# Patient Record
Sex: Female | Born: 1955 | Race: White | Hispanic: No | Smoking: Current every day smoker
Health system: Southern US, Community
[De-identification: ages and names within clinical notes are randomized; demographics above are authoritative.]

## PROBLEM LIST (undated history)

## (undated) DIAGNOSIS — J449 Chronic obstructive pulmonary disease, unspecified: Secondary | ICD-10-CM

## (undated) DIAGNOSIS — E785 Hyperlipidemia, unspecified: Secondary | ICD-10-CM

## (undated) DIAGNOSIS — U071 COVID-19: Secondary | ICD-10-CM

## (undated) DIAGNOSIS — Z8489 Family history of other specified conditions: Secondary | ICD-10-CM

## (undated) DIAGNOSIS — J45909 Unspecified asthma, uncomplicated: Secondary | ICD-10-CM

## (undated) DIAGNOSIS — G709 Myoneural disorder, unspecified: Secondary | ICD-10-CM

## (undated) DIAGNOSIS — I10 Essential (primary) hypertension: Secondary | ICD-10-CM

## (undated) DIAGNOSIS — F419 Anxiety disorder, unspecified: Secondary | ICD-10-CM

## (undated) DIAGNOSIS — F410 Panic disorder [episodic paroxysmal anxiety] without agoraphobia: Secondary | ICD-10-CM

## (undated) DIAGNOSIS — F32A Depression, unspecified: Secondary | ICD-10-CM

## (undated) DIAGNOSIS — M542 Cervicalgia: Secondary | ICD-10-CM

## (undated) DIAGNOSIS — K859 Acute pancreatitis without necrosis or infection, unspecified: Secondary | ICD-10-CM

## (undated) DIAGNOSIS — M1990 Unspecified osteoarthritis, unspecified site: Secondary | ICD-10-CM

## (undated) DIAGNOSIS — K219 Gastro-esophageal reflux disease without esophagitis: Secondary | ICD-10-CM

## (undated) HISTORY — PX: TUBAL LIGATION: SHX77

## (undated) HISTORY — DX: COVID-19: U07.1

## (undated) HISTORY — DX: Unspecified asthma, uncomplicated: J45.909

## (undated) HISTORY — DX: Anxiety disorder, unspecified: F41.9

## (undated) HISTORY — DX: Cervicalgia: M54.2

## (undated) HISTORY — PX: ABDOMINAL HYSTERECTOMY: SHX81

## (undated) HISTORY — DX: Hyperlipidemia, unspecified: E78.5

---

## 2001-02-17 ENCOUNTER — Emergency Department (HOSPITAL_COMMUNITY): Admission: EM | Admit: 2001-02-17 | Discharge: 2001-02-17 | Payer: Self-pay | Admitting: *Deleted

## 2001-02-17 ENCOUNTER — Encounter: Payer: Self-pay | Admitting: *Deleted

## 2002-01-06 DIAGNOSIS — Z90710 Acquired absence of both cervix and uterus: Secondary | ICD-10-CM

## 2002-01-06 HISTORY — DX: Acquired absence of both cervix and uterus: Z90.710

## 2004-03-29 ENCOUNTER — Ambulatory Visit: Payer: Self-pay | Admitting: Family Medicine

## 2004-04-23 ENCOUNTER — Ambulatory Visit: Payer: Self-pay | Admitting: Family Medicine

## 2004-05-07 ENCOUNTER — Ambulatory Visit: Payer: Self-pay | Admitting: Family Medicine

## 2004-11-04 ENCOUNTER — Ambulatory Visit: Payer: Self-pay | Admitting: General Surgery

## 2005-05-08 ENCOUNTER — Ambulatory Visit: Payer: Self-pay | Admitting: General Surgery

## 2005-07-23 IMAGING — US ULTRASOUND RIGHT BREAST
1 series · 17 of 25 positions shown · non-contrast
Comparison: none

REASON FOR EXAM: DENSITY   SEND REPORT TO [facility]
COMMENTS:

[Series 1: ultrasound right breast · 17 of 25 slices shown]
[im 1/25]
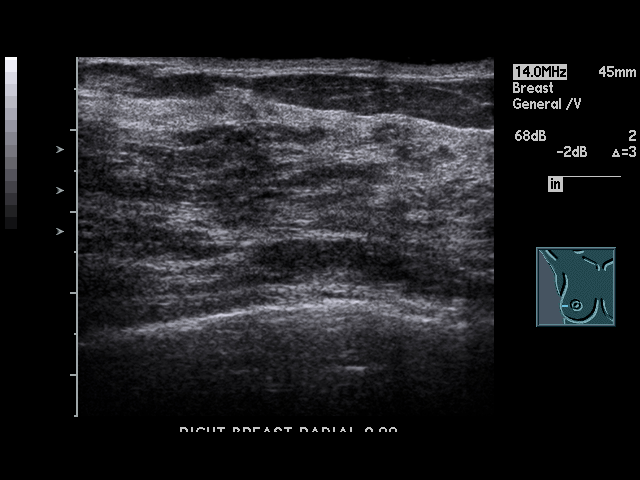
[im 3/25]
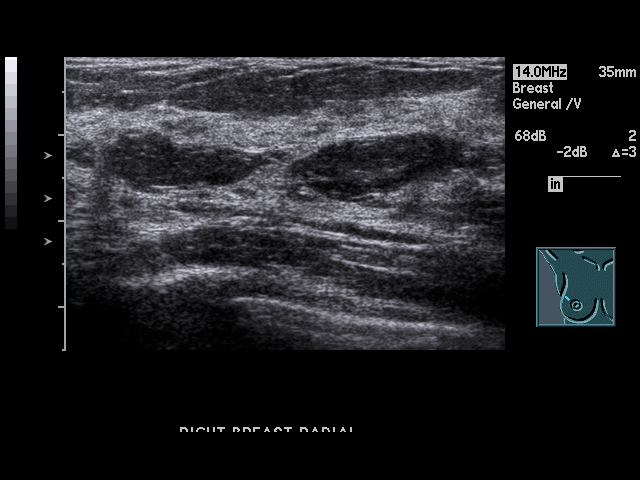
[im 4/25]
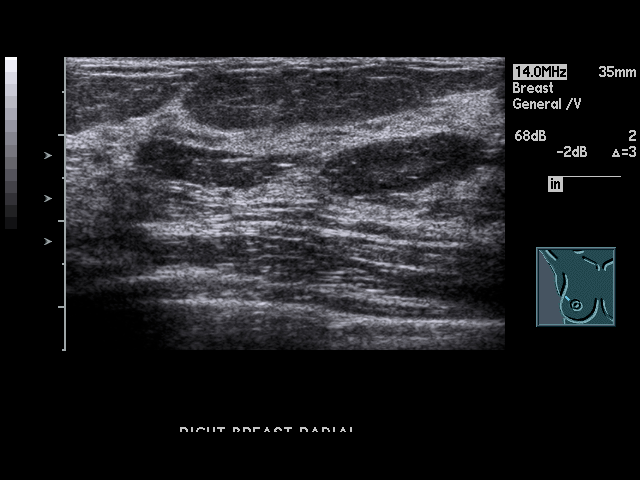
[im 6/25]
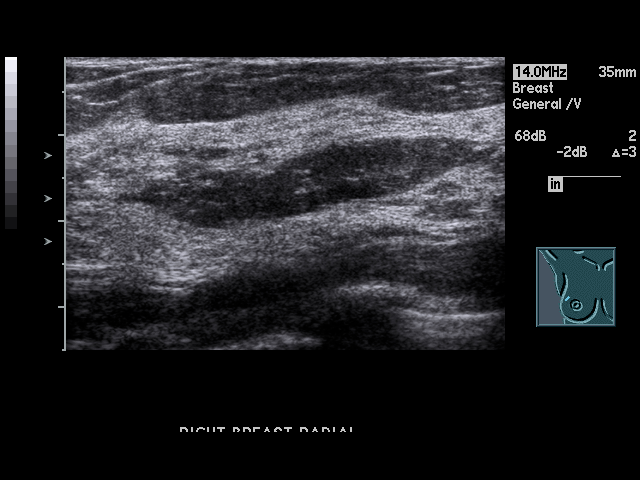
[im 7/25]
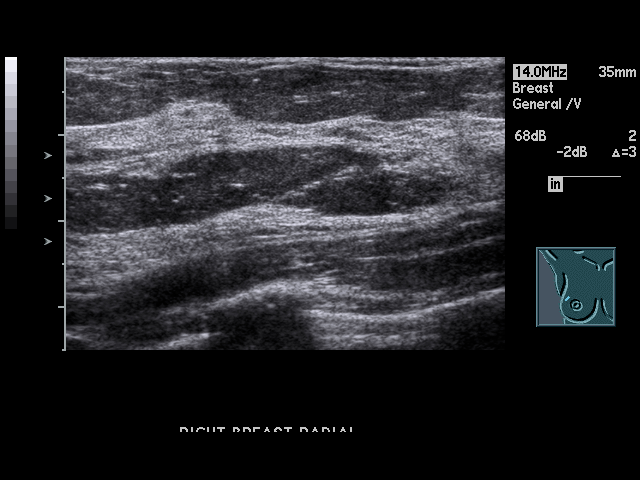
[im 9/25]
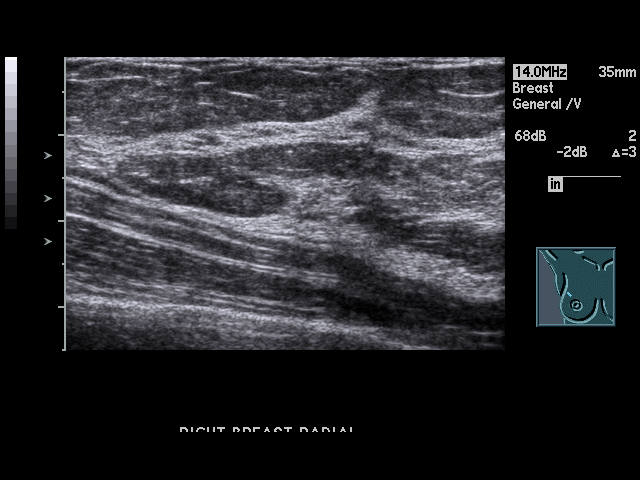
[im 10/25]
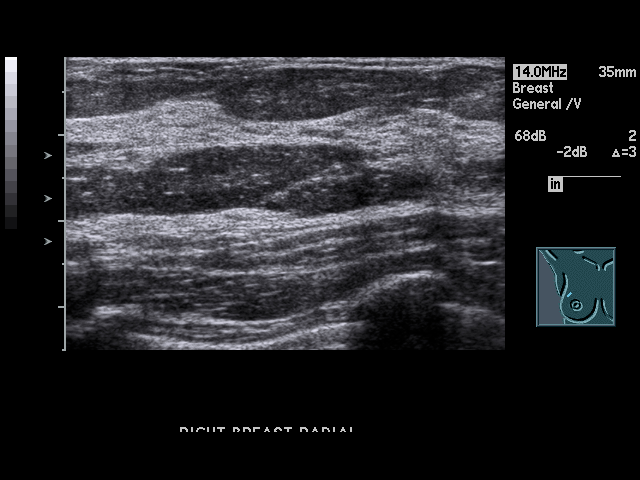
[im 12/25]
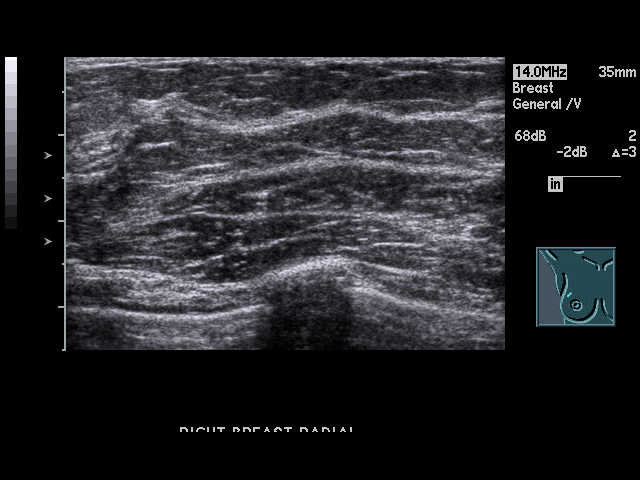
[im 13/25]
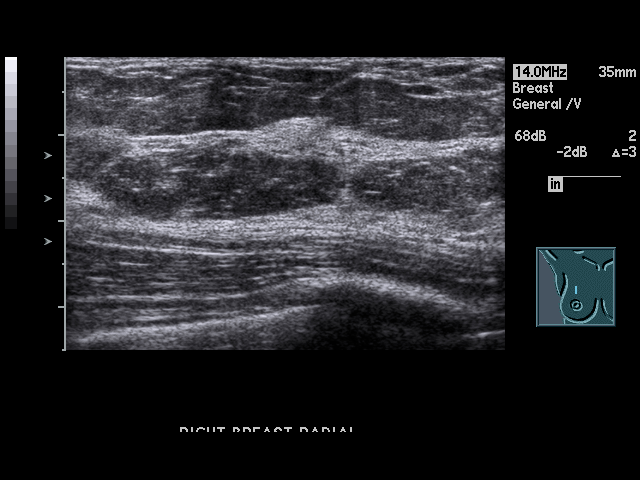
[im 14/25]
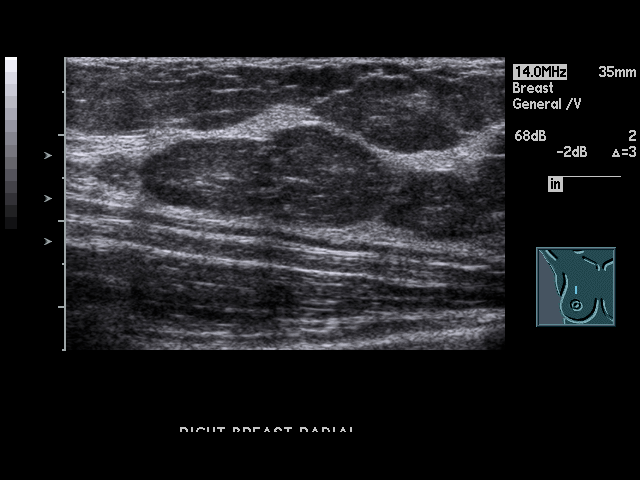
[im 16/25]
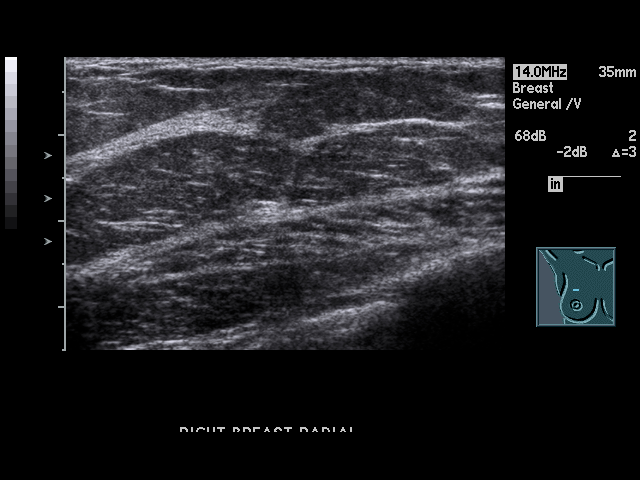
[im 17/25]
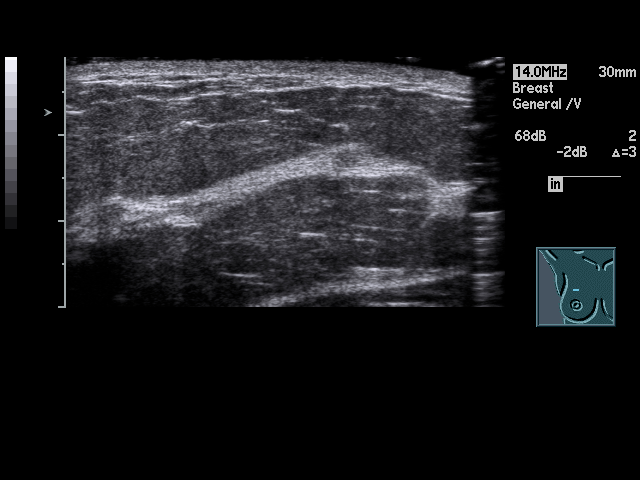
[im 19/25]
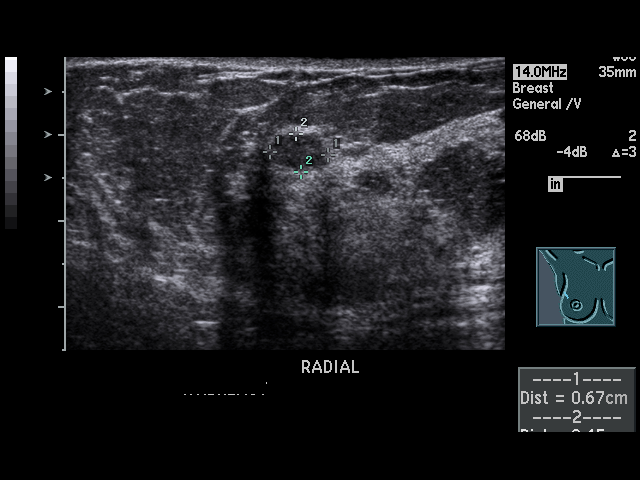
[im 20/25]
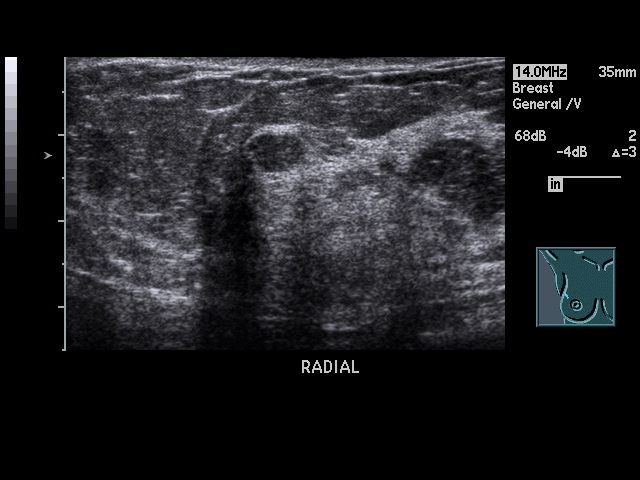
[im 22/25]
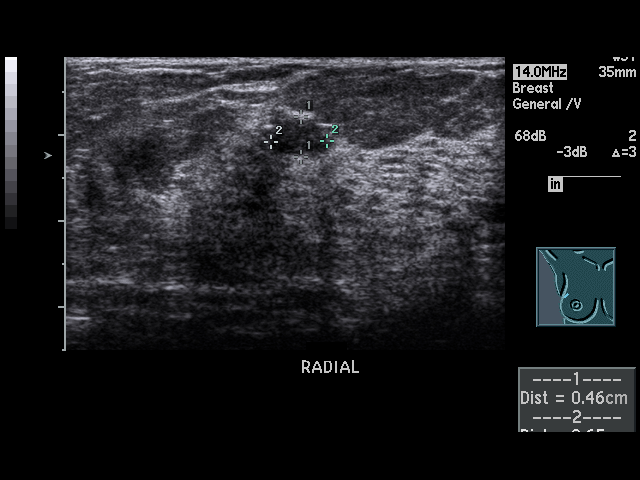
[im 23/25]
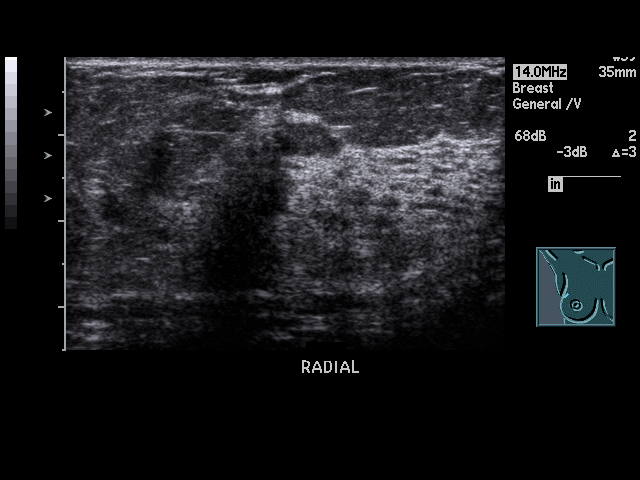
[im 25/25]
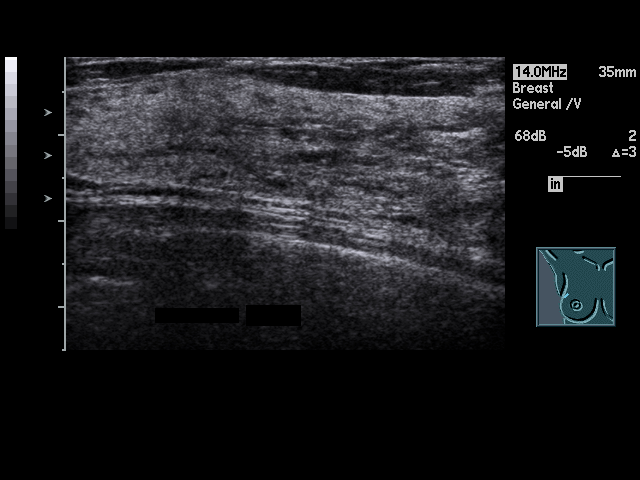

[17 of 25 positions shown; findings below may reference images not displayed]

PROCEDURE:     US  - US DOSIMA RT BREAST  - May 07, 2004 [DATE]

RESULT:     Sonographic evaluation of the RIGHT breast upper outer quadrant
demonstrates a focal hypoechoic relatively smoothly defined area at
approximately the 10 o'clock position measuring 0.67 x 0.46 x 0.65 cm.  The
mass has a slightly elliptical appearance and may represent a benign lesion,
but given the patient's strong family history as well as mammographic
findings surgical consultation with consideration for biopsy would be
recommended.
IMPRESSION: 1)See above.

## 2007-08-12 ENCOUNTER — Emergency Department: Payer: Self-pay | Admitting: Emergency Medicine

## 2008-07-11 ENCOUNTER — Ambulatory Visit: Payer: Self-pay | Admitting: Family Medicine

## 2008-11-02 ENCOUNTER — Ambulatory Visit: Payer: Self-pay | Admitting: Gastroenterology

## 2013-03-29 ENCOUNTER — Ambulatory Visit: Payer: Self-pay | Admitting: Family Medicine

## 2013-03-30 ENCOUNTER — Ambulatory Visit: Payer: Self-pay | Admitting: Family Medicine

## 2013-03-30 LAB — CREATININE, SERUM
Creatinine: 0.75 mg/dL (ref 0.60–1.30)
EGFR (African American): >60
EGFR (Non-African Amer.): >60

## 2013-05-09 ENCOUNTER — Institutional Professional Consult (permissible substitution): Payer: Self-pay | Admitting: Pulmonary Disease

## 2013-07-21 DIAGNOSIS — J449 Chronic obstructive pulmonary disease, unspecified: Secondary | ICD-10-CM | POA: Insufficient documentation

## 2013-07-21 DIAGNOSIS — F17200 Nicotine dependence, unspecified, uncomplicated: Secondary | ICD-10-CM | POA: Insufficient documentation

## 2013-07-21 DIAGNOSIS — R9389 Abnormal findings on diagnostic imaging of other specified body structures: Secondary | ICD-10-CM | POA: Insufficient documentation

## 2013-07-21 DIAGNOSIS — Z720 Tobacco use: Secondary | ICD-10-CM | POA: Insufficient documentation

## 2013-07-21 DIAGNOSIS — J45909 Unspecified asthma, uncomplicated: Secondary | ICD-10-CM | POA: Insufficient documentation

## 2013-09-29 ENCOUNTER — Ambulatory Visit: Payer: Self-pay | Admitting: Family Medicine

## 2013-10-04 ENCOUNTER — Ambulatory Visit: Payer: Self-pay | Admitting: Family Medicine

## 2014-07-14 ENCOUNTER — Other Ambulatory Visit: Payer: Self-pay | Admitting: Emergency Medicine

## 2014-07-14 DIAGNOSIS — I10 Essential (primary) hypertension: Secondary | ICD-10-CM

## 2014-07-14 MED ORDER — LISINOPRIL-HYDROCHLOROTHIAZIDE 20-12.5 MG PO TABS: 1.0000 | ORAL_TABLET | Freq: Every day | ORAL | Status: DC

## 2014-08-21 ENCOUNTER — Ambulatory Visit (INDEPENDENT_AMBULATORY_CARE_PROVIDER_SITE_OTHER): Payer: Self-pay | Admitting: Family Medicine

## 2014-08-21 ENCOUNTER — Encounter: Payer: Self-pay | Admitting: Family Medicine

## 2014-08-21 VITALS — BP 126/82 | HR 78 | Temp 97.9°F | Resp 16 | Ht 66.0 in | Wt 137.2 lb

## 2014-08-21 DIAGNOSIS — Z23 Encounter for immunization: Secondary | ICD-10-CM

## 2014-08-21 DIAGNOSIS — E785 Hyperlipidemia, unspecified: Secondary | ICD-10-CM

## 2014-08-21 DIAGNOSIS — R9389 Abnormal findings on diagnostic imaging of other specified body structures: Secondary | ICD-10-CM

## 2014-08-21 DIAGNOSIS — R938 Abnormal findings on diagnostic imaging of other specified body structures: Secondary | ICD-10-CM

## 2014-08-21 DIAGNOSIS — K589 Irritable bowel syndrome without diarrhea: Secondary | ICD-10-CM

## 2014-08-21 DIAGNOSIS — J449 Chronic obstructive pulmonary disease, unspecified: Secondary | ICD-10-CM

## 2014-08-21 DIAGNOSIS — F17200 Nicotine dependence, unspecified, uncomplicated: Secondary | ICD-10-CM

## 2014-08-21 DIAGNOSIS — F419 Anxiety disorder, unspecified: Secondary | ICD-10-CM

## 2014-08-21 DIAGNOSIS — M5116 Intervertebral disc disorders with radiculopathy, lumbar region: Secondary | ICD-10-CM

## 2014-08-21 MED ORDER — CHOLESTYRAMINE 4 GM/DOSE PO POWD: 1.0000 g | Freq: Three times a day (TID) | ORAL | Status: DC

## 2014-08-21 MED ORDER — HYDROCODONE-ACETAMINOPHEN 7.5-325 MG PO TABS: 1.0000 | ORAL_TABLET | Freq: Three times a day (TID) | ORAL | Status: DC

## 2014-08-21 MED ORDER — ALPRAZOLAM 0.5 MG PO TABS: 0.5000 mg | ORAL_TABLET | Freq: Three times a day (TID) | ORAL | Status: DC

## 2014-08-21 NOTE — Progress Notes (Signed)
Name: Leslie Evans   MRN: [id number]    DOB: 25-Oct-1955   Date:08/21/2014       Progress Note  Subjective  Chief Complaint  Chief Complaint  Patient presents with  . Anxiety    pt here for med refill    Anxiety Presents for follow-up visit. Onset was 1 to 5 years ago. Symptoms include dry mouth, excessive worry, hyperventilation, insomnia, irritability, malaise, muscle tension, nausea, nervous/anxious behavior, obsessions, palpitations, panic and restlessness. Patient reports no chest pain, dizziness, shortness of breath or suicidal ideas. Symptoms occur most days. The severity of symptoms is moderate. The symptoms are aggravated by caffeine, specific phobias, medication withdrawal, social activities and work stress. The quality of sleep is fair. Nighttime awakenings: several.   Risk factors include marital problems and prior traumatic experience. Prior compliance problems include pharmacy issues.  Back Pain This is a chronic problem. The current episode started more than 1 year ago. The problem occurs constantly. The problem has been gradually worsening since onset. The pain is present in the lumbar spine. The quality of the pain is described as stabbing, aching and burning. The pain is moderate. The pain is the same all the time. The symptoms are aggravated by bending, coughing, twisting, stress and position. Associated symptoms include headaches. Pertinent negatives include no chest pain, dysuria, fever, tingling, weakness or weight loss. She has tried muscle relaxant (Narcotic pain meds) for the symptoms. The treatment provided mild relief.  Hypertension This is a chronic problem. The current episode started more than 1 year ago. The problem is controlled. Associated symptoms include anxiety, headaches and palpitations. Pertinent negatives include no blurred vision, chest pain, neck pain, orthopnea or shortness of breath. Risk factors for coronary artery disease include dyslipidemia,  sedentary lifestyle, smoking/tobacco exposure and stress. Past treatments include diuretics.   IBS  Patient has a long-standing history of IBS with abdominal pain and cramping with recurrent diarrhea. She has not responded well to Lomotil and is getting expensive is becoming prohibitive for her. There's been no blood. She's had a colonoscopy as recently as 10 in which was remarkable.   Past Medical History  Diagnosis Date  . Anxiety   . Hyperlipidemia   . Asthma   . Cervicalgia     Social History  Substance Use Topics  . Smoking status: Current Every Day Smoker  . Smokeless tobacco: Not on file     Comment: less than 1/2 pack per day  . Alcohol Use: 0.0 oz/week    0 Standard drinks or equivalent per week     Current outpatient prescriptions:  .  albuterol (PROVENTIL HFA) 108 (90 BASE) MCG/ACT inhaler, two puffs q.4-6h. p.r.n., Disp: , Rfl:  .  ALPRAZolam (XANAX) 0.5 MG tablet, Take by mouth., Disp: , Rfl:  .  budesonide-formoterol (SYMBICORT) 160-4.5 MCG/ACT inhaler, Inhale into the lungs., Disp: , Rfl:  .  cyclobenzaprine (FLEXERIL) 10 MG tablet, Take by mouth., Disp: , Rfl:  .  diphenoxylate-atropine (LOMOTIL) 2.5-0.025 MG per tablet, Take by mouth., Disp: , Rfl:  .  HYDROcodone-acetaminophen (NORCO) 7.5-325 MG per tablet, Take by mouth., Disp: , Rfl:  .  lisinopril-hydrochlorothiazide (ZESTORETIC) 20-12.5 MG per tablet, Take 1 tablet by mouth daily., Disp: 30 tablet, Rfl: 1  Allergies  Allergen Reactions  . Gabapentin Dermatitis  . Pregabalin Other (See Comments)  . Tetracycline Other (See Comments)    Review of Systems  Constitutional: Positive for irritability. Negative for fever, chills and weight loss.  HENT: Negative for congestion, hearing  loss, sore throat and tinnitus.   Eyes: Negative for blurred vision, double vision and redness.  Respiratory: Positive for cough, sputum production and wheezing. Negative for hemoptysis and shortness of breath.    Cardiovascular: Positive for palpitations. Negative for chest pain, orthopnea, claudication and leg swelling.  Gastrointestinal: Positive for nausea and diarrhea. Negative for heartburn, vomiting, constipation and blood in stool.  Genitourinary: Negative for dysuria, urgency, frequency and hematuria.  Musculoskeletal: Positive for back pain and joint pain. Negative for myalgias, falls and neck pain.  Skin: Negative for itching.  Neurological: Positive for headaches. Negative for dizziness, tingling, tremors, focal weakness, seizures, loss of consciousness and weakness.  Endo/Heme/Allergies: Does not bruise/bleed easily.  Psychiatric/Behavioral: Negative for depression, suicidal ideas and substance abuse. The patient is nervous/anxious and has insomnia.      Objective  Filed Vitals:   08/21/14 1128  BP: 126/82  Pulse: 78  Temp: 97.9 F (36.6 C)  Resp: 16  Height: 5\' 6"  (1.676 m)  Weight: 137 lb 3 oz (62.228 kg)  SpO2: 98%     Physical Exam  Constitutional: She is oriented to person, place, and time and well-developed, well-nourished, and in no distress.  HENT:  Head: Normocephalic.  Eyes: EOM are normal. Pupils are equal, round, and reactive to light.  Neck: Normal range of motion. No thyromegaly present.  Cardiovascular: Normal rate, regular rhythm and normal heart sounds.   No murmur heard. Pulmonary/Chest: Effort normal and breath sounds normal.  Abdominal: There is tenderness.  Hyperactive bowel sounds  Musculoskeletal: She exhibits tenderness (lower lumbar area great on the right compared with the left with tenderness over the bursa as well). She exhibits no edema.  Neurological: She is alert and oriented to person, place, and time. No cranial nerve deficit. Gait normal.  Skin: Skin is warm and dry. No rash noted.  Psychiatric: Memory normal.      Assessment & Plan   1. COPD, mild Stable  2. Abnormal CAT scan Follow  3. Compulsive tobacco user  syndrome Encouraged again to stop when she is not motivated  4. IBS (irritable bowel syndrome) 12. Her mean. Wish to give a trial of the Viberzi but cost is prohibitive - cholestyramine (QUESTRAN) 4 GM/DOSE powder; Take 0.5 packets (2 g total) by mouth 3 (three) times daily with meals.  Dispense: 378 g; Refill: 12  5. Chronic anxiety Stable - ALPRAZolam (XANAX) 0.5 MG tablet; Take 1 tablet (0.5 mg total) by mouth 3 (three) times daily.  Dispense: 90 tablet; Refill: 2  6. Lumbar disc disease with radiculopathy Continue pain control - HYDROcodone-acetaminophen (NORCO) 7.5-325 MG per tablet; Take 1 tablet by mouth 3 (three) times daily.  Dispense: 90 tablet; Refill: 0  7. Hyperlipidemia Needs lab - Comprehensive Metabolic Panel (CMET) - Lipid Profile - TSH

## 2014-10-11 ENCOUNTER — Other Ambulatory Visit: Payer: Self-pay | Admitting: Family Medicine

## 2014-10-11 NOTE — Telephone Encounter (Signed)
Patient requesting refill. 

## 2014-10-25 ENCOUNTER — Telehealth: Payer: Self-pay | Admitting: Family Medicine

## 2014-10-25 NOTE — Telephone Encounter (Signed)
ERRENOUS °

## 2014-10-27 ENCOUNTER — Encounter: Payer: Self-pay | Admitting: Family Medicine

## 2014-10-27 ENCOUNTER — Ambulatory Visit (INDEPENDENT_AMBULATORY_CARE_PROVIDER_SITE_OTHER): Payer: No Typology Code available for payment source | Admitting: Family Medicine

## 2014-10-27 VITALS — BP 120/72 | HR 76 | Temp 98.7°F | Resp 16 | Ht 66.0 in | Wt 139.9 lb

## 2014-10-27 DIAGNOSIS — B49 Unspecified mycosis: Secondary | ICD-10-CM

## 2014-10-27 DIAGNOSIS — K589 Irritable bowel syndrome without diarrhea: Secondary | ICD-10-CM

## 2014-10-27 DIAGNOSIS — B370 Candidal stomatitis: Secondary | ICD-10-CM

## 2014-10-27 MED ORDER — TERBINAFINE HCL 250 MG PO TABS: 250.0000 mg | ORAL_TABLET | Freq: Every day | ORAL | Status: DC

## 2014-10-27 MED ORDER — DIPHENOXYLATE-ATROPINE 2.5-0.025 MG PO TABS: 1.0000 | ORAL_TABLET | Freq: Four times a day (QID) | ORAL | Status: DC | PRN

## 2014-10-27 NOTE — Progress Notes (Signed)
Name: Leslie Evans   MRN: [id number]    DOB: September 21, 1955   Date:10/27/2014       Progress Note  Subjective  Chief Complaint  Chief Complaint  Patient presents with  . Oral Pain     Thrush for 4 days, dry and painful  . Irritable Bowel Syndrome    HPI  Bowel syndrome  Symptomatology is worsening of recent. She has been out of her Lomotil. She has recurrent diarrhea multiple times per day and has already had 3 loose stool as of this morning. There is no blood or mucus.   Candidiasis-oral  Patient complains of thrush for 4 days which is painful. She finds her hips and dry during the course she's slightly. Also has some symptoms.  Past Medical History  Diagnosis Date  . Anxiety   . Hyperlipidemia   . Asthma   . Cervicalgia     Social History  Substance Use Topics  . Smoking status: Current Every Day Smoker  . Smokeless tobacco: Not on file     Comment: less than 1/2 pack per day  . Alcohol Use: 0.0 oz/week    0 Standard drinks or equivalent per week     Current outpatient prescriptions:  .  albuterol (PROVENTIL HFA) 108 (90 BASE) MCG/ACT inhaler, two puffs q.4-6h. p.r.n., Disp: , Rfl:  .  ALPRAZolam (XANAX) 0.5 MG tablet, Take 1 tablet (0.5 mg total) by mouth 3 (three) times daily., Disp: 90 tablet, Rfl: 2 .  budesonide-formoterol (SYMBICORT) 160-4.5 MCG/ACT inhaler, Inhale into the lungs., Disp: , Rfl:  .  cholestyramine (QUESTRAN) 4 GM/DOSE powder, Take 0.5 packets (2 g total) by mouth 3 (three) times daily with meals., Disp: 378 g, Rfl: 12 .  cyclobenzaprine (FLEXERIL) 10 MG tablet, Take by mouth., Disp: , Rfl:  .  diphenoxylate-atropine (LOMOTIL) 2.5-0.025 MG per tablet, Take by mouth., Disp: , Rfl:  .  HYDROcodone-acetaminophen (NORCO) 7.5-325 MG per tablet, Take 1 tablet by mouth 3 (three) times daily., Disp: 90 tablet, Rfl: 0 .  lisinopril-hydrochlorothiazide (ZESTORETIC) 20-12.5 MG per tablet, Take 1 tablet by mouth daily., Disp: 30 tablet, Rfl: 1  Allergies   Allergen Reactions  . Gabapentin Dermatitis  . Pregabalin Other (See Comments)  . Tetracycline Other (See Comments)    Review of Systems  Constitutional: Negative for fever, chills and weight loss.  HENT: Negative for congestion, hearing loss, sore throat and tinnitus.        Oral Pain and thrush  Eyes: Negative for blurred vision, double vision and redness.  Respiratory: Negative for cough, hemoptysis and shortness of breath.   Cardiovascular: Negative for chest pain, palpitations, orthopnea, claudication and leg swelling.  Gastrointestinal: Positive for abdominal pain and constipation. Negative for heartburn, nausea, vomiting, diarrhea and blood in stool.  Genitourinary: Negative for dysuria, urgency, frequency and hematuria.  Musculoskeletal: Negative for myalgias, back pain, joint pain, falls and neck pain.  Skin: Negative for itching.  Neurological: Negative for dizziness, tingling, tremors, focal weakness, seizures, loss of consciousness, weakness and headaches.  Endo/Heme/Allergies: Does not bruise/bleed easily.  Psychiatric/Behavioral: Negative for depression and substance abuse. The patient is not nervous/anxious and does not have insomnia.      Objective  Filed Vitals:   10/27/14 0743  BP: 120/72  Pulse: 76  Temp: 98.7 F (37.1 C)  TempSrc: Oral  Resp: 16  Height: 5\' 6"  (1.676 m)  Weight: 139 lb 14.4 oz (63.458 kg)  SpO2: 97%     Physical Exam  Constitutional: She is  oriented to person, place, and time and well-developed, well-nourished, and in no distress.  HENT:  Head: Normocephalic.  There is CHELOSIS and chafing of the lips.    There is a small amount of plaquing on roof of the mouth with a few yeast forms noted on microscopic exam   Eyes: EOM are normal. Pupils are equal, round, and reactive to light.  Neck: Normal range of motion. No thyromegaly present.  Cardiovascular: Normal rate, regular rhythm and normal heart sounds.   No murmur  heard. Pulmonary/Chest: Effort normal and breath sounds normal.  Abdominal: Soft. There is tenderness.  Hyperactive bowel sounds  Musculoskeletal: Normal range of motion. She exhibits no edema.  Neurological: She is alert and oriented to person, place, and time. No cranial nerve deficit. Gait normal.  Skin: Skin is warm and dry. No rash noted.  Psychiatric: Memory and affect normal.      Assessment & Plan  1. Oral candidiasis Treatment medications - terbinafine (LAMISIL) 250 MG tablet; Take 1 tablet (250 mg total) by mouth daily.  Dispense: 10 tablet; Refill: 0  2. Fungal infection Treatment with oral Lamisil - terbinafine (LAMISIL) 250 MG tablet; Take 1 tablet (250 mg total) by mouth daily.  Dispense: 10 tablet; Refill: 0  3. IBS (irritable bowel syndrome) Renew Lomotil. Dietary adjustments as needed consider using a probiotic regular basis - diphenoxylate-atropine (LOMOTIL) 2.5-0.025 MG tablet; Take 1 tablet by mouth 4 (four) times daily as needed for diarrhea or loose stools.  Dispense: 60 tablet; Refill: 5

## 2014-11-02 ENCOUNTER — Ambulatory Visit (INDEPENDENT_AMBULATORY_CARE_PROVIDER_SITE_OTHER): Payer: Self-pay | Admitting: Family Medicine

## 2014-11-02 ENCOUNTER — Encounter: Payer: Self-pay | Admitting: Family Medicine

## 2014-11-02 ENCOUNTER — Telehealth: Payer: Self-pay | Admitting: Family Medicine

## 2014-11-02 VITALS — BP 128/82 | HR 96 | Temp 98.8°F | Resp 16 | Ht 66.0 in | Wt 140.0 lb

## 2014-11-02 DIAGNOSIS — K130 Diseases of lips: Secondary | ICD-10-CM

## 2014-11-02 MED ORDER — RANITIDINE HCL 150 MG PO TABS: 150.0000 mg | ORAL_TABLET | Freq: Two times a day (BID) | ORAL | Status: DC

## 2014-11-02 MED ORDER — DIPHENHYDRAMINE HCL 50 MG PO TABS: 50.0000 mg | ORAL_TABLET | Freq: Three times a day (TID) | ORAL | Status: DC | PRN

## 2014-11-02 NOTE — Telephone Encounter (Signed)
Notified patient work note is ready for pickup at front desk

## 2014-11-02 NOTE — Progress Notes (Signed)
Name: Leslie Evans   MRN: [id number]    DOB: 1955/03/09   Date:11/02/2014       Progress Note  Subjective  Chief Complaint  Chief Complaint  Patient presents with  . Leslie Evans    pt was here on 10/28/14 and states that her symptoms have gotten worse.    HPI  Swelling of Lips and Mouth Pt. With swelling of lips and oral cavity. She was seen for oral candidiasis and was prescribed Lamisil tablets. She started taking Lamisil tablets last weekend on Saturday 10/22 and noticed that her lips started swelling since Tued[place] 10/25.   Past Medical History  Diagnosis Date  . Anxiety   . Hyperlipidemia   . Asthma   . Cervicalgia     No past surgical history on file.  Family History  Problem Relation Age of Onset  . Cancer Mother   . Diabetes Mother   . Heart disease Mother   . Cancer Father     Social History   Social History  . Marital Status: Divorced    Spouse Name: N/A  . Number of Children: N/A  . Years of Education: N/A   Occupational History  . Not on file.   Social History Main Topics  . Smoking status: Current Every Day Smoker  . Smokeless tobacco: Not on file     Comment: less than 1/2 pack per day  . Alcohol Use: 0.0 oz/week    0 Standard drinks or equivalent per week  . Drug Use: No  . Sexual Activity: Not Currently   Other Topics Concern  . Not on file   Social History Narrative     Current outpatient prescriptions:  .  albuterol (PROVENTIL HFA) 108 (90 BASE) MCG/ACT inhaler, two puffs q.4-6h. p.r.n., Disp: , Rfl:  .  ALPRAZolam (XANAX) 0.5 MG tablet, Take 1 tablet (0.5 mg total) by mouth 3 (three) times daily., Disp: 90 tablet, Rfl: 2 .  budesonide-formoterol (SYMBICORT) 160-4.5 MCG/ACT inhaler, Inhale into the lungs., Disp: , Rfl:  .  cholestyramine (QUESTRAN) 4 GM/DOSE powder, Take 0.5 packets (2 g total) by mouth 3 (three) times daily with meals., Disp: 378 g, Rfl: 12 .  cyclobenzaprine (FLEXERIL) 10 MG tablet, Take by mouth., Disp: , Rfl:  .   diphenoxylate-atropine (LOMOTIL) 2.5-0.025 MG tablet, Take 1 tablet by mouth 4 (four) times daily as needed for diarrhea or loose stools., Disp: 60 tablet, Rfl: 5 .  HYDROcodone-acetaminophen (NORCO) 7.5-325 MG per tablet, Take 1 tablet by mouth 3 (three) times daily., Disp: 90 tablet, Rfl: 0 .  lisinopril-hydrochlorothiazide (ZESTORETIC) 20-12.5 MG per tablet, Take 1 tablet by mouth daily., Disp: 30 tablet, Rfl: 1 .  terbinafine (LAMISIL) 250 MG tablet, Take 1 tablet (250 mg total) by mouth daily., Disp: 10 tablet, Rfl: 0  Allergies  Allergen Reactions  . Gabapentin Dermatitis  . Pregabalin Other (See Comments)  . Tetracycline Other (See Comments)     Review of Systems  Constitutional: Negative for fever, chills, weight loss and malaise/fatigue.   Objective  Filed Vitals:   11/02/14 1119  BP: 128/82  Pulse: 96  Temp: 98.8 F (37.1 C)  Resp: 16  Height: 5\' 6"  (1.676 m)  Weight: 140 lb (63.504 kg)  SpO2: 99%    Physical Exam  Constitutional: She is well-developed, well-nourished, and in no distress.  HENT:  Head: Normocephalic.  Mouth/Throat: Posterior oropharyngeal erythema present.  Inflammation of the lips, Mild tenderness to palpation, no discharge visible.  Cardiovascular: Normal rate and regular  rhythm.   Pulmonary/Chest: Breath sounds normal.  Nursing note and vitals reviewed.    Assessment & Plan  1. Inflammation of lips Most likely an allergic reaction to a recently started medication. Differential diagnosis includes angioedema (unlikely). Will start on histamine antagonist and obtain lab work including complement studies. Patient asked to seek immediate medical attention if she notices swelling of the oral cavity or the tongue or if she has any other concerning symptoms. Patient verbalized understanding.if symptoms do not improve on histamine antagonists, we'll consider prednisone therapy.   - diphenhydrAMINE (BENADRYL) 50 MG tablet; Take 1 tablet (50 mg  total) by mouth every 8 (eight) hours as needed for itching.  Dispense: 15 tablet; Refill: 0 - ranitidine (ZANTAC) 150 MG tablet; Take 1 tablet (150 mg total) by mouth 2 (two) times daily.  Dispense: 10 tablet; Refill: 0 - CBC with Differential - Sed Rate (ESR) - Antinuclear Antib (ANA) - C1 Esterase Inhibitor, Functional - C4 complement   Leslie Evans [name] 11/02/2014 12:13 PM

## 2014-11-02 NOTE — Telephone Encounter (Signed)
Was seen today by dr Manuella Ghazi and forgot to mention that she is needing a note for work stating that she was seen today and will not be able to return on 11-03-14.

## 2014-11-13 ENCOUNTER — Ambulatory Visit: Payer: Self-pay | Admitting: Family Medicine

## 2014-11-27 ENCOUNTER — Ambulatory Visit (INDEPENDENT_AMBULATORY_CARE_PROVIDER_SITE_OTHER): Payer: No Typology Code available for payment source | Admitting: Family Medicine

## 2014-11-27 ENCOUNTER — Encounter: Payer: Self-pay | Admitting: Family Medicine

## 2014-11-27 VITALS — BP 120/70 | HR 73 | Temp 98.1°F | Resp 14 | Ht 66.0 in | Wt 141.7 lb

## 2014-11-27 DIAGNOSIS — M5116 Intervertebral disc disorders with radiculopathy, lumbar region: Secondary | ICD-10-CM

## 2014-11-27 DIAGNOSIS — F419 Anxiety disorder, unspecified: Secondary | ICD-10-CM

## 2014-11-27 DIAGNOSIS — I10 Essential (primary) hypertension: Secondary | ICD-10-CM

## 2014-11-27 DIAGNOSIS — K589 Irritable bowel syndrome without diarrhea: Secondary | ICD-10-CM

## 2014-11-27 DIAGNOSIS — J449 Chronic obstructive pulmonary disease, unspecified: Secondary | ICD-10-CM

## 2014-11-27 MED ORDER — HYDROCODONE-ACETAMINOPHEN 7.5-325 MG PO TABS: 1.0000 | ORAL_TABLET | Freq: Three times a day (TID) | ORAL | Status: DC

## 2014-11-27 MED ORDER — CYCLOBENZAPRINE HCL 10 MG PO TABS: 10.0000 mg | ORAL_TABLET | Freq: Two times a day (BID) | ORAL | Status: DC

## 2014-11-27 MED ORDER — DIPHENOXYLATE-ATROPINE 2.5-0.025 MG PO TABS: 1.0000 | ORAL_TABLET | Freq: Four times a day (QID) | ORAL | Status: DC | PRN

## 2014-11-27 MED ORDER — ALPRAZOLAM 0.5 MG PO TABS
0.5000 mg | ORAL_TABLET | Freq: Three times a day (TID) | ORAL | Status: DC
Start: 2014-11-27 — End: 2015-02-27

## 2014-11-27 MED ORDER — LISINOPRIL-HYDROCHLOROTHIAZIDE 20-12.5 MG PO TABS: 1.0000 | ORAL_TABLET | Freq: Every day | ORAL | Status: DC

## 2014-11-27 NOTE — Progress Notes (Signed)
Name: Leslie Evans   MRN: [id number]    DOB: 10-23-1955   Date:11/27/2014       Progress Note  Subjective  Chief Complaint  Chief Complaint  Patient presents with  . Anxiety    3 month follow up  . Hypertension  . Back Pain    follow up pain medication    HPI  Hypertension   Patient presents for follow-up of hypertension. It has been present for over 5 years.  Patient states that there is compliance with medical regimen which consists of lisinopril HCT 20-12 0.5 . There is no end organ disease. Cardiac risk factors include hypertension hyperlipidemia and diabetes.  Exercise regimen consist of some walking .  Diet consist of restricted sodium .  Chronic pain  Patient presents for follow-up of chronic pain syndrome. The pain score at worse is -/10  and at best is-/10 with medication rest and home remedies.  There is no evidence of any extra dosage or issues of usage outside of the prescribed regimen.  Pain is exacerbated by changes in weather and by strenuous activities. There have been no recent activities to exacerbate the chronic pain. Concomitant medications include Norco 7. 05/09/2023 3 times a day cyclobenzaprine 10 mg twice a day .  Drug screen and medication contract have been renewed within the last 1 year.    Anxiety disorder patient has long-standing history of anxiety is currently on Xanax 0.5 mg 3 times a day. This is been working well for her. Her symptoms  include racing thoughts insomnia sweaty palms racing heart  COPD.  Patient continues to smoke despite warnings contrary.  He remains on a regimen of Symbicort twice a day and uses albuterol when necessary basis. She has some dystrophy exertion and cough intermittently. No hemoptysis no weight loss.  IBS  Patient has a long-standing history of irritable bowel syndrome with primary diarrheal component. She has chronic abdominal pain. Her weight has recently been stable however. There is no melena or hematochezia. She  has colonoscopy history and 1010 which was normal.  Past Medical History  Diagnosis Date  . Anxiety   . Hyperlipidemia   . Asthma   . Cervicalgia     Social History  Substance Use Topics  . Smoking status: Current Every Day Smoker  . Smokeless tobacco: Not on file     Comment: less than 1/2 pack per day  . Alcohol Use: 0.0 oz/week    0 Standard drinks or equivalent per week     Current outpatient prescriptions:  .  albuterol (PROVENTIL HFA) 108 (90 BASE) MCG/ACT inhaler, two puffs q.4-6h. p.r.n., Disp: , Rfl:  .  ALPRAZolam (XANAX) 0.5 MG tablet, Take 1 tablet (0.5 mg total) by mouth 3 (three) times daily., Disp: 90 tablet, Rfl: 2 .  budesonide-formoterol (SYMBICORT) 160-4.5 MCG/ACT inhaler, Inhale into the lungs., Disp: , Rfl:  .  cyclobenzaprine (FLEXERIL) 10 MG tablet, Take 1 tablet (10 mg total) by mouth 2 (two) times daily., Disp: 60 tablet, Rfl: 5 .  diphenhydrAMINE (BENADRYL) 50 MG tablet, Take 1 tablet (50 mg total) by mouth every 8 (eight) hours as needed for itching., Disp: 15 tablet, Rfl: 0 .  diphenoxylate-atropine (LOMOTIL) 2.5-0.025 MG tablet, Take 1 tablet by mouth 4 (four) times daily as needed for diarrhea or loose stools., Disp: 60 tablet, Rfl: 5 .  HYDROcodone-acetaminophen (NORCO) 7.5-325 MG per tablet, Take 1 tablet by mouth 3 (three) times daily., Disp: 90 tablet, Rfl: 0 .  ranitidine (ZANTAC) 150  MG tablet, Take 1 tablet (150 mg total) by mouth 2 (two) times daily., Disp: 10 tablet, Rfl: 0 .  lisinopril-hydrochlorothiazide (ZESTORETIC) 20-12.5 MG tablet, Take 1 tablet by mouth daily., Disp: 90 tablet, Rfl: 1  Allergies  Allergen Reactions  . Gabapentin Dermatitis  . Lamisil [Terbinafine Hcl] Swelling  . Pregabalin Other (See Comments)  . Tetracycline Other (See Comments)    Review of Systems  Constitutional: Positive for malaise/fatigue. Negative for fever, chills and weight loss.  HENT: Negative for congestion, hearing loss, sore throat and tinnitus.    Eyes: Negative for blurred vision, double vision and redness.  Respiratory: Positive for cough and sputum production. Negative for hemoptysis and shortness of breath.   Cardiovascular: Positive for chest pain. Negative for palpitations, orthopnea, claudication and leg swelling.  Gastrointestinal: Positive for abdominal pain and diarrhea. Negative for heartburn, nausea, vomiting, constipation and blood in stool.  Genitourinary: Negative for dysuria, urgency, frequency and hematuria.  Musculoskeletal: Positive for back pain, joint pain and neck pain. Negative for myalgias and falls.  Skin: Negative for itching.  Neurological: Negative for dizziness, tingling, tremors, focal weakness, seizures, loss of consciousness, weakness and headaches.  Endo/Heme/Allergies: Does not bruise/bleed easily.  Psychiatric/Behavioral: Negative for depression and substance abuse. The patient is nervous/anxious and has insomnia.        Objective  Filed Vitals:   11/27/14 0906  BP: 120/70  Pulse: 73  Temp: 98.1 F (36.7 C)  TempSrc: Oral  Resp: 14  Height: 5\' 6"  (1.676 m)  Weight: 141 lb 11.2 oz (64.275 kg)  SpO2: 96%     Physical Exam  Constitutional: She is oriented to person, place, and time and well-developed, well-nourished, and in no distress.  HENT:  Head: Normocephalic.  Eyes: EOM are normal. Pupils are equal, round, and reactive to light.  Neck: Normal range of motion. No thyromegaly present.  Cardiovascular: Normal rate, regular rhythm and normal heart sounds.   No murmur heard. Pulmonary/Chest: Effort normal.  Slightly diminished breath sounds throughout  Abdominal: Soft. Bowel sounds are normal.  Musculoskeletal: Normal range of motion. She exhibits tenderness. She exhibits no edema.  Neurological: She is alert and oriented to person, place, and time. No cranial nerve deficit. Gait normal.  Skin: Skin is warm and dry. No rash noted.  Psychiatric: Memory normal.  Anxious and  loquacious      Assessment & Plan

## 2015-02-08 ENCOUNTER — Telehealth: Payer: Self-pay | Admitting: Family Medicine

## 2015-02-08 DIAGNOSIS — I10 Essential (primary) hypertension: Secondary | ICD-10-CM

## 2015-02-08 MED ORDER — LISINOPRIL-HYDROCHLOROTHIAZIDE 20-12.5 MG PO TABS: 1.0000 | ORAL_TABLET | Freq: Every day | ORAL | Status: DC

## 2015-02-08 NOTE — Telephone Encounter (Signed)
Left voice message to inform patient that prescription is at the pharmacy

## 2015-02-08 NOTE — Telephone Encounter (Signed)
Patient is requesting a refill on lisinopril hctz. Please send to [facility]. She will call back to schedule her appointment for March

## 2015-02-08 NOTE — Telephone Encounter (Signed)
Medication has been sent to pharmacy pt requested

## 2015-02-27 ENCOUNTER — Telehealth: Payer: Self-pay | Admitting: Family Medicine

## 2015-02-27 DIAGNOSIS — F419 Anxiety disorder, unspecified: Secondary | ICD-10-CM

## 2015-02-27 MED ORDER — ALPRAZOLAM 0.5 MG PO TABS: 0.5000 mg | ORAL_TABLET | Freq: Three times a day (TID) | ORAL | Status: DC

## 2015-02-27 NOTE — Telephone Encounter (Signed)
Refill called in to pharmacy

## 2015-02-27 NOTE — Telephone Encounter (Signed)
Requesting to pick up a prescription for Xanex. She did schedule appointment for April 12, 2015

## 2015-02-28 NOTE — Telephone Encounter (Signed)
Patient informed. 

## 2015-03-22 ENCOUNTER — Other Ambulatory Visit: Payer: Self-pay | Admitting: Family Medicine

## 2015-03-22 ENCOUNTER — Other Ambulatory Visit: Payer: Self-pay

## 2015-03-22 DIAGNOSIS — F419 Anxiety disorder, unspecified: Secondary | ICD-10-CM

## 2015-03-22 DIAGNOSIS — M5116 Intervertebral disc disorders with radiculopathy, lumbar region: Secondary | ICD-10-CM

## 2015-03-22 MED ORDER — ALPRAZOLAM 0.5 MG PO TABS: 0.5000 mg | ORAL_TABLET | Freq: Three times a day (TID) | ORAL | Status: DC

## 2015-03-22 MED ORDER — CYCLOBENZAPRINE HCL 10 MG PO TABS: 10.0000 mg | ORAL_TABLET | Freq: Two times a day (BID) | ORAL | Status: DC

## 2015-03-22 MED ORDER — HYDROCODONE-ACETAMINOPHEN 7.5-325 MG PO TABS: 1.0000 | ORAL_TABLET | Freq: Three times a day (TID) | ORAL | Status: DC

## 2015-03-23 ENCOUNTER — Other Ambulatory Visit: Payer: Self-pay

## 2015-03-23 DIAGNOSIS — K589 Irritable bowel syndrome without diarrhea: Secondary | ICD-10-CM

## 2015-03-23 MED ORDER — DIPHENOXYLATE-ATROPINE 2.5-0.025 MG PO TABS: 1.0000 | ORAL_TABLET | Freq: Four times a day (QID) | ORAL | Status: DC | PRN

## 2015-04-12 ENCOUNTER — Ambulatory Visit: Payer: Self-pay | Admitting: Family Medicine

## 2015-06-18 ENCOUNTER — Other Ambulatory Visit: Payer: Self-pay | Admitting: Family Medicine

## 2015-07-02 ENCOUNTER — Ambulatory Visit: Payer: Self-pay | Admitting: Family Medicine

## 2015-07-16 ENCOUNTER — Ambulatory Visit (INDEPENDENT_AMBULATORY_CARE_PROVIDER_SITE_OTHER): Payer: Self-pay | Admitting: Family Medicine

## 2015-07-16 ENCOUNTER — Encounter: Payer: Self-pay | Admitting: Family Medicine

## 2015-07-16 VITALS — BP 118/78 | HR 71 | Temp 98.1°F | Resp 16 | Wt 137.0 lb

## 2015-07-16 DIAGNOSIS — J449 Chronic obstructive pulmonary disease, unspecified: Secondary | ICD-10-CM

## 2015-07-16 DIAGNOSIS — I10 Essential (primary) hypertension: Secondary | ICD-10-CM

## 2015-07-16 DIAGNOSIS — Z5181 Encounter for therapeutic drug level monitoring: Secondary | ICD-10-CM

## 2015-07-16 DIAGNOSIS — F419 Anxiety disorder, unspecified: Secondary | ICD-10-CM

## 2015-07-16 DIAGNOSIS — E785 Hyperlipidemia, unspecified: Secondary | ICD-10-CM

## 2015-07-16 DIAGNOSIS — K589 Irritable bowel syndrome without diarrhea: Secondary | ICD-10-CM

## 2015-07-16 DIAGNOSIS — Z720 Tobacco use: Secondary | ICD-10-CM

## 2015-07-16 MED ORDER — ALBUTEROL SULFATE HFA 108 (90 BASE) MCG/ACT IN AERS: 2.0000 | INHALATION_SPRAY | RESPIRATORY_TRACT | Status: DC | PRN

## 2015-07-16 MED ORDER — LISINOPRIL-HYDROCHLOROTHIAZIDE 20-12.5 MG PO TABS: 1.0000 | ORAL_TABLET | Freq: Every day | ORAL | Status: DC

## 2015-07-16 MED ORDER — BUDESONIDE-FORMOTEROL FUMARATE 160-4.5 MCG/ACT IN AERO: 2.0000 | INHALATION_SPRAY | Freq: Two times a day (BID) | RESPIRATORY_TRACT | Status: DC

## 2015-07-16 NOTE — Assessment & Plan Note (Signed)
Avoid salt substitutes, avoid potassium chloride; check creatinine and potassium; encouraged DASH guidelines; smoking cessation

## 2015-07-16 NOTE — Assessment & Plan Note (Signed)
Encouraged patient quit smoking

## 2015-07-16 NOTE — Assessment & Plan Note (Signed)
Check labs 

## 2015-07-16 NOTE — Patient Instructions (Addendum)
Try a low FODMAP diet Start to taper down your alprazolam; we won't be able to prescribe any additional alprazolam for you here I'll encourage you to get into counseling Check out [employer] and you can search for counselors in the area who handle anxiety  Your goal blood pressure is less than 140 mmHg on top. Try to follow the DASH guidelines (DASH stands for Dietary Approaches to Stop Hypertension) Try to limit the sodium in your diet.  Ideally, consume less than 1.5 grams (less than 1,500mg ) per day. Do not add salt when cooking or at the table.  Check the sodium amount on labels when shopping, and choose items lower in sodium when given a choice. Avoid or limit foods that already contain a lot of sodium. Eat a diet rich in fruits and vegetables and whole grains.  Let's get labs If you have not heard anything from my staff in a week about any orders/referrals/studies from today, please contact us here to follow-up [phone]

## 2015-07-16 NOTE — Assessment & Plan Note (Signed)
Smoking cessation stressed; refill symbicort

## 2015-07-16 NOTE — Progress Notes (Signed)
BP 118/78   Pulse 71   Temp 98.1 F (36.7 C) (Oral)   Resp 16   Wt 137 lb (62.1 kg)   SpO2 96%   BMI 22.11 kg/m    Subjective:    Patient ID: Leslie Evans, female    DOB: [birth year], 60 y.o.   MRN: [id number]  HPI: Leslie Evans is a 60 y.o. female  Chief Complaint  Patient presents with  . Medication Refill   She is needing medication refills  She takes lomotil; she says a few years ago, she got really sick; drove her to the hospital; it was over a week that she was like this; rectal exam was positive for bacterial intestinal infection; it was a severe case, did two rounds of antibiotics; her intestinal system has not been the same since; she has not had a solid BM since then; she has urgency, blow-outs; has not seen GI; always runny and liquidy; lots of abd cramping pain, constantly; she has bottle of Lomotil with more than 30 pills in the bottle; date on bottle is 04/16/15; not noticing any benefit  She takes cyclobenzaprine; she had an injury from work in 2006; still having muscle spasms; went to physical therapy; she does stretches, but not strenuous exercises; she couldn't squat for almost a year; went to PT in [place]; had two MRIs; has pain in the groin and the leg; makes her foot numb; knots up in the right side of the buttock; has bottle of cyclobenzaprine filled 04/16/15; does not take it every single day, maybe 2-3 x a week  She takes alprazolam; she has anxiety and panic issues unbelievable; he nerves are all over the place; last period was 2004; has been on the alprazolam for quite a few years; she just needed something for her brain would shut off; feels like she is humming all the time; gets upset very easily; has panic attacks, had one in the office a few years ago when Butch Penny was here; not seeing any psychologist or psychiatrist; alprazolam 0.5 mg TID PRN; most of the time, she takes just in the evening; bottle filled 04/16/15; 31 pills in the bottle, peach round, R,  029; last dose of alprazolam was last night  She takes hydrocodone for acute pain in her back and buttock; from ripped muscles in her back and butt; she does not have that bottle with her; she had a script the last time she was here given by Bonnita Nasuti; she had it filled with Xanax and Lomotil and when she got home, she does not have any; last dose of hydrocodone was Tuesday or Wednesday of last week; she takes hydrocodone/apap; has M 366, norco white oblong; 3 left  She has high blood pressure; she takes ACE-I/thiazide combination  She smokes cigarettes, below a pack a day; smoked 13 years; mild COPD; on symbicort and SABA  Depression screen [facility] 2/9 07/16/2015 11/27/2014 10/27/2014 08/21/2014  Decreased Interest 0 0 0 0  Down, Depressed, Hopeless 1 0 0 0  PHQ - 2 Score 1 0 0 0  she does not think she is clinically depressed; some things have happened that are absolutely ridiculous Relevant past medical, surgical, family and social history reviewed Past Medical History:  Diagnosis Date  . Anxiety   . Asthma   . Cervicalgia   . Hyperlipidemia    History reviewed. No pertinent surgical history. Family History  Problem Relation Age of Onset  . Cancer Mother   . Diabetes Mother   .  Heart disease Mother   . Cancer Father    Social History  Substance Use Topics  . Smoking status: Current Every Day Smoker  . Smokeless tobacco: Not on file     Comment: less than 1/2 pack per day  . Alcohol use 0.0 oz/week   Interim medical history since last visit reviewed. Allergies and medications reviewed  Review of Systems Per HPI unless specifically indicated above     Objective:    BP 118/78   Pulse 71   Temp 98.1 F (36.7 C) (Oral)   Resp 16   Wt 137 lb (62.1 kg)   SpO2 96%   BMI 22.11 kg/m   Wt Readings from Last 3 Encounters:  07/16/15 137 lb (62.1 kg)  11/27/14 141 lb 11.2 oz (64.3 kg)  11/02/14 140 lb (63.5 kg)    Physical Exam  Constitutional: She appears well-developed and  well-nourished. No distress.  HENT:  Head: Normocephalic and atraumatic.  Eyes: EOM are normal. No scleral icterus.  Neck: No thyromegaly present.  Cardiovascular: Normal rate, regular rhythm and normal heart sounds.   Pulmonary/Chest: Effort normal and breath sounds normal.  Abdominal: Soft. Bowel sounds are normal. She exhibits no distension.  Musculoskeletal: Normal range of motion. She exhibits no edema.  Neurological: She is alert. She exhibits normal muscle tone.  Skin: Skin is warm and dry. She is not diaphoretic. No pallor.  Psychiatric: She has a normal mood and affect. Her behavior is normal. Judgment and thought content normal.    Results for orders placed or performed in visit on 03/30/13  Creatinine, serum  Result Value Ref Range   Creatinine 0.75 0.60 - 1.30 mg/dL   EGFR (African American) >60    EGFR (Non-African Amer.) >60       Assessment & Plan:   Problem List Items Addressed This Visit      Cardiovascular and Mediastinum   Essential hypertension - Primary    Avoid salt substitutes, avoid potassium chloride; check creatinine and potassium; encouraged DASH guidelines; smoking cessation        Respiratory   COPD, mild (HCC)    Smoking cessation stressed; refill symbicort      Relevant Medications   promethazine (PHENERGAN) 25 MG tablet   budesonide-formoterol (SYMBICORT) 160-4.5 MCG/ACT inhaler   albuterol (PROVENTIL HFA) 108 (90 Base) MCG/ACT inhaler     Digestive   IBS (irritable bowel syndrome)    Refer to GI for loose stools, chronic diarrhea, other abd symptoms including pain and pressure; try low FODMAP diet      Relevant Orders   Ambulatory referral to Gastroenterology     Other   Medication monitoring encounter    Check Cr and K+ on ACE-I      Relevant Orders   COMPLETE METABOLIC PANEL WITH GFR   Hyperlipidemia    Check labs      Relevant Orders   Lipid panel   Current tobacco use    Encouraged patient quit smoking       Chronic anxiety    I don't plan on continuing her on long-term benzos; will have her taper off; try other modalities for dealing with anxiety; encouraged counseling; patient should never take hydrocodone plus benzo, discussed danger, risk of accidental overdose      Relevant Medications   ALPRAZolam (XANAX) 0.5 MG tablet   Acute anxiety    Taper off of benzo; use other modalities for dealing with anxiety      Relevant Medications   ALPRAZolam (  XANAX) 0.5 MG tablet    Other Visit Diagnoses   None.     Follow up plan: Return in about 3 months (around 10/16/2015) for follow-up.  An after-visit summary was printed and given to the patient at [facility].  Please see the patient instructions which may contain other information and recommendations beyond what is mentioned above in the assessment and plan.  Meds ordered this encounter  Medications  . hydrocortisone (ANUSOL-HC) 25 MG suppository    Sig: Place 25 mg rectally 2 (two) times daily as needed for hemorrhoids or itching.  . promethazine (PHENERGAN) 25 MG tablet    Sig: Take 25 mg by mouth every 4 (four) hours as needed for nausea or vomiting.  Marland Kitchen acetaminophen (TYLENOL) 500 MG tablet    Sig: Take 500 mg by mouth every 6 (six) hours as needed.  . budesonide-formoterol (SYMBICORT) 160-4.5 MCG/ACT inhaler    Sig: Inhale 2 puffs into the lungs 2 (two) times daily.    Dispense:  1 Inhaler    Refill:  2  . DISCONTD: lisinopril-hydrochlorothiazide (ZESTORETIC) 20-12.5 MG tablet    Sig: Take 1 tablet by mouth daily.    Dispense:  30 tablet    Refill:  0  . albuterol (PROVENTIL HFA) 108 (90 Base) MCG/ACT inhaler    Sig: Inhale 2 puffs into the lungs every 4 (four) hours as needed for wheezing or shortness of breath.    Dispense:  1 Inhaler    Refill:  1  . ALPRAZolam (XANAX) 0.5 MG tablet    Sig: Take 0.5-1 tablets (0.25-0.5 mg total) by mouth at bedtime as needed for anxiety. Then taper gradually over the coming weeks    Orders  Placed This Encounter  Procedures  . COMPLETE METABOLIC PANEL WITH GFR  . Lipid panel  . Ambulatory referral to Gastroenterology

## 2015-07-16 NOTE — Assessment & Plan Note (Signed)
Refer to GI for loose stools, chronic diarrhea, other abd symptoms including pain and pressure; try low FODMAP diet

## 2015-07-25 ENCOUNTER — Encounter: Payer: Self-pay | Admitting: Family Medicine

## 2015-09-19 ENCOUNTER — Telehealth: Payer: Self-pay | Admitting: Family Medicine

## 2015-09-19 DIAGNOSIS — I10 Essential (primary) hypertension: Secondary | ICD-10-CM

## 2015-09-19 NOTE — Telephone Encounter (Signed)
Labs were ordered in July Not resulted yet I will allow limited amount of Rx to give patient a few days to come in to get labs drawn, but we really need to see creatinine and K+ to continue this BP medicine -------------------- Leslie Evans, Please call patient and give reminder that labs are overdue; thank you

## 2015-09-20 NOTE — Telephone Encounter (Signed)
Left detailed voicemail

## 2015-09-24 NOTE — Assessment & Plan Note (Signed)
I don't plan on continuing her on long-term benzos; will have her taper off; try other modalities for dealing with anxiety; encouraged counseling; patient should never take hydrocodone plus benzo, discussed danger, risk of accidental overdose

## 2015-09-24 NOTE — Assessment & Plan Note (Signed)
Check Cr and K+ on ACE-I

## 2015-09-24 NOTE — Assessment & Plan Note (Signed)
Taper off of benzo; use other modalities for dealing with anxiety

## 2015-09-25 ENCOUNTER — Other Ambulatory Visit: Payer: Self-pay | Admitting: Family Medicine

## 2015-09-25 LAB — COMPLETE METABOLIC PANEL WITH GFR
ALK PHOS: 76 U/L (ref 33–130)
ALT: 10 U/L (ref 6–29)
AST: 12 U/L (ref 10–35)
Albumin: 3.6 g/dL (ref 3.6–5.1)
BILIRUBIN TOTAL: 0.4 mg/dL (ref 0.2–1.2)
BUN: 6 mg/dL — ABNORMAL LOW (ref 7–25)
CALCIUM: 9.2 mg/dL (ref 8.6–10.4)
CO2: 25 mmol/L (ref 20–31)
CREATININE: 0.65 mg/dL (ref 0.50–1.05)
Chloride: 108 mmol/L (ref 98–110)
Glucose, Bld: 136 mg/dL — ABNORMAL HIGH (ref 65–99)
Potassium: 3.4 mmol/L — ABNORMAL LOW (ref 3.5–5.3)
Sodium: 141 mmol/L (ref 135–146)
TOTAL PROTEIN: 5.8 g/dL — AB (ref 6.1–8.1)

## 2015-09-25 LAB — LIPID PANEL
CHOLESTEROL: 150 mg/dL (ref 125–200)
HDL: 54 mg/dL (ref >=46)
LDL CALC: 82 mg/dL (ref <130)
TRIGLYCERIDES: 72 mg/dL (ref <150)
Total CHOL/HDL Ratio: 2.8 Ratio (ref <=5.0)
VLDL: 14 mg/dL (ref <30)

## 2015-09-25 MED ORDER — LISINOPRIL-HYDROCHLOROTHIAZIDE 20-12.5 MG PO TABS: 1.0000 | ORAL_TABLET | Freq: Every day | ORAL | 0 refills | Status: DC

## 2015-09-25 NOTE — Telephone Encounter (Signed)
I appreciate that she had labs drawn; I'll send 3 more pills while I wait to see her results

## 2015-09-26 ENCOUNTER — Other Ambulatory Visit: Payer: Self-pay | Admitting: Family Medicine

## 2015-09-26 ENCOUNTER — Other Ambulatory Visit: Payer: Self-pay

## 2015-09-26 DIAGNOSIS — F419 Anxiety disorder, unspecified: Secondary | ICD-10-CM

## 2015-09-26 MED ORDER — LISINOPRIL 20 MG PO TABS: 20.0000 mg | ORAL_TABLET | Freq: Every day | ORAL | 2 refills | Status: DC

## 2015-09-26 NOTE — Progress Notes (Signed)
Stop hctz component; pt to watch BP

## 2015-09-26 NOTE — Telephone Encounter (Signed)
She was instructed to taper off of this more than two months ago; she should be done; no more Xanax

## 2015-10-09 ENCOUNTER — Other Ambulatory Visit: Payer: Self-pay | Admitting: Family Medicine

## 2015-10-09 ENCOUNTER — Encounter: Payer: Self-pay | Admitting: Family Medicine

## 2015-10-09 ENCOUNTER — Ambulatory Visit (INDEPENDENT_AMBULATORY_CARE_PROVIDER_SITE_OTHER): Payer: Self-pay | Admitting: Family Medicine

## 2015-10-09 VITALS — BP 118/64 | HR 69 | Temp 99.0°F | Resp 14 | Ht 65.2 in | Wt 145.0 lb

## 2015-10-09 DIAGNOSIS — E876 Hypokalemia: Secondary | ICD-10-CM

## 2015-10-09 DIAGNOSIS — M5116 Intervertebral disc disorders with radiculopathy, lumbar region: Secondary | ICD-10-CM

## 2015-10-09 DIAGNOSIS — Z23 Encounter for immunization: Secondary | ICD-10-CM

## 2015-10-09 DIAGNOSIS — F419 Anxiety disorder, unspecified: Secondary | ICD-10-CM

## 2015-10-09 DIAGNOSIS — R197 Diarrhea, unspecified: Secondary | ICD-10-CM

## 2015-10-09 DIAGNOSIS — I10 Essential (primary) hypertension: Secondary | ICD-10-CM

## 2015-10-09 NOTE — Assessment & Plan Note (Addendum)
Check stool studies and have patient see gastroenterologist; she will call GI for her own appointment; suggested that she try a gluten-free diet in case this is part of her problem; even if not true celiac disease, she may have sensitivity to gluten; I do not plan on prescribing her a controlled substance such as Lomotil for her loose stools

## 2015-10-09 NOTE — Progress Notes (Signed)
BP 118/64   Pulse 69   Temp 99 F (37.2 C) (Oral)   Resp 14   Ht 5' 5.2" (1.656 m)   Wt 145 lb (65.8 kg)   SpO2 98%   BMI 23.98 kg/m    Subjective:    Patient ID: Leslie Evans, female    DOB: Oct 18, 1955, 60 y.o.   MRN: [id number]  HPI: Leslie Evans is a 60 y.o. female  Chief Complaint  Patient presents with  . Follow-up   She has been checking her BP since stopping the HCTZ; checked Monday morning before any physical activity, 141/93; not eating much salt; has changed her diet; working with doctor, gave up certain things, gets healthy choices; rare social soft drinks; regularly drinking water; drinks coffee, drinks tea, limited sugar Feeling some squishy gel in her hands and feet; rings are tight HCTZ was stopped because of low K+ Does not add salt to her food when cooking, except adds salt to water when boiling water  She stools frequently; runny ever since small intestinal bacterial infection; thought she was going to die at the time; GI tract has not been the same since; Dr. Rutherford Nail treated her twice with antibiotics; had H pylori She was going to a gastroenterologist but she has lost her health insurance; I put the referral in to the GI specialist in July She tried probiotics already, has done two different kinds She was taking Lomotil Reviewed labs; eats salmon, fish, chicken; not much beef per se; only four molars left, hard to chew red meat Tries to choose gluten free when convenient  Issues with chronic pain after an injury, not seeing PM&R or pain clinic doctor; she has to squat to pick up weight; went to orthopaedist and she was told she was as good as she was going to get; cannot take ibuprofen  She has had two bad headaches to the point where she had to take something, took tylenol, did breathing exercises and that eased off  She asked about xanax; she tried Ativan; clonazepam did not work for her; she can't turn her mind off a the end of the day; brain  doesn't shut down  Depression screen [facility] 2/9 10/09/2015 07/16/2015 11/27/2014 10/27/2014 08/21/2014  Decreased Interest 0 0 0 0 0  Down, Depressed, Hopeless 0 1 0 0 0  PHQ - 2 Score 0 1 0 0 0   Relevant past medical, surgical, family and social history reviewed Past Medical History:  Diagnosis Date  . Anxiety   . Asthma   . Cervicalgia   . Hyperlipidemia    History reviewed. No pertinent surgical history.   Family History  Problem Relation Age of Onset  . Cancer Mother   . Diabetes Mother   . Heart disease Mother   . Cancer Father    Social History  Substance Use Topics  . Smoking status: Current Every Day Smoker  . Smokeless tobacco: Never Used     Comment: less than 1/2 pack per day  . Alcohol use 0.0 oz/week   Interim medical history since last visit reviewed. Allergies and medications reviewed  Review of Systems Per HPI unless specifically indicated above     Objective:    BP 118/64   Pulse 69   Temp 99 F (37.2 C) (Oral)   Resp 14   Ht 5' 5.2" (1.656 m)   Wt 145 lb (65.8 kg)   SpO2 98%   BMI 23.98 kg/m   Wt Readings from Last  3 Encounters:  10/09/15 145 lb (65.8 kg)  07/16/15 137 lb (62.1 kg)  11/27/14 141 lb 11.2 oz (64.3 kg)    Physical Exam  Constitutional: She appears well-developed and well-nourished. No distress.  HENT:  Head: Normocephalic and atraumatic.  Eyes: EOM are normal. No scleral icterus.  Neck: No thyromegaly present.  Cardiovascular: Normal rate, regular rhythm and normal heart sounds.   Pulmonary/Chest: Effort normal and breath sounds normal.  Abdominal: Soft. Bowel sounds are normal. She exhibits no distension.  Musculoskeletal: Normal range of motion. She exhibits no edema.  Neurological: She is alert. She displays no tremor. She exhibits normal muscle tone.  No tics  Skin: Skin is warm and dry. She is not diaphoretic. No pallor.  Psychiatric: She has a normal mood and affect. Her behavior is normal. Judgment and thought  content normal. Her mood appears not anxious. Her speech is not rapid and/or pressured, not tangential and not slurred. She does not exhibit a depressed mood.  Talkative, but redirectable   Results for orders placed or performed in visit on 09/25/15  COMPLETE METABOLIC PANEL WITH GFR  Result Value Ref Range   Sodium 141 135 - 146 mmol/L   Potassium 3.4 (L) 3.5 - 5.3 mmol/L   Chloride 108 98 - 110 mmol/L   CO2 25 20 - 31 mmol/L   Glucose, Bld 136 (H) 65 - 99 mg/dL   BUN 6 (L) 7 - 25 mg/dL   Creat 0.65 0.50 - 1.05 mg/dL   Total Bilirubin 0.4 0.2 - 1.2 mg/dL   Alkaline Phosphatase 76 33 - 130 U/L   AST 12 10 - 35 U/L   ALT 10 6 - 29 U/L   Total Protein 5.8 (L) 6.1 - 8.1 g/dL   Albumin 3.6 3.6 - 5.1 g/dL   Calcium 9.2 8.6 - 10.4 mg/dL   GFR, Est African American >89 >=60 mL/min   GFR, Est Non African American >89 >=60 mL/min  Lipid panel  Result Value Ref Range   Cholesterol 150 125 - 200 mg/dL   Triglycerides 72 <150 mg/dL   HDL 54 >=46 mg/dL   Total CHOL/HDL Ratio 2.8 <=5.0 Ratio   VLDL 14 <30 mg/dL   LDL Cholesterol 82 <130 mg/dL      Assessment & Plan:   Problem List Items Addressed This Visit      Cardiovascular and Mediastinum   Essential hypertension    Well-controlled        Nervous and Auditory   Lumbar disc disease with radiculopathy    I offered patient a referral to either PM&R or pain clinic; she declined        Other   Diarrhea - Primary    Check stool studies and have patient see gastroenterologist; she will call GI for her own appointment; suggested that she try a gluten-free diet in case this is part of her problem; even if not true celiac disease, she may have sensitivity to gluten; I do not plan on prescribing her a controlled substance such as Lomotil for her loose stools      Relevant Orders   Stool Culture   Ova and parasite examination   BASIC METABOLIC PANEL WITH GFR   Gliadin antibodies, serum   Tissue transglutaminase, IgA   Reticulin  Antibody, IgA w reflex titer   Chronic anxiety    Patient asked again about benzos; I do not plan on prescribing her any benzodiazepines; tried to explain how benzos work, essentially work like alcohol and  it's no healthier to prescribe her a benzo in pill form as it would be for me to instruct her to drink a glass of wine if she gets anxious or can't relax; I encouraged her to start counseling, as I think CBT would be helpful for her       Other Visit Diagnoses    Needs flu shot       Relevant Orders   Flu Vaccine QUAD 36+ mos PF IM (Fluarix & Fluzone Quad PF) (Completed)   Hypokalemia       mild hypokalemia noted on previous labs; may be secondary to GI losses; ordered recheck today      Follow up plan: Return in about 6 months (around 04/08/2016).  An after-visit summary was printed and given to the patient at [facility].  Please see the patient instructions which may contain other information and recommendations beyond what is mentioned above in the assessment and plan.  No orders of the defined types were placed in this encounter.   Orders Placed This Encounter  Procedures  . Stool Culture  . Ova and parasite examination  . Flu Vaccine QUAD 36+ mos PF IM (Fluarix & Fluzone Quad PF)  . BASIC METABOLIC PANEL WITH GFR  . Gliadin antibodies, serum  . Tissue transglutaminase, IgA  . Reticulin Antibody, IgA w reflex titer

## 2015-10-09 NOTE — Patient Instructions (Addendum)
You received the flu shot today; it should protect you against the flu virus over the coming months; it will take about two weeks for antibodies to develop; do try to stay away from hospitals, nursing homes, and daycares during peak flu season; taking extra vitamin C daily during flu season may help you avoid getting sick  Please submit stool samples for the diarrhea work-up   Your goal blood pressure is less than 140 mmHg on top. Try to follow the DASH guidelines (DASH stands for Dietary Approaches to Stop Hypertension) Try to limit the sodium in your diet.  Ideally, consume less than 1.5 grams (less than 1,500mg ) per day. Do not add salt when cooking or at the table.  Check the sodium amount on labels when shopping, and choose items lower in sodium when given a choice. Avoid or limit foods that already contain a lot of sodium. Eat a diet rich in fruits and vegetables and whole grains.  I'll suggest a strict gluten-free diet for one month to see if that helps Please call the gastroenterologist to see him/her soon I'll leave an open invitation on the chart for you to see a pain specialist or physical medicine and rehab  I'll suggest cognitive behavioral therapy for insomnia and anxiety  Gluten-Free Diet for Celiac Disease Gluten is a protein found in wheat, rye, barley, and triticale (a cross between wheat and rye) grains. People with celiac disease need to have a gluten-free diet. With celiac disease, gluten interferes with the absorption of food and may also cause intestinal injury.  Strict compliance is important even during symptom-free periods. This means eliminating all foods with gluten from your diet permanently. This requires some significant changes but is very manageable. WHAT DO I NEED TO KNOW ABOUT A GLUTEN-FREE DIET?  Look for items labeled with "GF." Looking for GF will make it easier to identify products that are safe to eat.  Read all labels. Gluten may have been added as a  minor ingredient where least expected, such as in shredded cheeses or ice creams. Always check food labels and investigate questionable ingredients. Talk to your dietitian or health care provider if you have questions about certain foods or need help finding GF foods.  Check when in doubt. If you are not sure whether an ingredient contains gluten, check with the manufacturer. Note that some manufacturers may change ingredients without notice. Always read labels.   Know how food is prepared. Since flour and cereal products are often used in the preparation of foods, it is important to be aware of the methods of preparation used, as well as the ingredients in the foods themselves. This is especially true when you are dining out. Ask restaurants if they have a gluten-free menu.  Watch for cross-contamination. Cross-contamination occurs when gluten-free foods come into contact with foods that contain gluten. It often happens during the manufacturing process. Always check the ingredient list and for warnings on packages, such as "may contain gluten."  Eat a balanced diet. It is important to still get enough fiber, iron, and B vitamins in your diet. Look for enriched whole grain gluten-free products and continue to eat a well-balanced diet of the important non-grain items, such as vegetables, fruit, lean proteins, legumes, and dairy.  Consider taking a gluten-free multivitamin and mineral supplement. Discuss this with your health care provider. WHAT KEY WORDS HELP IDENTIFY GLUTEN? Know key words to help identify gluten. A dietitian can help you identify possible harmful ingredients in the foods you  normally eat. Words to check for on food labels include:   Flour, enriched flour, bromated flour, white flour, durum flour, graham flour, phosphated flour, self-rising flour, semolina, or farina.  Starch, dextrin, modified food starch, or cereal.  Thickening, fillers, or emulsifiers.  Any kind of malt  flavoring, extract, or syrup (malt is made from barley and includes malt vinegar, malted milk, and malted beverages).  Hydrolyzed vegetable protein. WHAT FOODS CAN I EAT? Below is a list of common foods that are allowed with a gluten-free diet.  Grains Products made from the following flours or grains:amaranth,bean flours, 100% buckwheat flour, corn, millet, nut flours or meals, GF oats, quinoa, rice, sorghum, teff, any all-purpose 100% GF flour mix, rice wafers, pure cornmeal tortillas, popcorn, some crackers, some chips, and hot cereals made from cornmeal. Ask your dietitian which specific hot and cold cereals are allowed. Hominy, rice or wild rice, and special GF pasta. Some Asian rice noodles or bean noodles. Arrowroot starch, corn bran, corn flour, corn germ, cornmeal, corn starch, potato flour, potato starch flour, and rice bran. Rice flours: plain, brown, and sweet. Rice polish, soy flour, tapioca starch. Vegetables All plain, fresh, frozen, or canned vegetables.  Fruits All fresh, frozen, canned, dried fruits, and fruit juices.  Meats and Other Protein Foods Meat, fish, poultry, or eggs prepared without added wheat, rye, barley, or triticale. Some luncheon meat and some frankfurters. Pure meat. All aged cheese, most processed cheese products, some cottage cheese, and some cream cheese. Dried beans, dried peas, and lentils.  Dairy Milk and yogurt made with allowed ingredients.  Beverages Coffee (regular or decaffeinated), tea, herbal tea (read label to be sure that no wheat flour has been added). Carbonated beverages and some root beers. Wine, sake, and distilled spirits, such as gin, vodka, and whiskey. GF beers and GF ciders.  Sweetsand Desserts Sugar, honey, some syrups, molasses, jelly, jam, plain hard candy, marshmallows, gumdrops, homemade candies free of wheat, rye, barley, or triticale. Coconut. Custard, some pudding mixes, and homemade puddings from cornstarch, rice, and  tapioca. Gelatin desserts, sorbets, frozen ice pops, and sherbet. Cake, cookies, and other desserts prepared with allowed flours. Some commercial ice creams. Ask your dietitian about specific brands of dessert that are allowed.  Fats and Oils Butter, margarine, vegetable oil, sour cream not containing modified food starch, whipping cream, shortening, lard, cream, and some mayonnaise. Some commercial salad dressings. Peanut butter.  Other Homemade broth and soups made with allowed ingredients; some canned or frozen soups. Any other combination or prepared foods that do not contain gluten. Monosodium glutamate (MSG). Cider, rice, and wine vinegar. Baking soda and baking powder. Certain soy sauces (Tamari). Ask your dietitian about specific brands that are allowed. Nuts, coconut, chocolate, and pure cocoa powder. Salt, pepper, herbs, spices, extracts, and food colorings. The items listed above may not be a complete list of allowed foods or beverages. Contact your dietitian for more options.  WHAT FOODS CAN I NOT EAT? Below is a list of common foods that are not allowed with a gluten-free diet.  Grains Barley, bran, bulgur, cracked wheat, graham, malt, matzo, wheat germ, and all wheat and rye cereals including spelt and kamut. Avoid cereals containing malt as a flavoring, such as rice cereal. Also avoid regular noodles, spaghetti, macaroni, and most packaged rice mixes, and all others containing wheat, rye, barley, or triticale.  Vegetables Most creamed vegetables, most vegetables canned in sauces, and any vegetables prepared with wheat, rye, barley, or triticale.  Fruits Thickened or  prepared fruits and some pie fillings.  Meats and Other Protein Sources Any meat or meat alternative containing wheat, rye, barley, or gluten stabilizers (such as some hot dogs, salami, cold cuts, or sausage). Bread-containing products, such as Swiss steak, croquettes, and meatloaf. Most tuna canned in vegetable broth,  [place] with hydrolyzed vegetable protein (HVP) injected as part of the basting, and any cheese product containing oat gum as an ingredient. [place]. Imitation fish. Dairy Commercial chocolate milk, which may have cereal added, and malted milk. Beverages Certain cereal beverages. Beer and ciders (unless GF), ale, malted milk, and some root beers. Sweetsand Desserts Commercial candies containing wheat, rye, barley, or triticale. Certain toffees are dusted with wheat flour. Chocolate-coated nuts, which are often rolled in flour. Cakes, cookies, doughnuts, and pastries that are prepared with wheat, barley, rye, or triticale flour. Some commercial ice creams, ice cream flavors which contain cookies, crumbs, or cheesecake. Ice cream cones. Commercially prepared mixes for cakes, cookies, and other desserts unless marked GF. Bread pudding and other puddings thickened with flour. Fats and Oils Some commercial salad dressings and sour cream containing modified food starch.  Condiments Some curry powder, some dry seasoning mixes, some gravy extracts, some meat sauces, some ketchup, some prepared mustard, horseradish. Other All soups containing wheat, rye, barley, or triticale flour. Bouillon and bouillon cubes that contain HVP. Combination or prepared foods that contain gluten. Some soy sauce, some chip dips, and some chewing gum. Yeast extract (contains barley). Caramel color (may contain malt). The items listed above may not be a complete list of foods and beverages to avoid. Contact your dietitian for more information.   This information is not intended to replace advice given to you by your health care provider. Make sure you discuss any questions you have with your health care provider.   Document Released: 12/23/2004 Document Revised: 01/13/2014 Document Reviewed: 10/27/2012 Elsevier Interactive Patient Education [employer].

## 2015-10-10 LAB — BASIC METABOLIC PANEL WITH GFR
BUN: 6 mg/dL — ABNORMAL LOW (ref 7–25)
CHLORIDE: 111 mmol/L — AB (ref 98–110)
CO2: 26 mmol/L (ref 20–31)
Calcium: 9.6 mg/dL (ref 8.6–10.4)
Creat: 0.71 mg/dL (ref 0.50–1.05)
GFR, Est Non African American: >89 mL/min (ref >=60)
Glucose, Bld: 78 mg/dL (ref 65–99)
POTASSIUM: 4.1 mmol/L (ref 3.5–5.3)
SODIUM: 143 mmol/L (ref 135–146)

## 2015-10-10 LAB — GLIADIN ANTIBODIES, SERUM
GLIADIN IGG: 2 U (ref <20)
Gliadin IgA: 4 Units (ref <20)

## 2015-10-10 LAB — TISSUE TRANSGLUTAMINASE, IGA: TISSUE TRANSGLUTAMINASE AB, IGA: 1 U/mL (ref <4)

## 2015-10-14 NOTE — Assessment & Plan Note (Signed)
Patient asked again about benzos; I do not plan on prescribing her any benzodiazepines; tried to explain how benzos work, essentially work like alcohol and it's no healthier to prescribe her a benzo in pill form as it would be for me to instruct her to drink a glass of wine if she gets anxious or can't relax; I encouraged her to start counseling, as I think CBT would be helpful for her

## 2015-10-14 NOTE — Assessment & Plan Note (Signed)
Well controlled 

## 2015-10-14 NOTE — Assessment & Plan Note (Signed)
I offered patient a referral to either PM&R or pain clinic; she declined

## 2015-10-15 ENCOUNTER — Ambulatory Visit: Payer: Self-pay | Admitting: Family Medicine

## 2015-10-16 LAB — RETICULIN ANTIBODIES, IGA W TITER: RETICULIN AB, IGA: NEGATIVE

## 2015-12-08 ENCOUNTER — Telehealth: Payer: Self-pay | Admitting: Family Medicine

## 2015-12-08 DIAGNOSIS — K589 Irritable bowel syndrome without diarrhea: Secondary | ICD-10-CM

## 2015-12-08 NOTE — Telephone Encounter (Signed)
Please follow-up with patient on the stool studies from October and the GI referral from July; thank you

## 2015-12-11 NOTE — Assessment & Plan Note (Signed)
Re-refer to GI

## 2015-12-11 NOTE — Telephone Encounter (Signed)
Called patient reminded her of stool cards, states misplaced so I will mail her some more.  Please replace referral for GI

## 2015-12-11 NOTE — Telephone Encounter (Signed)
New referral entered Thank you 

## 2015-12-26 ENCOUNTER — Encounter: Payer: Self-pay | Admitting: Gastroenterology

## 2016-01-02 ENCOUNTER — Other Ambulatory Visit: Payer: Self-pay | Admitting: Family Medicine

## 2016-01-02 MED ORDER — LISINOPRIL 20 MG PO TABS: 20.0000 mg | ORAL_TABLET | Freq: Every day | ORAL | 2 refills | Status: DC

## 2016-01-02 NOTE — Telephone Encounter (Signed)
Pt requesting refill on lisinopril she has 3 pills left. Asking that you please send to [facility].

## 2016-01-02 NOTE — Telephone Encounter (Signed)
refill requested 

## 2016-01-02 NOTE — Telephone Encounter (Signed)
hctz stopped b/c K+ was low; recheck showed normal creatinine and K+; Rx approved

## 2016-04-08 ENCOUNTER — Encounter: Payer: Self-pay | Admitting: Family Medicine

## 2016-04-08 ENCOUNTER — Ambulatory Visit (INDEPENDENT_AMBULATORY_CARE_PROVIDER_SITE_OTHER): Payer: Self-pay | Admitting: Family Medicine

## 2016-04-08 VITALS — BP 120/66 | HR 83 | Temp 97.5°F | Wt 152.6 lb

## 2016-04-08 DIAGNOSIS — K589 Irritable bowel syndrome without diarrhea: Secondary | ICD-10-CM

## 2016-04-08 DIAGNOSIS — Z1239 Encounter for other screening for malignant neoplasm of breast: Secondary | ICD-10-CM

## 2016-04-08 DIAGNOSIS — Z720 Tobacco use: Secondary | ICD-10-CM

## 2016-04-08 DIAGNOSIS — I10 Essential (primary) hypertension: Secondary | ICD-10-CM

## 2016-04-08 DIAGNOSIS — J449 Chronic obstructive pulmonary disease, unspecified: Secondary | ICD-10-CM

## 2016-04-08 DIAGNOSIS — Z1231 Encounter for screening mammogram for malignant neoplasm of breast: Secondary | ICD-10-CM

## 2016-04-08 DIAGNOSIS — F419 Anxiety disorder, unspecified: Secondary | ICD-10-CM

## 2016-04-08 DIAGNOSIS — R6889 Other general symptoms and signs: Secondary | ICD-10-CM

## 2016-04-08 MED ORDER — LISINOPRIL 20 MG PO TABS: 20.0000 mg | ORAL_TABLET | Freq: Every day | ORAL | 2 refills | Status: DC

## 2016-04-08 NOTE — Assessment & Plan Note (Signed)
Gave patient information to contact [employer] to see if she qualifies for patient assistance program; she unfortunately does not want to stop smoking right now; I am here to help if/when the days comes

## 2016-04-08 NOTE — Assessment & Plan Note (Signed)
Patient is not ready to quit smoking right now; I am here to help if/when the day comes that she wants to stop

## 2016-04-08 NOTE — Assessment & Plan Note (Signed)
Well controlled 

## 2016-04-08 NOTE — Patient Instructions (Addendum)
Call this number to see if you can arrange to get free mammograms, free pap smears, other free well woman testing To find out if you qualify for a free or low-cost mammogram and Pap test and where to get screened, call: [phone]   Affordability Programs If you take certain [employer] medicines, and you cannot afford them, you may be surprised to learn that [employer] may be able to help. Our programs are designed to help qualifying people without insurance, those in Medicare Part D, and those who have faced a financial crisis recently. If you want to determine which [facility] Prescription Savings Program you may be eligible for, please read more about each program below or call us at 1-800-AZ[facility]Me [phone]).  I do encourage you to quit smoking Call [phone] to sign up for smoking cessation classes You can call 1-800-QUIT-NOW to talk with a smoking cessation coach   DASH Eating Plan [place] stands for "Dietary Approaches to Stop Hypertension." The DASH eating plan is a healthy eating plan that has been shown to reduce high blood pressure (hypertension). It may also reduce your risk for type 2 diabetes, heart disease, and stroke. The DASH eating plan may also help with weight loss. What are tips for following this plan? General guidelines   Avoid eating more than 2,300 mg (milligrams) of salt (sodium) a day. If you have hypertension, you may need to reduce your sodium intake to 1,500 mg a day.  Limit alcohol intake to no more than 1 drink a day for nonpregnant women and 2 drinks a day for men. One drink equals 12 oz of beer, 5 oz of wine, or 1 oz of hard liquor.  Work with your health care provider to maintain a healthy body weight or to lose weight. Ask what an ideal weight is for you.  Get at least 30 minutes of exercise that causes your heart to beat faster (aerobic exercise) most days of the week. Activities may include walking, swimming, or biking.  Work with your health care  provider or diet and nutrition specialist (dietitian) to adjust your eating plan to your individual calorie needs. Reading food labels   Check food labels for the amount of sodium per serving. Choose foods with less than 5 percent of the Daily Value of sodium. Generally, foods with less than 300 mg of sodium per serving fit into this eating plan.  To find whole grains, look for the word "whole" as the first word in the ingredient list. Shopping   Buy products labeled as "low-sodium" or "no salt added."  Buy fresh foods. Avoid canned foods and premade or frozen meals. Cooking   Avoid adding salt when cooking. Use salt-free seasonings or herbs instead of table salt or sea salt. Check with your health care provider or pharmacist before using salt substitutes.  Do not fry foods. Cook foods using healthy methods such as baking, boiling, grilling, and broiling instead.  Cook with heart-healthy oils, such as olive, canola, soybean, or sunflower oil. Meal planning    Eat a balanced diet that includes:  5 or more servings of fruits and vegetables each day. At each meal, try to fill half of your plate with fruits and vegetables.  Up to 6-8 servings of whole grains each day.  Less than 6 oz of lean meat, poultry, or fish each day. A 3-oz serving of meat is about the same size as a deck of cards. One egg equals 1 oz.  2 servings of low-fat dairy each  day.  A serving of nuts, seeds, or beans 5 times each week.  Heart-healthy fats. Healthy fats called Omega-3 fatty acids are found in foods such as flaxseeds and coldwater fish, like sardines, salmon, and mackerel.  Limit how much you eat of the following:  Canned or prepackaged foods.  Food that is high in trans fat, such as fried foods.  Food that is high in saturated fat, such as fatty meat.  Sweets, desserts, sugary drinks, and other foods with added sugar.  Full-fat dairy products.  Do not salt foods before eating.  Try to eat  at least 2 vegetarian meals each week.  Eat more home-cooked food and less restaurant, buffet, and fast food.  When eating at a restaurant, ask that your food be prepared with less salt or no salt, if possible. What foods are recommended? The items listed may not be a complete list. Talk with your dietitian about what dietary choices are best for you. Grains  Whole-grain or whole-wheat bread. Whole-grain or whole-wheat pasta. Brown rice. [place]. Bulgur. Whole-grain and low-sodium cereals. Pita bread. Low-fat, low-sodium crackers. Whole-wheat flour tortillas. Vegetables  Fresh or frozen vegetables (raw, steamed, roasted, or grilled). Low-sodium or reduced-sodium tomato and vegetable juice. Low-sodium or reduced-sodium tomato sauce and tomato paste. Low-sodium or reduced-sodium canned vegetables. Fruits  All fresh, dried, or frozen fruit. Canned fruit in natural juice (without added sugar). Meat and other protein foods  Skinless chicken or [place]. Ground chicken or [place]. Pork with fat trimmed off. Fish and seafood. Egg whites. Dried beans, peas, or lentils. Unsalted nuts, nut butters, and seeds. Unsalted canned beans. Lean cuts of beef with fat trimmed off. Low-sodium, lean deli meat. Dairy  Low-fat (1%) or fat-free (skim) milk. Fat-free, low-fat, or reduced-fat cheeses. Nonfat, low-sodium ricotta or cottage cheese. Low-fat or nonfat yogurt. Low-fat, low-sodium cheese. Fats and oils  Soft margarine without trans fats. Vegetable oil. Low-fat, reduced-fat, or light mayonnaise and salad dressings (reduced-sodium). Canola, safflower, olive, soybean, and sunflower oils. Avocado. Seasoning and other foods  Herbs. Spices. Seasoning mixes without salt. Unsalted popcorn and pretzels. Fat-free sweets. What foods are not recommended? The items listed may not be a complete list. Talk with your dietitian about what dietary choices are best for you. Grains  Baked goods made with fat, such as  croissants, muffins, or some breads. Dry pasta or rice meal packs. Vegetables  Creamed or fried vegetables. Vegetables in a cheese sauce. Regular canned vegetables (not low-sodium or reduced-sodium). Regular canned tomato sauce and paste (not low-sodium or reduced-sodium). Regular tomato and vegetable juice (not low-sodium or reduced-sodium). Angie Fava. Olives. Fruits  Canned fruit in a light or heavy syrup. Fried fruit. Fruit in cream or butter sauce. Meat and other protein foods  Fatty cuts of meat. Ribs. Fried meat. Berniece Salines. Sausage. Bologna and other processed lunch meats. Salami. Fatback. Hotdogs. Bratwurst. Salted nuts and seeds. Canned beans with added salt. Canned or smoked fish. Whole eggs or egg yolks. Chicken or [place] with skin. Dairy  Whole or 2% milk, cream, and half-and-half. Whole or full-fat cream cheese. Whole-fat or sweetened yogurt. Full-fat cheese. Nondairy creamers. Whipped toppings. Processed cheese and cheese spreads. Fats and oils  Butter. Stick margarine. Lard. Shortening. Ghee. Bacon fat. Tropical oils, such as coconut, palm kernel, or palm oil. Seasoning and other foods  Salted popcorn and pretzels. Onion salt, garlic salt, seasoned salt, table salt, and sea salt. Worcestershire sauce. Tartar sauce. Barbecue sauce. Teriyaki sauce. Soy sauce, including reduced-sodium. Steak sauce. Canned and  packaged gravies. Fish sauce. Oyster sauce. Cocktail sauce. Horseradish that you find on the shelf. Ketchup. Mustard. Meat flavorings and tenderizers. Bouillon cubes. Hot sauce and Tabasco sauce. Premade or packaged marinades. Premade or packaged taco seasonings. Relishes. Regular salad dressings. Where to find more information:  National Heart, Lung, and [place]: https://wilson-eaton.com/  American Heart Association: www.heart.org Summary  The DASH eating plan is a healthy eating plan that has been shown to reduce high blood pressure (hypertension). It may also reduce your risk for  type 2 diabetes, heart disease, and stroke.  With the DASH eating plan, you should limit salt (sodium) intake to 2,300 mg a day. If you have hypertension, you may need to reduce your sodium intake to 1,500 mg a day.  When on the DASH eating plan, aim to eat more fresh fruits and vegetables, whole grains, lean proteins, low-fat dairy, and heart-healthy fats.  Work with your health care provider or diet and nutrition specialist (dietitian) to adjust your eating plan to your individual calorie needs. This information is not intended to replace advice given to you by your health care provider. Make sure you discuss any questions you have with your health care provider. Document Released: 12/12/2010 Document Revised: 12/17/2015 Document Reviewed: 12/17/2015 Elsevier Interactive Patient Education  2017 [employer].  Steps to Quit Smoking Smoking tobacco can be bad for your health. It can also affect almost every organ in your body. Smoking puts you and people around you at risk for many serious long-lasting (chronic) diseases. Quitting smoking is hard, but it is one of the best things that you can do for your health. It is never too late to quit. What are the benefits of quitting smoking? When you quit smoking, you lower your risk for getting serious diseases and conditions. They can include:  Lung cancer or lung disease.  Heart disease.  Stroke.  Heart attack.  Not being able to have children (infertility).  Weak bones (osteoporosis) and broken bones (fractures). If you have coughing, wheezing, and shortness of breath, those symptoms may get better when you quit. You may also get sick less often. If you are pregnant, quitting smoking can help to lower your chances of having a baby of low birth weight. What can I do to help me quit smoking? Talk with your doctor about what can help you quit smoking. Some things you can do (strategies) include:  Quitting smoking totally, instead of slowly  cutting back how much you smoke over a period of time.  Going to in-person counseling. You are more likely to quit if you go to many counseling sessions.  Using resources and support systems, such as:  Online chats with a Social worker.  Phone quitlines.  Printed Furniture conservator/restorer.  Support groups or group counseling.  Text messaging programs.  Mobile phone apps or applications.  Taking medicines. Some of these medicines may have nicotine in them. If you are pregnant or breastfeeding, do not take any medicines to quit smoking unless your doctor says it is okay. Talk with your doctor about counseling or other things that can help you. Talk with your doctor about using more than one strategy at the same time, such as taking medicines while you are also going to in-person counseling. This can help make quitting easier. What things can I do to make it easier to quit? Quitting smoking might feel very hard at first, but there is a lot that you can do to make it easier. Take these steps:  Talk  to your family and friends. Ask them to support and encourage you.  Call phone quitlines, reach out to support groups, or work with a Social worker.  Ask people who smoke to not smoke around you.  Avoid places that make you want (trigger) to smoke, such as:  Bars.  Parties.  Smoke-break areas at work.  Spend time with people who do not smoke.  Lower the stress in your life. Stress can make you want to smoke. Try these things to help your stress:  Getting regular exercise.  Deep-breathing exercises.  Yoga.  Meditating.  Doing a body scan. To do this, close your eyes, focus on one area of your body at a time from head to toe, and notice which parts of your body are tense. Try to relax the muscles in those areas.  Download or buy apps on your mobile phone or tablet that can help you stick to your quit plan. There are many free apps, such as QuitGuide from the [employer] Office manager for Disease Control  and Prevention). You can find more support from smokefree.gov and other websites. This information is not intended to replace advice given to you by your health care provider. Make sure you discuss any questions you have with your health care provider. Document Released: 10/19/2008 Document Revised: 08/21/2015 Document Reviewed: 05/09/2014 Elsevier Interactive Patient Education  2017 [employer].

## 2016-04-08 NOTE — Progress Notes (Signed)
BP 120/66   Pulse 83   Temp 97.5 F (36.4 C) (Oral)   Wt 152 lb 9.6 oz (69.2 kg)   SpO2 97%   BMI 25.24 kg/m    Subjective:    Patient ID: Leslie Evans, female    DOB: 08-17-1955, 61 y.o.   MRN: [id number]  HPI: Leslie Evans is a 61 y.o. female  Chief Complaint  Patient presents with  . Follow-up   Patient is here for follow-up; has job interview today HTN; BP well-controlled Gets swellig in the hands and feet; shoes are tight later in the day; works 08-16-10 hours a day, when on feet all day BP goes up at night she noticed; she is taking BP medicine at night We stopped her HCTZ previously because of low potassium; avoiding salt Potassium came up from 3.4 to 4.1 off of the HCTZ  She used to have diarrhea bouts; used to have low levels of xanax in her UDS because everything was washing through She used to take Lomotil and now taking imodium Taking OTC regularly Her last colonoscopy was 2010, not 2012 she says; she is hoping to get health insurance soon  COPD; not limited with her breathing "yet" she says; using symbicort; still smoking and not ready to quit  Has chronic sinusitis; feels like something is up there on the right side; also has some bleeding at times; septum is deviated; breathes in distilled water  She is off of xanax; does not drink alcohol; cites family members who struggle with alcoholism  Depression screen [facility] 2/9 04/08/2016 10/09/2015 07/16/2015 11/27/2014 10/27/2014  Decreased Interest 0 0 0 0 0  Down, Depressed, Hopeless 0 0 1 0 0  PHQ - 2 Score 0 0 1 0 0   Relevant past medical, surgical, family and social history reviewed Past Medical History:  Diagnosis Date  . Anxiety   . Asthma   . Cervicalgia   . Hyperlipidemia    History reviewed. No pertinent surgical history. Family History  Problem Relation Age of Onset  . Cancer Mother   . Diabetes Mother   . Heart disease Mother   . Cancer Father   . Drug abuse Brother   . Diabetes Maternal  Grandmother   . COPD Maternal Grandfather   . Cancer Brother    Social History  Substance Use Topics  . Smoking status: Current Every Day Smoker  . Smokeless tobacco: Never Used     Comment: less than 1/2 pack per day  . Alcohol use 0.0 oz/week   Interim medical history since last visit reviewed. Allergies and medications reviewed  Review of Systems Per HPI unless specifically indicated above     Objective:    BP 120/66   Pulse 83   Temp 97.5 F (36.4 C) (Oral)   Wt 152 lb 9.6 oz (69.2 kg)   SpO2 97%   BMI 25.24 kg/m   Wt Readings from Last 3 Encounters:  04/08/16 152 lb 9.6 oz (69.2 kg)  10/09/15 145 lb (65.8 kg)  07/16/15 137 lb (62.1 kg)    Physical Exam  Constitutional: She appears well-developed and well-nourished. No distress.  HENT:  Deviated septum; enlarged tissue RIGHT side nasal passage where patient feels "something"; erythematous tissue at vestibule inferiorly on the LEFT; no active bleeding  Eyes: No scleral icterus.  Cardiovascular: Normal rate and regular rhythm.   Pulmonary/Chest: Effort normal and breath sounds normal.  Abdominal: Soft. Bowel sounds are normal. She exhibits no distension. There is  no tenderness. There is no guarding.  Musculoskeletal: She exhibits no edema.  Neurological: She is alert.  Psychiatric: She has a normal mood and affect.    Results for orders placed or performed in visit on 72/53/66  BASIC METABOLIC PANEL WITH GFR  Result Value Ref Range   Sodium 143 135 - 146 mmol/L   Potassium 4.1 3.5 - 5.3 mmol/L   Chloride 111 (H) 98 - 110 mmol/L   CO2 26 20 - 31 mmol/L   Glucose, Bld 78 65 - 99 mg/dL   BUN 6 (L) 7 - 25 mg/dL   Creat 0.71 0.50 - 1.05 mg/dL   Calcium 9.6 8.6 - 10.4 mg/dL   GFR, Est African American >89 >=60 mL/min   GFR, Est Non African American >89 >=60 mL/min  Tissue transglutaminase, IgA  Result Value Ref Range   Tissue Transglutaminase Ab, IgA 1 <4 U/mL  Gliadin antibodies, serum  Result Value Ref  Range   Gliadin IgG 2 <20 Units   Gliadin IgA 4 <20 Units  Reticulin Antibody, IgA w titer  Result Value Ref Range   Reticulin Ab, IgA Negative Negative   Additional Testing REPORT       Assessment & Plan:   Problem List Items Addressed This Visit      Cardiovascular and Mediastinum   Essential hypertension - Primary    Well-controlled      Relevant Medications   lisinopril (PRINIVIL,ZESTRIL) 20 MG tablet     Respiratory   COPD, mild ([place])    Gave patient information to contact [employer] to see if she qualifies for patient assistance program; she unfortunately does not want to stop smoking right now; I am here to help if/when the days comes        Digestive   IBS (irritable bowel syndrome)    Chronic issue; previous testing for celiac disease was negative        Other   Current tobacco use    Patient is not ready to quit smoking right now; I am here to help if/when the day comes that she wants to stop      Chronic anxiety    So glad patient quit the xanax       Other Visit Diagnoses    Breast cancer screening       patient has no insurance, so I gave her the phone number to call to see if she qualifies for free well woman care including pap smears and mammograms; see AVS   Relevant Orders   MM Digital Screening   Abnormal nasal finding       Relevant Orders   Ambulatory referral to ENT       Follow up plan: Return in about 6 months (around 10/08/2016) for follow-up and fasting labs; return for physical when due.  An after-visit summary was printed and given to the patient at [place].  Please see the patient instructions which may contain other information and recommendations beyond what is mentioned above in the assessment and plan.  Meds ordered this encounter  Medications  . lisinopril (PRINIVIL,ZESTRIL) 20 MG tablet    Sig: Take 1 tablet (20 mg total) by mouth daily.    Dispense:  90 tablet    Refill:  2    Orders Placed This Encounter    Procedures  . MM Digital Screening  . Ambulatory referral to ENT

## 2016-04-08 NOTE — Assessment & Plan Note (Signed)
So glad patient quit the xanax

## 2016-04-08 NOTE — Assessment & Plan Note (Signed)
Chronic issue; previous testing for celiac disease was negative

## 2016-10-08 ENCOUNTER — Encounter: Payer: Self-pay | Admitting: Family Medicine

## 2017-02-19 ENCOUNTER — Ambulatory Visit: Payer: Self-pay | Admitting: Family Medicine

## 2017-02-25 ENCOUNTER — Other Ambulatory Visit: Payer: Self-pay | Admitting: Family Medicine

## 2017-02-25 ENCOUNTER — Other Ambulatory Visit: Payer: Self-pay

## 2017-02-25 MED ORDER — LISINOPRIL 20 MG PO TABS: 20.0000 mg | ORAL_TABLET | Freq: Every day | ORAL | 0 refills | Status: DC

## 2017-02-25 NOTE — Telephone Encounter (Signed)
Copied from [place] [id number]. Topic: Quick Communication - Rx Refill/Question >> Feb 25, 2017  2:01 PM Burnis Medin, NT wrote: Medication: lisinopril (PRINIVIL,ZESTRIL) 20 MG tablet   Has the patient contacted their pharmacy? Yes    (Agent: If no, request that the patient contact the pharmacy for the refill.)   Preferred Pharmacy (with phone number or street name): [facility] [address] [place] - [place] [phone] (Phone) [phone] (Fax)     Agent: Please be advised that RX refills may take up to 3 business days. We ask that you follow-up with your pharmacy.

## 2017-02-25 NOTE — Telephone Encounter (Signed)
Patient was due for labs and a visit in October She does have an appt coming up in March; I'll allow one 30 days supply, but really need labs for further refills

## 2017-03-10 ENCOUNTER — Encounter: Payer: Self-pay | Admitting: Family Medicine

## 2017-03-10 ENCOUNTER — Ambulatory Visit (INDEPENDENT_AMBULATORY_CARE_PROVIDER_SITE_OTHER): Payer: Self-pay | Admitting: Family Medicine

## 2017-03-10 VITALS — BP 122/68 | HR 94 | Temp 98.1°F | Resp 16 | Wt 144.3 lb

## 2017-03-10 DIAGNOSIS — Z23 Encounter for immunization: Secondary | ICD-10-CM

## 2017-03-10 DIAGNOSIS — D485 Neoplasm of uncertain behavior of skin: Secondary | ICD-10-CM

## 2017-03-10 DIAGNOSIS — E782 Mixed hyperlipidemia: Secondary | ICD-10-CM

## 2017-03-10 DIAGNOSIS — K589 Irritable bowel syndrome without diarrhea: Secondary | ICD-10-CM

## 2017-03-10 DIAGNOSIS — Z5181 Encounter for therapeutic drug level monitoring: Secondary | ICD-10-CM

## 2017-03-10 DIAGNOSIS — F419 Anxiety disorder, unspecified: Secondary | ICD-10-CM

## 2017-03-10 DIAGNOSIS — J449 Chronic obstructive pulmonary disease, unspecified: Secondary | ICD-10-CM

## 2017-03-10 DIAGNOSIS — I10 Essential (primary) hypertension: Secondary | ICD-10-CM

## 2017-03-10 MED ORDER — DULOXETINE HCL 30 MG PO CPEP: 30.0000 mg | ORAL_CAPSULE | Freq: Every day | ORAL | 0 refills | Status: DC

## 2017-03-10 MED ORDER — CHOLESTYRAMINE 4 G PO PACK
4.0000 g | PACK | Freq: Two times a day (BID) | ORAL | 0 refills | Status: DC
Start: 2017-03-10 — End: 2018-08-02

## 2017-03-10 NOTE — Assessment & Plan Note (Signed)
Patient has trouble affording her symbicort; explained it only lasts about 12 hours; encouraged her to check out patient assistance program; applications downloaded and printed for her to take and apply

## 2017-03-10 NOTE — Assessment & Plan Note (Signed)
Check cr and K+ 

## 2017-03-10 NOTE — Patient Instructions (Addendum)
Return for a complete physical and preventive care labs if/when you get insurance or feel like you want to get that done  I do encourage you to quit smoking Call [phone] to sign up for smoking cessation classes You can call 1-800-QUIT-NOW to talk with a smoking cessation coach Start cymbalta and the cholestyramine Call with an update in 3 weeks about your anxiety, smoking, symbicort, diarrhea, etc.  12 Ways to Curb Anxiety  ?Anxiety is normal human sensation. It is what helped our ancestors survive the pitfalls of the wilderness. Anxiety is defined as experiencing worry or nervousness about an imminent event or something with an uncertain outcome. It is a feeling experienced by most people at some point in their lives. Anxiety can be triggered by a very personal issue, such as the illness of a loved one, or an event of global proportions, such as a refugee crisis. Some of the symptoms of anxiety are:  Feeling restless.  Having a feeling of impending danger.  Increased heart rate.  Rapid breathing. Sweating.  Shaking.  Weakness or feeling tired.  Difficulty concentrating on anything except the current worry.  Insomnia.  Stomach or bowel problems. What can we do about anxiety we may be feeling? There are many techniques to help manage stress and relax. Here are 12 ways you can reduce your anxiety almost immediately: 1. Turn off the constant feed of information. Take a social media sabbatical. Studies have shown that social media directly contributes to social anxiety.  2. Monitor your television viewing habits. Are you watching shows that are also contributing to your anxiety, such as 24-hour news stations? Try watching something else, or better yet, nothing at all. Instead, listen to music, read an inspirational book or practice a hobby. 3. Eat nutritious meals. Also, don't skip meals and keep healthful snacks on hand. Hunger and poor diet contributes to feeling anxious. 4. Sleep.  Sleeping on a regular schedule for at least seven to eight hours a night will do wonders for your outlook when you are awake. 5. Exercise. Regular exercise will help rid your body of that anxious energy and help you get more restful sleep. 6. Try deep (diaphragmatic) breathing. Inhale slowly through your nose for five seconds and exhale through your mouth. 7. Practice acceptance and gratitude. When anxiety hits, accept that there are things out of your control that shouldn't be of immediate concern.  8. Seek out humor. When anxiety strikes, watch a funny video, read jokes or call a friend who makes you laugh. Laughter is healing for our bodies and releases endorphins that are calming. 9. Stay positive. Take the effort to replace negative thoughts with positive ones. Try to see a stressful situation in a positive light. Try to come up with solutions rather than dwelling on the problem. 10. Figure out what triggers your anxiety. Keep a journal and make note of anxious moments and the events surrounding them. This will help you identify triggers you can avoid or even eliminate. 11. Talk to someone. Let a trusted friend, family member or even trained professional know that you are feeling overwhelmed and anxious. Verbalize what you are feeling and why.  12. Volunteer. If your anxiety is triggered by a crisis on a large scale, become an advocate and work to resolve the problem that is causing you unease. Anxiety is often unwelcome and can become overwhelming. If not kept in check, it can become a disorder that could require medical treatment. However, if you take the time  to care for yourself and avoid the triggers that make you anxious, you will be able to find moments of relaxation and clarity that make your life much more enjoyable.   Steps to Quit Smoking Smoking tobacco can be bad for your health. It can also affect almost every organ in your body. Smoking puts you and people around you at risk for many  serious long-lasting (chronic) diseases. Quitting smoking is hard, but it is one of the best things that you can do for your health. It is never too late to quit. What are the benefits of quitting smoking? When you quit smoking, you lower your risk for getting serious diseases and conditions. They can include:  Lung cancer or lung disease.  Heart disease.  Stroke.  Heart attack.  Not being able to have children (infertility).  Weak bones (osteoporosis) and broken bones (fractures).  If you have coughing, wheezing, and shortness of breath, those symptoms may get better when you quit. You may also get sick less often. If you are pregnant, quitting smoking can help to lower your chances of having a baby of low birth weight. What can I do to help me quit smoking? Talk with your doctor about what can help you quit smoking. Some things you can do (strategies) include:  Quitting smoking totally, instead of slowly cutting back how much you smoke over a period of time.  Going to in-person counseling. You are more likely to quit if you go to many counseling sessions.  Using resources and support systems, such as: ? Database administrator with a Social worker. ? Phone quitlines. ? Careers information officer. ? Support groups or group counseling. ? Text messaging programs. ? Mobile phone apps or applications.  Taking medicines. Some of these medicines may have nicotine in them. If you are pregnant or breastfeeding, do not take any medicines to quit smoking unless your doctor says it is okay. Talk with your doctor about counseling or other things that can help you.  Talk with your doctor about using more than one strategy at the same time, such as taking medicines while you are also going to in-person counseling. This can help make quitting easier. What things can I do to make it easier to quit? Quitting smoking might feel very hard at first, but there is a lot that you can do to make it easier. Take these  steps:  Talk to your family and friends. Ask them to support and encourage you.  Call phone quitlines, reach out to support groups, or work with a Social worker.  Ask people who smoke to not smoke around you.  Avoid places that make you want (trigger) to smoke, such as: ? Bars. ? Parties. ? Smoke-break areas at work.  Spend time with people who do not smoke.  Lower the stress in your life. Stress can make you want to smoke. Try these things to help your stress: ? Getting regular exercise. ? Deep-breathing exercises. ? Yoga. ? Meditating. ? Doing a body scan. To do this, close your eyes, focus on one area of your body at a time from head to toe, and notice which parts of your body are tense. Try to relax the muscles in those areas.  Download or buy apps on your mobile phone or tablet that can help you stick to your quit plan. There are many free apps, such as QuitGuide from the [employer] Office manager for Disease Control and Prevention). You can find more support from smokefree.gov and other websites.  This information is not intended to replace advice given to you by your health care provider. Make sure you discuss any questions you have with your health care provider. Document Released: 10/19/2008 Document Revised: 08/21/2015 Document Reviewed: 05/09/2014 Elsevier Interactive Patient Education  2018 Elsevier Inc.  

## 2017-03-10 NOTE — Progress Notes (Signed)
BP 122/68   Pulse 94   Temp 98.1 F (36.7 C) (Oral)   Resp 16   Wt 144 lb 4.8 oz (65.5 kg)   SpO2 94%   BMI 23.87 kg/m    Subjective:    Patient ID: Leslie Evans, female    DOB: 08/15/1955, 62 y.o.   MRN: [id number]  HPI: Leslie Evans is a 62 y.o. female  Chief Complaint  Patient presents with  . Follow-up  . Medication Refill    HPI Patient is here for f/u Chronic anxiety; interested in something herbal, natural Used to take benzos, clonazepam, xanax Nervous even as a teenager; went to doctor at 62 years of age  COPD Cannot afford her symbicort; no insurance  Chronic pain; used to be on hydrocodone; uses back brace  Anxiety Pt has been off of daily xanax for the past few months and notes that she has had increased anxiey. States last Sunday had her first anxiety attack at work- shortness of breath, diaphoretic, dizziness and nausea. Patient you sat down, took slow deep breaths and was breathing in her hands and placed a cold rag on head. Patient states feeling back to normal in about an hour.   Insomnia  Patient states used to take xanax for sleep and has been unable to sleep well without it. Patient states tries melatonin maybe once a week with mild relief of symptoms. 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 Patient states gets 6-8 hours of rest a night on normal nights but 5 hours of nights she has to work.   Hypertension Patient takes lisinopril daily, no missed doses denies cough except when smoking. Patient denies headaches, blurry vision. Sts stopped HCTZ last visit and feels she is retaining fluid.   COPD Patient takes symbicort when she starts having a hard time catching her breath while working. She doesn't have health insurance and symbicort costs over $300.  Patient smokes less than a pack a day. She got an e-cig that when she uses that she smokes less. States isn't ready to quit but is willing to cut down.  Depression screen [facility] 2/9 03/10/2017 04/08/2016  10/09/2015 07/16/2015 11/27/2014  Decreased Interest 0 0 0 0 0  Down, Depressed, Hopeless 0 0 0 1 0  PHQ - 2 Score 0 0 0 1 0    Relevant past medical, surgical, family and social history reviewed Past Medical History:  Diagnosis Date  . Anxiety   . Asthma   . Cervicalgia   . Hyperlipidemia    History reviewed. No pertinent surgical history. Family History  Problem Relation Age of Onset  . Cancer Mother   . Diabetes Mother   . Heart disease Mother   . Cancer Father   . Drug abuse Brother   . Diabetes Maternal Grandmother   . COPD Maternal Grandfather   . Cancer Brother    Social History   Tobacco Use  . Smoking status: Current Every Day Smoker  . Smokeless tobacco: Never Used  . Tobacco comment: less than 1/2 pack per day  Substance Use Topics  . Alcohol use: Yes    Alcohol/week: 0.0 oz  . Drug use: No    Interim medical history since last visit reviewed. Allergies and medications reviewed  Review of Systems Per HPI unless specifically indicated above     Objective:    BP 122/68   Pulse 94   Temp 98.1 F (36.7 C) (Oral)   Resp 16   Wt 144  lb 4.8 oz (65.5 kg)   SpO2 94%   BMI 23.87 kg/m   Wt Readings from Last 3 Encounters:  03/10/17 144 lb 4.8 oz (65.5 kg)  04/08/16 152 lb 9.6 oz (69.2 kg)  10/09/15 145 lb (65.8 kg)    Physical Exam  Constitutional: She appears well-developed and well-nourished. No distress.  HENT:  Head: Normocephalic and atraumatic.  Eyes: No scleral icterus.  Cardiovascular: Normal rate and regular rhythm.  Pulmonary/Chest: Effort normal and breath sounds normal.  Abdominal: She exhibits no distension.  Neurological: She is alert.  Skin: No pallor.  Lesion on the LEFT temple, 5 mm papular lesion  Psychiatric: She has a normal mood and affect.       Assessment & Plan:   Problem List Items Addressed This Visit      Cardiovascular and Mediastinum   Essential hypertension - Primary    Well-controlled      Relevant  Medications   cholestyramine (QUESTRAN) 4 g packet     Respiratory   COPD, mild ([place])    Patient has trouble affording her symbicort; explained it only lasts about 12 hours; encouraged her to check out patient assistance program; applications downloaded and printed for her to take and apply        Digestive   IBS (irritable bowel syndrome)    Dealing with stress may help; continue fiber; trial of cholestyramine        Other   Medication monitoring encounter    Check cr and K+      Relevant Orders   Basic metabolic panel (Completed)   Hyperlipidemia    Start cholestyramine      Relevant Medications   cholestyramine (QUESTRAN) 4 g packet   Chronic anxiety    Patient would like to take an herbal product; see AVS      Relevant Medications   DULoxetine (CYMBALTA) 30 MG capsule    Other Visit Diagnoses    Needs flu shot       Relevant Orders   Flu Vaccine QUAD 6+ mos PF IM (Fluarix Quad PF) (Completed)   Neoplasm of uncertain behavior of skin of face       patient will see derm       Follow up plan: No Follow-up on file.  An after-visit summary was printed and given to the patient at [place].  Please see the patient instructions which may contain other information and recommendations beyond what is mentioned above in the assessment and plan.  Meds ordered this encounter  Medications  . DULoxetine (CYMBALTA) 30 MG capsule    Sig: Take 1 capsule (30 mg total) by mouth daily. For one week, then two pills daily    Dispense:  53 capsule    Refill:  0  . cholestyramine (QUESTRAN) 4 g packet    Sig: Take 1 packet (4 g total) by mouth 2 (two) times daily. (build up gradually, just once a day for 1 week, then twice a day)    Dispense:  60 each    Refill:  0    Orders Placed This Encounter  Procedures  . Flu Vaccine QUAD 6+ mos PF IM (Fluarix Quad PF)  . Basic metabolic panel

## 2017-03-10 NOTE — Assessment & Plan Note (Signed)
Dealing with stress may help; continue fiber; trial of cholestyramine

## 2017-03-10 NOTE — Assessment & Plan Note (Signed)
Well controlled 

## 2017-03-10 NOTE — Assessment & Plan Note (Signed)
Start cholestyramine

## 2017-03-10 NOTE — Assessment & Plan Note (Signed)
Patient would like to take an herbal product; see AVS

## 2017-03-11 ENCOUNTER — Other Ambulatory Visit: Payer: Self-pay | Admitting: Family Medicine

## 2017-03-11 LAB — BASIC METABOLIC PANEL
BUN: 12 mg/dL (ref 7–25)
CALCIUM: 9.8 mg/dL (ref 8.6–10.4)
CO2: 26 mmol/L (ref 20–32)
Chloride: 111 mmol/L — ABNORMAL HIGH (ref 98–110)
Creat: 0.74 mg/dL (ref 0.50–0.99)
Glucose, Bld: 77 mg/dL (ref 65–139)
POTASSIUM: 4 mmol/L (ref 3.5–5.3)
SODIUM: 142 mmol/L (ref 135–146)

## 2017-03-11 MED ORDER — LISINOPRIL 20 MG PO TABS: 20.0000 mg | ORAL_TABLET | Freq: Every day | ORAL | 3 refills | Status: DC

## 2017-03-11 NOTE — Progress Notes (Signed)
One year of refills sent

## 2017-03-12 ENCOUNTER — Telehealth: Payer: Self-pay

## 2017-03-12 NOTE — Telephone Encounter (Signed)
-----   Message from Arnetha Courser, MD sent at 03/11/2017  5:16 AM EST ----- Please let pt know that her labs are stable; kidney function is in the expected range and her potassium is fine, so okay to continue blood pressure medicines; I just sent in a whole year's worth of refills to [facility]; we really appreciate her coming in yesterday and have her bloodwork done too; thank you

## 2017-03-12 NOTE — Telephone Encounter (Signed)
Called pt no answer. Left detailed message informing pt of lab results. CRM created. Labs routed to [facility].

## 2017-04-08 ENCOUNTER — Other Ambulatory Visit: Payer: Self-pay | Admitting: Family Medicine

## 2017-04-09 NOTE — Telephone Encounter (Signed)
Patient was supposed to call me, which I did as a service to her to prevent her from having to come in She has not called See last AVS: Start cymbalta and the cholestyramine Call with an update in 3 weeks about your anxiety, smoking, symbicort, diarrhea, etc.   Please call patient and ask her for the update; in the future, we'll just schedule her for a follow-up visit after medicine changes I need to know how the medicine is working, any side effects, etc before refilling Thank you

## 2017-04-09 NOTE — Telephone Encounter (Signed)
Left detailed voicemail

## 2018-03-25 ENCOUNTER — Ambulatory Visit: Payer: Self-pay | Admitting: Family Medicine

## 2018-03-25 ENCOUNTER — Other Ambulatory Visit: Payer: Self-pay

## 2018-03-25 ENCOUNTER — Telehealth: Payer: Self-pay | Admitting: Family Medicine

## 2018-03-25 ENCOUNTER — Encounter: Payer: Self-pay | Admitting: Family Medicine

## 2018-03-25 VITALS — BP 150/100 | HR 95 | Temp 98.1°F | Resp 16 | Ht 64.5 in | Wt 132.0 lb

## 2018-03-25 DIAGNOSIS — Z599 Problem related to housing and economic circumstances, unspecified: Secondary | ICD-10-CM

## 2018-03-25 DIAGNOSIS — J441 Chronic obstructive pulmonary disease with (acute) exacerbation: Secondary | ICD-10-CM

## 2018-03-25 DIAGNOSIS — Z598 Other problems related to housing and economic circumstances: Secondary | ICD-10-CM

## 2018-03-25 DIAGNOSIS — I10 Essential (primary) hypertension: Secondary | ICD-10-CM

## 2018-03-25 DIAGNOSIS — Z720 Tobacco use: Secondary | ICD-10-CM

## 2018-03-25 DIAGNOSIS — F419 Anxiety disorder, unspecified: Secondary | ICD-10-CM

## 2018-03-25 MED ORDER — ALBUTEROL SULFATE HFA 108 (90 BASE) MCG/ACT IN AERS: 2.0000 | INHALATION_SPRAY | RESPIRATORY_TRACT | 1 refills | Status: DC | PRN

## 2018-03-25 MED ORDER — CITALOPRAM HYDROBROMIDE 20 MG PO TABS: 20.0000 mg | ORAL_TABLET | Freq: Every day | ORAL | 0 refills | Status: DC

## 2018-03-25 MED ORDER — BUDESONIDE-FORMOTEROL FUMARATE 160-4.5 MCG/ACT IN AERO: 2.0000 | INHALATION_SPRAY | Freq: Two times a day (BID) | RESPIRATORY_TRACT | 2 refills | Status: DC

## 2018-03-25 MED ORDER — HYDROXYZINE HCL 10 MG PO TABS: 10.0000 mg | ORAL_TABLET | Freq: Every day | ORAL | 0 refills | Status: DC | PRN

## 2018-03-25 MED ORDER — PREDNISONE 10 MG PO TABS: 10.0000 mg | ORAL_TABLET | Freq: Two times a day (BID) | ORAL | 0 refills | Status: DC

## 2018-03-25 MED ORDER — LISINOPRIL 40 MG PO TABS: 40.0000 mg | ORAL_TABLET | Freq: Every day | ORAL | 0 refills | Status: DC

## 2018-03-25 NOTE — Telephone Encounter (Signed)
Copied from [place] [id number]. Topic: Quick Communication - See Telephone Encounter >> Mar 25, 2018 12:57 PM Ivar Drape wrote: CRM for notification. See Telephone encounter for: 03/25/18. Patient was in to see the provider today and was told the provider was going to send in a prescription for lisinopril-hydrochlorothiazide (ZESTORETIC) 8 MG per tablet but it's not at the pharmacy [facility] on [address]. Patient Is at the pharmacy waiting.

## 2018-03-25 NOTE — Telephone Encounter (Signed)
Sent higher dose of lisinopril instead.

## 2018-03-25 NOTE — Progress Notes (Addendum)
Name: Leslie Evans   MRN: [id number]    DOB: Aug 07, 1955   Date:03/25/2018       Progress Note  Subjective  Chief Complaint  Chief Complaint  Patient presents with  . Sore Throat  . Otitis Media  . Fever  . Headache  . 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 She states she usually gets bronchitis after an URI but is not allowed to go back to work secondary to COVID-19 outbreak. She states symptoms started 2 days ago, initially with fatigue, rhinorrhea, nasal congestion , mild sore throat and post-nasal drainage. She states yesterday she had a subjective fever. She has COPD with asthma and ran out of Symbicort ( secondary to cost) , and has noticed wheezing and SOB so she was not sure because of lack of medication of worsening of symptoms. Appetite is normal. She has IBS and bowel movements are unchanged. Discussed importance of quitting smoking   HTN: she was on lisinopril hctz but last visit potassium was slightly low and Dr. Sanda Klein switched her to lisinopril only. She states at home her bp has been 130's/80's. We will go up on dose of lisinopril to 40 mg and continue to monitor , also advised to see if she gets dizzy when standing up . She states she has not been taking otc medications for URI. She denies chest pain or palpitation but has noticed intermittent dizziness that can happen when she is sitting dow   Chronic anxiety: she used to take BZD for panic attacks, still has two left from Dr. Manuella Ghazi, episodes of panic attacks about 2 in the past month, she was unable to afford Duloxetine ( she does not have insurance), we will try switching to Celexa ( $10 at local [facility]) and add hydroxyzine to take prn only. We will also place referral to our chronic care team. She is very stressed at her new job, she feels overwhelmed with the number of patients that she needs to see and does not feel valued   Patient Active Problem List   Diagnosis Date Noted  . Diarrhea  10/09/2015  . Essential hypertension 11/27/2014  . IBS (irritable bowel syndrome) 08/21/2014  . Lumbar disc disease with radiculopathy 08/21/2014  . Chronic anxiety 08/21/2014  . Hyperlipidemia 08/21/2014  . Abnormal CAT scan 07/21/2013  . COPD, mild ([place]) 07/21/2013  . Compulsive tobacco user syndrome 07/21/2013  . Childhood asthma 07/21/2013  . COPD with asthma ([place]) 07/21/2013  . Current tobacco use 07/21/2013    No past surgical history on file.  Family History  Problem Relation Age of Onset  . Cancer Mother   . Diabetes Mother   . Heart disease Mother   . Cancer Father   . Drug abuse Brother   . Diabetes Maternal Grandmother   . COPD Maternal Grandfather   . Cancer Brother     Social History   Socioeconomic History  . Marital status: Divorced    Spouse name: Not on file  . Number of children: Not on file  . Years of education: Not on file  . Highest education level: Not on file  Occupational History  . Occupation: Therapist, sports    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 [place]  . Financial resource strain: Hard  . Food insecurity:    Worry: Sometimes true    Inability: Sometimes true  . Transportation needs:    Medical: No    Non-medical: No  Tobacco Use  . Smoking status: Current Every Day Smoker  . Smokeless tobacco: Never Used  . Tobacco comment: less than 1/2 pack per day  Substance and Sexual Activity  . Alcohol use: Yes    Alcohol/week: 0.0 standard drinks  . Drug use: No  . Sexual activity: Not Currently  Lifestyle  . Physical activity:    Days per week: Not on file    Minutes per session: Not on file  . Stress: Not on file  Relationships  . Social connections:    Talks on phone: Not on file    Gets together: Not on file    Attends religious service: Not on file    Active member of club or organization: Not on file    Attends meetings of clubs or organizations: Not on file    Relationship status: Not on file  . Intimate partner violence:     Fear of current or ex partner: Not on file    Emotionally abused: Not on file    Physically abused: Not on file    Forced sexual activity: Not on file  Other Topics Concern  . Not on file  Social History Narrative   She does not have insurance      Current Outpatient Medications:  .  acetaminophen (TYLENOL) 500 MG tablet, Take 500 mg by mouth every 6 (six) hours as needed., Disp: , Rfl:  .  albuterol (PROVENTIL HFA) 108 (90 Base) MCG/ACT inhaler, Inhale 2 puffs into the lungs every 4 (four) hours as needed for wheezing or shortness of breath., Disp: 1 Inhaler, Rfl: 1 .  budesonide-formoterol (SYMBICORT) 160-4.5 MCG/ACT inhaler, Inhale 2 puffs into the lungs 2 (two) times daily., Disp: 1 Inhaler, Rfl: 2 .  cholestyramine (QUESTRAN) 4 g packet, Take 1 packet (4 g total) by mouth 2 (two) times daily. (build up gradually, just once a day for 1 week, then twice a day), Disp: 60 each, Rfl: 0 .  ranitidine (ZANTAC) 150 MG tablet, Take 1 tablet (150 mg total) by mouth 2 (two) times daily., Disp: 10 tablet, Rfl: 0 .  citalopram (CELEXA) 20 MG tablet, Take 1 tablet (20 mg total) by mouth daily., Disp: 90 tablet, Rfl: 0 .  hydrOXYzine (ATARAX/VISTARIL) 10 MG tablet, Take 1 tablet (10 mg total) by mouth daily as needed for anxiety., Disp: 30 tablet, Rfl: 0 .  lisinopril (PRINIVIL,ZESTRIL) 40 MG tablet, Take 1 tablet (40 mg total) by mouth daily., Disp: 90 tablet, Rfl: 0 .  Melatonin-Pyridoxine (MELATIN PO), Take 1 tablet by mouth at bedtime as needed., Disp: , Rfl:  .  predniSONE (DELTASONE) 10 MG tablet, Take 1 tablet (10 mg total) by mouth 2 (two) times daily with a meal., Disp: 10 tablet, Rfl: 0  Allergies  Allergen Reactions  . Gabapentin Dermatitis  . Ibuprofen Nausea And Vomiting  . Lamisil [Terbinafine] Swelling  . Pregabalin Other (See Comments)  . Tetracycline Other (See Comments)    I personally reviewed active problem list, medication list, allergies, family history, social history  with the patient/caregiver today.   ROS  Constitutional: Negative for fever or weight change.  Respiratory: positive for cough and shortness of breath.   Cardiovascular: Negative for chest pain or palpitations.  Gastrointestinal: Negative for abdominal pain, no bowel changes  - she has recurrent diarrhea from IBS   Musculoskeletal: Negative for gait problem or joint swelling.  Skin: Negative for rash.  Neurological: positive for intermittent  dizziness but no  headache.  No other specific  complaints in a complete review of systems (except as listed in HPI above).  Objective  Vitals:   03/25/18 1120 03/25/18 1124 03/25/18 1155  BP: (!) 130/100 (!) 150/100 (!) 150/100  Pulse: 95    Resp: 16    Temp: 98.1 F (36.7 C)    TempSrc: Oral    SpO2: 98%    Weight: 132 lb (59.9 kg)    Height: 5' 4.5" (1.638 m)      Body mass index is 22.31 kg/m.  Physical Exam  Constitutional: Patient appears well-developed and well-nourished.  No distress.  HEENT: head atraumatic, normocephalic, pupils equal and reactive to light, ears normal TM  neck supple, throat within normal limits Cardiovascular: Normal rate, regular rhythm and normal heart sounds.  No murmur heard. No BLE edema. Pulmonary/Chest: Effort normal, bilateral wheezing and rhonchi, no crackles  Abdominal: Soft.  There is no tenderness. Psychiatric: Patient seems anxious  behavior is normal. Judgment and thought content   normal. PHQ2/9: Depression screen [facility] 2/9 03/25/2018 03/10/2017 04/08/2016 10/09/2015 07/16/2015  Decreased Interest 0 0 0 0 0  Down, Depressed, Hopeless 0 0 0 0 1  PHQ - 2 Score 0 0 0 0 1  Altered sleeping 0 - - - -  Tired, decreased energy 1 - - - -  Change in appetite 0 - - - -  Feeling bad or failure about yourself  0 - - - -  Trouble concentrating 0 - - - -  Moving slowly or fidgety/restless 0 - - - -  Suicidal thoughts 0 - - - -  PHQ-9 Score 1 - - - -  Difficult doing work/chores Not difficult at all - - -  -   GAD 7 : Generalized Anxiety Score 03/25/2018  Nervous, Anxious, on Edge 0  Control/stop worrying 0  Worry too much - different things 0  Trouble relaxing 1  Restless 0  Easily annoyed or irritable 0  Afraid - awful might happen 0  Total GAD 7 Score 1  Anxiety Difficulty Somewhat difficult     Fall Risk: Fall Risk  03/25/2018 03/10/2017 04/08/2016 10/09/2015 07/16/2015  Falls in the past year? 0 Yes No No Yes  Number falls in past yr: 0 1 - - 1  Injury with Fall? 0 No - - No    Functional Status Survey: Is the patient deaf or have difficulty hearing?: No Does the patient have difficulty seeing, even when wearing glasses/contacts?: Yes Does the patient have difficulty concentrating, remembering, or making decisions?: No Does the patient have difficulty walking or climbing stairs?: No Does the patient have difficulty dressing or bathing?: No Does the patient have difficulty doing errands alone such as visiting a doctor's office or shopping?: No    Assessment & Plan  1. Essential hypertension  - Ambulatory referral to Chronic Care Management Services  sending higher dose of lisinopril, from 20 mg to 40 mg and monitor at home   2. Chronic anxiety  - citalopram (CELEXA) 20 MG tablet; Take 1 tablet (20 mg total) by mouth daily.  Dispense: 90 tablet; Refill: 0 - hydrOXYzine (ATARAX/VISTARIL) 10 MG tablet; Take 1 tablet (10 mg total) by mouth daily as needed for anxiety.  Dispense: 30 tablet; Refill: 0 - Ambulatory referral to Chronic Care Management Services  3. Current tobacco use  Discussed importance of quitting   4. COPD with acute exacerbation (HCC)  - albuterol (PROVENTIL HFA) 108 (90 Base) MCG/ACT inhaler; Inhale 2 puffs into the lungs every 4 (four) hours  as needed for wheezing or shortness of breath.  Dispense: 1 Inhaler; Refill: 1 - budesonide-formoterol (SYMBICORT) 160-4.5 MCG/ACT inhaler; Inhale 2 puffs into the lungs 2 (two) times daily.  Dispense: 1 Inhaler;  Refill: 2 - predniSONE (DELTASONE) 10 MG tablet; Take 1 tablet (10 mg total) by mouth 2 (two) times daily with a meal.  Dispense: 10 tablet; Refill: 0 Excuse for work for the next 14 days even though low suspicion for COVID-19   5. Financial difficulty  - Ambulatory referral to Chronic Care Management Services

## 2018-03-26 NOTE — Telephone Encounter (Signed)
Patient notified

## 2018-03-29 ENCOUNTER — Ambulatory Visit: Payer: Self-pay | Admitting: Pharmacist

## 2018-03-29 DIAGNOSIS — Z599 Problem related to housing and economic circumstances, unspecified: Secondary | ICD-10-CM

## 2018-03-29 DIAGNOSIS — Z598 Other problems related to housing and economic circumstances: Secondary | ICD-10-CM

## 2018-03-29 DIAGNOSIS — J441 Chronic obstructive pulmonary disease with (acute) exacerbation: Secondary | ICD-10-CM

## 2018-03-30 NOTE — Chronic Care Management (AMB) (Signed)
  Care Management   Note  03/30/2018 Name: Leslie Evans MRN: [id number] DOB: Mar 19, 1955  Leslie Evans is a pleasant 63 y.o. year old female patient of  Lada, Satira Anis, MD. Seen by Dr. Steele Sizer 03/25/18 for COPD exacerbation.   Dr. Ancil Boozer asked the CCM team to consult today for assistance with medication affordability 2/2 uninsured. Leslie Evans agreed that care management services would be helpful to them for assistance applying to medication programs.   Goals Addressed            This Visit's Progress   . I couldn't pick up my inhalers because they were too expensive (pt-stated)       Current Barriers:  . Financial . Uninsured   Pharmacist Clinical Goal(s): Over the next 14 days, Leslie Evans will provide the necessary supplementary documents (proof of out of pocket prescription expenditure, proof of household income) needed for medication assistance applications to CCM pharmacist.   Interventions: . CCM pharmacist will apply for medication assistance program for Symbicort, Proventil made by [employer].   Patient Self Care Activities:  Marland Kitchen Gather necessary documents needed to apply for medication assistance  Initial goal documentation          Plan:I have scheduled a call to [employer] next week. Patient agreed to services and verbal consent obtained.  [name] was given information about Care Management services today including:  1. Case Management services includes personalized support from designated clinical staff supervised by her physician, including individualized plan of care and coordination with other care providers 2. 24/7 contact phone numbers for assistance for urgent and routine care needs. 3. The patient may stop case management services at any time by phone call to the office staff.  Patient agreed to services and verbal consent obtained.     Follow Up Plan: The CM pharmacist will reach out to the patient again over the next 10-14 days to ensure  receipt of medication assistance applications   Ruben Reason, PharmD Clinical Pharmacist [place] [phone]

## 2018-03-30 NOTE — Patient Instructions (Signed)
Ms. Buesing was given information about Care Management services today including:  1. Case Management services includes personalized support from designated clinical staff supervised by her physician, including individualized plan of care and coordination with other care providers 2. 24/7 contact phone numbers for assistance for urgent and routine care needs. 3. The patient may stop case management services at any time by phone call to the office staff.  Patient agreed to services and verbal consent obtained.     Goals Addressed            This Visit's Progress   . I couldn't pick up my inhalers because they were too expensive (pt-stated)       Current Barriers:  . Financial . Uninsured   Pharmacist Clinical Goal(s): Over the next 14 days, Ms.. Darrough will provide the necessary supplementary documents (proof of out of pocket prescription expenditure, proof of household income) needed for medication assistance applications to CCM pharmacist.   Interventions: . CCM pharmacist will apply for medication assistance program for Symbicort, Proventil made by [employer].   Patient Self Care Activities:  Marland Kitchen Gather necessary documents needed to apply for medication assistance  Initial goal documentation          The patient verbalized understanding of instructions provided today and declined a print copy of patient instruction materials.

## 2018-04-01 ENCOUNTER — Other Ambulatory Visit: Payer: Self-pay

## 2018-04-01 ENCOUNTER — Ambulatory Visit: Payer: Self-pay | Admitting: Pharmacist

## 2018-04-01 DIAGNOSIS — J441 Chronic obstructive pulmonary disease with (acute) exacerbation: Secondary | ICD-10-CM

## 2018-04-01 DIAGNOSIS — Z598 Other problems related to housing and economic circumstances: Secondary | ICD-10-CM

## 2018-04-01 DIAGNOSIS — Z599 Problem related to housing and economic circumstances, unspecified: Secondary | ICD-10-CM

## 2018-04-07 ENCOUNTER — Telehealth: Payer: Self-pay | Admitting: Family Medicine

## 2018-04-07 ENCOUNTER — Encounter: Payer: Self-pay | Admitting: Family Medicine

## 2018-04-07 NOTE — Telephone Encounter (Signed)
Copied from [place] [id number]. Topic: General - Other >> Apr 07, 2018 10:44 AM Keene Breath wrote: Reason for CRM: Patient called to inform the doctor that she needs a note, per her employer, that specifically says it is ok for her to return to work at the [employer] that she is employed with.  Patient stated that the note that she originally got did not say specifically those words.  Patient also stated that she can pick up the note because she does not have My Chart or access to a computer.  Please advise and call patient and let her know when she would be able to get the note.  CB# [phone]

## 2018-04-07 NOTE — Telephone Encounter (Signed)
Patient notified and will call us once she is here to pick up her letter for work.

## 2018-04-07 NOTE — Telephone Encounter (Signed)
Sending to provider who wrote her out of work Thank you

## 2018-04-07 NOTE — Chronic Care Management (AMB) (Signed)
Care Management   Note  04/07/2018 Name: Leslie Evans MRN: [id number] DOB: [birth year]  Evans for referral: Medication assistance  Referral source: Dr. Steele Evans Referral medication(s): Symbicort, Proventil Current insurance: uninsured   PMH: significant for COPD with asthma, HTN, IBS, tobacco abuse   Objective: Allergies  Allergen Reactions  . Gabapentin Dermatitis  . Ibuprofen Nausea And Vomiting  . Lamisil [Terbinafine] Swelling  . Pregabalin Other (See Comments)  . Tetracycline Other (See Comments)    Medications Reviewed Today    Reviewed by Leslie Evans, [place] (Certified Medical Assistant) on 03/25/18 at 1123  Med List Status: <None>  Medication Order Taking? Sig Documenting Provider Last Dose Status Informant  acetaminophen (TYLENOL) 500 MG tablet [id number] Yes Take 500 mg by mouth every 6 (six) hours as needed. [provider] Taking Active   albuterol (PROVENTIL HFA) 108 (90 Base) MCG/ACT inhaler [id number] Yes Inhale 2 puffs into the lungs every 4 (four) hours as needed for wheezing or shortness of breath. Arnetha Courser, MD Taking Active   budesonide-formoterol [facility]) 160-4.5 MCG/ACT inhaler [id number] Yes Inhale 2 puffs into the lungs 2 (two) times daily. Arnetha Courser, MD Taking Active   cholestyramine Lucrezia Starch) 4 g packet [id number] Yes Take 1 packet (4 g total) by mouth 2 (two) times daily. (build up gradually, just once a day for 1 week, then twice a day) Lada, Satira Anis, MD Taking Active   DULoxetine (CYMBALTA) 30 MG capsule [id number] No Take 1 capsule (30 mg total) by mouth daily. For one week, then two pills daily  Patient not taking:  Reported on 03/25/2018   Arnetha Courser, MD Not Taking Active   hydrocortisone (ANUSOL-HC) 25 MG suppository [id number] Yes Place 25 mg rectally 2 (two) times daily as needed for hemorrhoids or itching. [provider] Taking Active   lisinopril (PRINIVIL,ZESTRIL) 20 MG tablet [id number] Yes Take 1  tablet (20 mg total) by mouth daily. Arnetha Courser, MD Taking Active   Melatonin-Pyridoxine (MELATIN PO) [id number] No Take 1 tablet by mouth at bedtime as needed. [provider] Not Taking Active Self  ranitidine (ZANTAC) 150 MG tablet [id number] Yes Take 1 tablet (150 mg total) by mouth 2 (two) times daily. Roselee Nova, MD Taking Active           Assessment:  Medication Assistance Findings:  Extra Help:   '[]'  Already receiving Full Extra Help  '[]'  Already receiving Partial Extra Help  '[]'  Eligible based on reported income and assets  '[x]'  Not Eligible based on reported income and assets  Patient Assistance Programs: 1) Proventil made by [employer] o Income requirement met: '[x]'  Yes '[]'  No '[]'  Unknown o Out-of-pocket prescription expenditure met:    '[]'  Yes '[]'  No  '[]'  Unknown  '[x]'  Not applicable - uninsured        2)  Symbicort made by [employer] o Income requirement met: '[x]'  Yes '[]'  No  '[]'  Unknown o Out-of-pocket prescription expenditure met:   '[x]'  Yes '[]'  No   '[]'  Unknown '[]'  Not applicable - Uninsured    Additional medication assistance options reviewed with patient as warranted:  Coupon and Agricultural consultant  Goals Addressed            This Visit's Progress   . I couldn't pick up my inhalers because they were too expensive (pt-stated)       Current Barriers:  . Financial . Uninsured   Pharmacist Clinical Goal(s): Over the next 14 days, Ms.. Leslie Evans will  provide the necessary supplementary documents (proof of out of pocket prescription expenditure, proof of household income) needed for medication assistance applications to CCM pharmacist.   *Due to CXWNP20, application will have to be completed via mail*   Interventions: . CCM pharmacist will apply for medication assistance program for Symbicort, Proventil made by [employer].   Patient Self Care Activities:  Marland Kitchen Gather necessary documents needed to apply for medication assistance  Initial goal documentation          Plan: Patient is to provide additional documents as necessary.   Follow up:  14 days  Leslie Evans, PharmD Clinical Pharmacist [facility] [phone]

## 2018-07-12 ENCOUNTER — Other Ambulatory Visit: Payer: Self-pay | Admitting: Family Medicine

## 2018-07-12 DIAGNOSIS — F419 Anxiety disorder, unspecified: Secondary | ICD-10-CM

## 2018-07-12 NOTE — Telephone Encounter (Signed)
Please schedule routine follow-up within the next month

## 2018-08-02 ENCOUNTER — Other Ambulatory Visit: Payer: Self-pay

## 2018-08-02 ENCOUNTER — Encounter: Payer: Self-pay | Admitting: Nurse Practitioner

## 2018-08-02 ENCOUNTER — Ambulatory Visit (INDEPENDENT_AMBULATORY_CARE_PROVIDER_SITE_OTHER): Payer: Self-pay | Admitting: Nurse Practitioner

## 2018-08-02 VITALS — BP 130/68 | HR 77 | Temp 97.1°F | Resp 14 | Ht 65.0 in | Wt 132.1 lb

## 2018-08-02 DIAGNOSIS — F419 Anxiety disorder, unspecified: Secondary | ICD-10-CM

## 2018-08-02 DIAGNOSIS — K589 Irritable bowel syndrome without diarrhea: Secondary | ICD-10-CM

## 2018-08-02 DIAGNOSIS — J449 Chronic obstructive pulmonary disease, unspecified: Secondary | ICD-10-CM

## 2018-08-02 DIAGNOSIS — I10 Essential (primary) hypertension: Secondary | ICD-10-CM

## 2018-08-02 DIAGNOSIS — F410 Panic disorder [episodic paroxysmal anxiety] without agoraphobia: Secondary | ICD-10-CM

## 2018-08-02 MED ORDER — HYDROXYZINE PAMOATE 25 MG PO CAPS: 25.0000 mg | ORAL_CAPSULE | Freq: Two times a day (BID) | ORAL | 3 refills | Status: DC | PRN

## 2018-08-02 MED ORDER — ALBUTEROL SULFATE HFA 108 (90 BASE) MCG/ACT IN AERS: 2.0000 | INHALATION_SPRAY | RESPIRATORY_TRACT | 1 refills | Status: DC | PRN

## 2018-08-02 MED ORDER — BUDESONIDE-FORMOTEROL FUMARATE 160-4.5 MCG/ACT IN AERO: 2.0000 | INHALATION_SPRAY | Freq: Two times a day (BID) | RESPIRATORY_TRACT | 2 refills | Status: DC

## 2018-08-02 MED ORDER — CITALOPRAM HYDROBROMIDE 40 MG PO TABS: 40.0000 mg | ORAL_TABLET | Freq: Every day | ORAL | 1 refills | Status: DC

## 2018-08-02 MED ORDER — LISINOPRIL 40 MG PO TABS: 40.0000 mg | ORAL_TABLET | Freq: Every day | ORAL | 1 refills | Status: DC

## 2018-08-02 NOTE — Patient Instructions (Addendum)
- consider trying the nicotine patches as a way to help you quit.   Low-FODMAP Eating Plan FODMAPs (fermentable oligosaccharides, disaccharides, monosaccharides, and polyols) are sugars that are hard for some people to digest. A low-FODMAP eating plan may help some people who have bowel (intestinal) diseases to manage their symptoms. This meal plan can be complicated to follow. Work with a diet and nutrition specialist (dietitian) to make a low-FODMAP eating plan that is right for you. A dietitian can make sure that you get enough nutrition from this diet. What are tips for following this plan? Reading food labels  Check labels for hidden FODMAPs such as: ? High-fructose syrup. ? Honey. ? Agave. ? Natural fruit flavors. ? Onion or garlic powder.  Choose low-FODMAP foods that contain 3-4 grams of fiber per serving.  Check food labels for serving sizes. Eat only one serving at a time to make sure FODMAP levels stay low. Meal planning  Follow a low-FODMAP eating plan for up to 6 weeks, or as told by your health care provider or dietitian.  To follow the eating plan: 1. Eliminate high-FODMAP foods from your diet completely. 2. Gradually reintroduce high-FODMAP foods into your diet one at a time. Most people should wait a few days after introducing one high-FODMAP food before they introduce the next high-FODMAP food. Your dietitian can recommend how quickly you may reintroduce foods. 3. Keep a daily record of what you eat and drink, and make note of any symptoms that you have after eating. 4. Review your daily record with a dietitian regularly. Your dietitian can help you identify which foods you can eat and which foods you should avoid. General tips  Drink enough fluid each day to keep your urine pale yellow.  Avoid processed foods. These often have added sugar and may be high in FODMAPs.  Avoid most dairy products, whole grains, and sweeteners.  Work with a dietitian to make sure you  get enough fiber in your diet. Recommended foods Grains  Gluten-free grains, such as rice, oats, buckwheat, quinoa, corn, polenta, and millet. Gluten-free pasta, bread, or cereal. Rice noodles. Corn tortillas. Vegetables  Eggplant, zucchini, cucumber, peppers, green beans, Brussels sprouts, bean sprouts, lettuce, arugula, kale, Swiss chard, spinach, collard greens, bok choy, summer squash, potato, and tomato. Limited amounts of corn, carrot, and sweet potato. Green parts of scallions. Fruits  Bananas, oranges, lemons, limes, blueberries, raspberries, strawberries, grapes, cantaloupe, honeydew melon, kiwi, papaya, passion fruit, and pineapple. Limited amounts of dried cranberries, banana chips, and shredded coconut. Dairy  Lactose-free milk, yogurt, and kefir. Lactose-free cottage cheese and ice cream. Non-dairy milks, such as almond, coconut, hemp, and rice milk. Yogurts made of non-dairy milks. Limited amounts of goat cheese, brie, mozzarella, parmesan, swiss, and other hard cheeses. Meats and other protein foods  Unseasoned beef, pork, poultry, or fish. Eggs. Berniece Salines. Tofu (firm) and tempeh. Limited amounts of nuts and seeds, such as almonds, walnuts, [place] nuts, pecans, peanuts, pumpkin seeds, chia seeds, and sunflower seeds. Fats and oils  Butter-free spreads. Vegetable oils, such as olive, canola, and sunflower oil. Seasoning and other foods  Artificial sweeteners with names that do not end in "ol" such as aspartame, saccharine, and stevia. Maple syrup, white table sugar, raw sugar, brown sugar, and molasses. Fresh basil, coriander, parsley, rosemary, and thyme. Beverages  Water and mineral water. Sugar-sweetened soft drinks. Small amounts of orange juice or cranberry juice. Black and green tea. Most dry wines. Coffee. This may not be a complete list of low-FODMAP  foods. Talk with your dietitian for more information. Foods to avoid Grains  Wheat, including kamut, durum, and  semolina. Barley and bulgur. Couscous. Wheat-based cereals. Wheat noodles, bread, crackers, and pastries. Vegetables  Chicory root, artichoke, asparagus, cabbage, snow peas, sugar snap peas, mushrooms, and cauliflower. Onions, garlic, leeks, and the white part of scallions. Fruits  Fresh, dried, and juiced forms of apple, pear, watermelon, peach, plum, cherries, apricots, blackberries, boysenberries, figs, nectarines, and mango. Avocado. Dairy  Milk, yogurt, ice cream, and soft cheese. Cream and sour cream. Milk-based sauces. Custard. Meats and other protein foods  Fried or fatty meat. Sausage. Cashews and pistachios. Soybeans, baked beans, black beans, chickpeas, kidney beans, fava beans, navy beans, lentils, and split peas. Seasoning and other foods  Any sugar-free gum or candy. Foods that contain artificial sweeteners such as sorbitol, mannitol, isomalt, or xylitol. Foods that contain honey, high-fructose corn syrup, or agave. Bouillon, vegetable stock, beef stock, and chicken stock. Garlic and onion powder. Condiments made with onion, such as hummus, chutney, pickles, relish, salad dressing, and salsa. Tomato paste. Beverages  Chicory-based drinks. Coffee substitutes. Chamomile tea. Fennel tea. Sweet or fortified wines such as port or sherry. Diet soft drinks made with isomalt, mannitol, maltitol, sorbitol, or xylitol. Apple, pear, and mango juice. Juices with high-fructose corn syrup. This may not be a complete list of high-FODMAP foods. Talk with your dietitian to discuss what dietary choices are best for you.  Summary  A low-FODMAP eating plan is a short-term diet that eliminates FODMAPs from your diet to help ease symptoms of certain bowel diseases.  The eating plan usually lasts up to 6 weeks. After that, high-FODMAP foods are restarted gradually, one at a time, so you can find out which may be causing symptoms.  A low-FODMAP eating plan can be complicated. It is best to work with  a dietitian who has experience with this type of plan. This information is not intended to replace advice given to you by your health care provider. Make sure you discuss any questions you have with your health care provider. Document Released: 08/19/2016 Document Revised: 12/05/2016 Document Reviewed: 08/19/2016 Elsevier Patient Education  2020 [employer].

## 2018-08-02 NOTE — Progress Notes (Signed)
Name: Leslie Evans   MRN: [id number]    DOB: 02-05-1955   Date:08/02/2018       Progress Note  Subjective  Chief Complaint  Chief Complaint  Patient presents with  . Follow-up    HPI  Hypertension Patient is on lisinopril 40mg  daily.  Takes medications as prescribed with nomissed doses a month.  She is compliant with low-salt diet.  Denies chest pain, headaches, blurry vision, dizziness routinely- states she does get it with panic attacks- resolved with hydroxyzine and deep breath.  BP Readings from Last 3 Encounters:  08/02/18 130/68  03/25/18 (!) 150/100  03/10/17 122/68   COPD with asthma Takes Symbicort 2 puffs BID and albuterol just as needed. States just needs it rarely maybe if she gets sick.  Smoking less than half a pack a day.   Anxiety Takes celexa  Takes PRN hydroxzyine.    Lab Results  Component Value Date   CHOL 150 09/25/2015   HDL 54 09/25/2015   LDLCALC 82 09/25/2015   TRIG 72 09/25/2015   CHOLHDL 2.8 09/25/2015     PHQ2/9: Depression screen PHQ 2/9 08/02/2018 03/25/2018 03/10/2017 04/08/2016 10/09/2015  Decreased Interest 0 0 0 0 0  Down, Depressed, Hopeless 0 0 0 0 0  PHQ - 2 Score 0 0 0 0 0  Altered sleeping 0 0 - - -  Tired, decreased energy 0 1 - - -  Change in appetite 0 0 - - -  Feeling bad or failure about yourself  0 0 - - -  Trouble concentrating 0 0 - - -  Moving slowly or fidgety/restless 0 0 - - -  Suicidal thoughts 0 0 - - -  PHQ-9 Score 0 1 - - -  Difficult doing work/chores Not difficult at all Not difficult at all - - -     PHQ reviewed. Negative  Patient Active Problem List   Diagnosis Date Noted  . Essential hypertension 11/27/2014  . IBS (irritable bowel syndrome) 08/21/2014  . Lumbar disc disease with radiculopathy 08/21/2014  . Chronic anxiety 08/21/2014  . COPD with asthma ([place]) 07/21/2013  . Current tobacco use 07/21/2013    Past Medical History:  Diagnosis Date  . Anxiety   . Asthma   . Cervicalgia   .  Hyperlipidemia     History reviewed. No pertinent surgical history.  Social History   Tobacco Use  . Smoking status: Current Every Day Smoker  . Smokeless tobacco: Never Used  . Tobacco comment: less than 1/2 pack per day  Substance Use Topics  . Alcohol use: Yes    Alcohol/week: 0.0 standard drinks     Current Outpatient Medications:  .  acetaminophen (TYLENOL) 500 MG tablet, Take 500 mg by mouth every 6 (six) hours as needed., Disp: , Rfl:  .  albuterol (PROVENTIL HFA) 108 (90 Base) MCG/ACT inhaler, Inhale 2 puffs into the lungs every 4 (four) hours as needed for wheezing or shortness of breath., Disp: 8 g, Rfl: 1 .  budesonide-formoterol (SYMBICORT) 160-4.5 MCG/ACT inhaler, Inhale 2 puffs into the lungs 2 (two) times daily., Disp: 3 Inhaler, Rfl: 2 .  lisinopril (ZESTRIL) 40 MG tablet, Take 1 tablet (40 mg total) by mouth daily., Disp: 90 tablet, Rfl: 1 .  citalopram (CELEXA) 40 MG tablet, Take 1 tablet (40 mg total) by mouth daily., Disp: 90 tablet, Rfl: 1 .  hydrOXYzine (VISTARIL) 25 MG capsule, Take 1-2 capsules (25-50 mg total) by mouth 2 (two) times daily as needed for  anxiety., Disp: 30 capsule, Rfl: 3 .  Melatonin-Pyridoxine (MELATIN PO), Take 1 tablet by mouth at bedtime as needed., Disp: , Rfl:   Allergies  Allergen Reactions  . Gabapentin Dermatitis  . Ibuprofen Nausea And Vomiting  . Lamisil [Terbinafine] Swelling  . Pregabalin Other (See Comments)  . Tetracycline Other (See Comments)    ROS    No other specific complaints in a complete review of systems (except as listed in HPI above).  Objective  Vitals:   08/02/18 1044  BP: 130/68  Pulse: 77  Resp: 14  Temp: (!) 97.1 F (36.2 C)  TempSrc: Temporal  SpO2: 97%  Weight: 132 lb 1.6 oz (59.9 kg)  Height: 5\' 5"  (1.651 m)     Body mass index is 21.98 kg/m.  Nursing Note and Vital Signs reviewed.  Physical Exam Vitals signs reviewed.  Constitutional:      Appearance: She is well-developed.   HENT:     Head: Normocephalic and atraumatic.  Neck:     Musculoskeletal: Normal range of motion and neck supple.     Vascular: No carotid bruit.  Cardiovascular:     Heart sounds: Normal heart sounds.  Pulmonary:     Effort: Pulmonary effort is normal.     Breath sounds: Normal breath sounds.  Abdominal:     General: Bowel sounds are normal.     Palpations: Abdomen is soft.     Tenderness: There is no abdominal tenderness.  Musculoskeletal: Normal range of motion.  Skin:    General: Skin is warm and dry.     Capillary Refill: Capillary refill takes less than 2 seconds.  Neurological:     Mental Status: She is alert and oriented to person, place, and time.     GCS: GCS eye subscore is 4. GCS verbal subscore is 5. GCS motor subscore is 6.     Sensory: No sensory deficit.  Psychiatric:        Speech: Speech normal.        Behavior: Behavior normal.        Thought Content: Thought content normal.        Judgment: Judgment normal.        No results found for this or any previous visit (from the past 48 hour(s)).  Assessment & Plan  1. Essential hypertension stable - lisinopril (ZESTRIL) 40 MG tablet; Take 1 tablet (40 mg total) by mouth daily.  Dispense: 90 tablet; Refill: 1  2. COPD with asthma ([place]) - budesonide-formoterol (SYMBICORT) 160-4.5 MCG/ACT inhaler; Inhale 2 puffs into the lungs 2 (two) times daily.  Dispense: 3 Inhaler; Refill: 2 - albuterol (PROVENTIL HFA) 108 (90 Base) MCG/ACT inhaler; Inhale 2 puffs into the lungs every 4 (four) hours as needed for wheezing or shortness of breath.  Dispense: 8 g; Refill: 1  3. Irritable bowel syndrome, unspecified type Worse with anxiety- will treat anxiety. Low fodmap  4. Chronic anxiety - citalopram (CELEXA) 40 MG tablet; Take 1 tablet (40 mg total) by mouth daily.  Dispense: 90 tablet; Refill: 1  5. Panic attacks - hydrOXYzine (VISTARIL) 25 MG capsule; Take 1-2 capsules (25-50 mg total) by mouth 2 (two) times daily  as needed for anxiety.  Dispense: 30 capsule; Refill: 3

## 2018-08-11 ENCOUNTER — Other Ambulatory Visit: Payer: Self-pay

## 2018-08-11 DIAGNOSIS — Z20822 Contact with and (suspected) exposure to covid-19: Secondary | ICD-10-CM

## 2018-08-12 LAB — NOVEL CORONAVIRUS, NAA: SARS-CoV-2, NAA: NOT DETECTED

## 2018-10-28 ENCOUNTER — Other Ambulatory Visit: Payer: Self-pay

## 2018-10-28 DIAGNOSIS — I10 Essential (primary) hypertension: Secondary | ICD-10-CM

## 2018-10-29 MED ORDER — LISINOPRIL 40 MG PO TABS: 40.0000 mg | ORAL_TABLET | Freq: Every day | ORAL | 0 refills | Status: DC

## 2018-10-29 NOTE — Telephone Encounter (Signed)
Pt called in to check her refill on this med

## 2019-01-03 ENCOUNTER — Other Ambulatory Visit: Payer: Self-pay | Admitting: Family Medicine

## 2019-01-03 ENCOUNTER — Ambulatory Visit (INDEPENDENT_AMBULATORY_CARE_PROVIDER_SITE_OTHER): Payer: Self-pay | Admitting: Family Medicine

## 2019-01-03 ENCOUNTER — Encounter: Payer: Self-pay | Admitting: Family Medicine

## 2019-01-03 ENCOUNTER — Other Ambulatory Visit: Payer: Self-pay

## 2019-01-03 VITALS — BP 132/74 | HR 78 | Temp 97.8°F | Resp 16 | Ht 65.0 in | Wt 119.4 lb

## 2019-01-03 DIAGNOSIS — Z720 Tobacco use: Secondary | ICD-10-CM

## 2019-01-03 DIAGNOSIS — F410 Panic disorder [episodic paroxysmal anxiety] without agoraphobia: Secondary | ICD-10-CM

## 2019-01-03 DIAGNOSIS — J449 Chronic obstructive pulmonary disease, unspecified: Secondary | ICD-10-CM

## 2019-01-03 DIAGNOSIS — F419 Anxiety disorder, unspecified: Secondary | ICD-10-CM

## 2019-01-03 DIAGNOSIS — K581 Irritable bowel syndrome with constipation: Secondary | ICD-10-CM

## 2019-01-03 DIAGNOSIS — I10 Essential (primary) hypertension: Secondary | ICD-10-CM

## 2019-01-03 DIAGNOSIS — Z13220 Encounter for screening for lipoid disorders: Secondary | ICD-10-CM

## 2019-01-03 LAB — COMPLETE METABOLIC PANEL WITH GFR
AG Ratio: 2 (calc) (ref 1.0–2.5)
ALT: 13 U/L (ref 6–29)
AST: 13 U/L (ref 10–35)
Albumin: 4.1 g/dL (ref 3.6–5.1)
Alkaline phosphatase (APISO): 76 U/L (ref 37–153)
BUN: 7 mg/dL (ref 7–25)
CO2: 28 mmol/L (ref 20–32)
Calcium: 9.8 mg/dL (ref 8.6–10.4)
Chloride: 111 mmol/L — ABNORMAL HIGH (ref 98–110)
Creat: 0.73 mg/dL (ref 0.50–0.99)
GFR, Est African American: 102 mL/min/{1.73_m2} (ref >=60)
GFR, Est Non African American: 88 mL/min/{1.73_m2} (ref >=60)
Globulin: 2.1 g/dL (calc) (ref 1.9–3.7)
Glucose, Bld: 87 mg/dL (ref 65–99)
Potassium: 4.2 mmol/L (ref 3.5–5.3)
Sodium: 144 mmol/L (ref 135–146)
Total Bilirubin: 0.6 mg/dL (ref 0.2–1.2)
Total Protein: 6.2 g/dL (ref 6.1–8.1)

## 2019-01-03 LAB — LIPID PANEL
Cholesterol: 172 mg/dL (ref <200)
HDL: 52 mg/dL (ref >=50)
LDL Cholesterol (Calc): 100 mg/dL (calc) — ABNORMAL HIGH
Non-HDL Cholesterol (Calc): 120 mg/dL (calc) (ref <130)
Total CHOL/HDL Ratio: 3.3 (calc) (ref <5.0)
Triglycerides: 100 mg/dL (ref <150)

## 2019-01-03 MED ORDER — HYDROCORTISONE ACETATE 25 MG RE SUPP: 25.0000 mg | Freq: Two times a day (BID) | RECTAL | 1 refills | Status: DC

## 2019-01-03 MED ORDER — HYDROXYZINE PAMOATE 25 MG PO CAPS: 25.0000 mg | ORAL_CAPSULE | Freq: Two times a day (BID) | ORAL | 3 refills | Status: DC | PRN

## 2019-01-03 MED ORDER — ALBUTEROL SULFATE HFA 108 (90 BASE) MCG/ACT IN AERS: 2.0000 | INHALATION_SPRAY | RESPIRATORY_TRACT | 1 refills | Status: DC | PRN

## 2019-01-03 MED ORDER — LISINOPRIL 40 MG PO TABS: 40.0000 mg | ORAL_TABLET | Freq: Every day | ORAL | 0 refills | Status: DC

## 2019-01-03 MED ORDER — BUDESONIDE-FORMOTEROL FUMARATE 160-4.5 MCG/ACT IN AERO: 2.0000 | INHALATION_SPRAY | Freq: Two times a day (BID) | RESPIRATORY_TRACT | 2 refills | Status: DC

## 2019-01-03 MED ORDER — CITALOPRAM HYDROBROMIDE 40 MG PO TABS: 40.0000 mg | ORAL_TABLET | Freq: Every day | ORAL | 1 refills | Status: DC

## 2019-01-03 MED ORDER — BUSPIRONE HCL 7.5 MG PO TABS: ORAL_TABLET | ORAL | 1 refills | Status: DC

## 2019-01-03 NOTE — Telephone Encounter (Signed)
Pt states the BUSPAR 7.5 was not called in.   But it was the instructions that are wrong.  "Take 1 tablet once daily at night for 7 days, then increase to 1 tablet once daily." Please call pharmacy to clarify   Pt also states she asked Bonnita Nasuti to have Raquel Sarna refill her cyclobenzaprine (FLEXERIL) 10 MG tablet [id number]  [place], [place] Phone:  [phone]  Fax:  [phone]    (pt hopes this can be done asap, before she leaves town)  In addition, pt states her AVS states IBS with constipation. Pt states she has ongoing diarrhea.   Pt would like this changed in her record.

## 2019-01-03 NOTE — Progress Notes (Signed)
Name: Leslie Evans   MRN: [id number]    DOB: [birth year]   Date:01/03/2019       Progress Note  Subjective  Chief Complaint  Chief Complaint  Patient presents with  . Follow-up  . Medication Refill    HPI  Hypertension Patient is on lisinopril 40mg  daily.  Checks BP at home and notes had a spike a few days ago when stressed, otherwise has been normal.  Takes medications as prescribed with no missed doses a month.  She is compliant with low-salt diet.  Denies chest pain, headaches, blurry vision, dizziness routinely- states she does get it with panic attacks- resolved with hydroxyzine and deep breath.   COPD with asthma Takes Symbicort 2 puffs BID and albuterol just as needed. States just needs it rarely maybe if she gets sick.  Smoking less than half a pack a day - has tried in the past to quit.  She does get occasional shortness of breath with the cold weather.  Anxiety Takes celexa daily.  Takes PRN hydroxzyine - states hydroxyzine makes her sleepy, but does not help with her anxiety.  She states she used to take xanax and prefers this.  She is doing meditation and relaxation exercises.  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 She has had 3 panic attacks in the last month. She does not want a referral to psychiatry or counseling at this time.   Patient Active Problem List   Diagnosis Date Noted  . Essential hypertension 11/27/2014  . IBS (irritable bowel syndrome) 08/21/2014  . Lumbar disc disease with radiculopathy 08/21/2014  . Chronic anxiety 08/21/2014  . COPD with asthma ([place]) 07/21/2013  . Current tobacco use 07/21/2013    No past surgical history on file.  Family History  Problem Relation Age of Onset  . Cancer Mother   . Diabetes Mother   . Heart disease Mother   . Cancer Father   . Drug abuse Brother   . Diabetes Maternal Grandmother   . COPD Maternal Grandfather   . Cancer Brother     Social History   Socioeconomic History  .  Marital status: Divorced    Spouse name: Not on file  . Number of children: 3  . Years of education: Not on file  . Highest education level: Not on file  Occupational History  . 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 Smoking status: Current Every Day Smoker  . Smokeless tobacco: Never Used  . Tobacco comment: less than 1/2 pack per day  Substance and Sexual Activity  . Alcohol use: Yes    Alcohol/week: 0.0 standard drinks  . Drug use: No  . Sexual activity: Not Currently  Other Topics Concern  . 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 Difficulty of Paying Living Expenses: Hard  Food Insecurity: Food Insecurity Present  . Worried About Charity fundraiser in the Last Year: Sometimes true  . Ran Out of Food in the Last Year: Sometimes true  Transportation Needs: No Transportation Needs  . Lack of Transportation (Medical): No  . Lack of Transportation (Non-Medical): No  Physical Activity:   . Days of Exercise per Week: Not on file  . Minutes of Exercise per Session: Not on file  Stress:   .  Feeling of Stress : Not on file  Social Connections:   . Frequency of Communication with Friends and Family: Not on file  . Frequency of Social Gatherings with Friends and Family: Not on file  . Attends Religious Services: Not on file  . Active Member of Clubs or Organizations: Not on file  . Attends Archivist Meetings: Not on file  . Marital Status: Not on file  Intimate Partner Violence:   . Fear of Current or Ex-Partner: Not on file  . Emotionally Abused: Not on file  . Physically Abused: Not on file  . Sexually Abused: Not on file     Current Outpatient Medications:  .  acetaminophen (TYLENOL) 500 MG tablet, Take 500 mg by mouth every 6 (six) hours as needed., Disp: , Rfl:  .  albuterol (PROVENTIL  HFA) 108 (90 Base) MCG/ACT inhaler, Inhale 2 puffs into the lungs every 4 (four) hours as needed for wheezing or shortness of breath., Disp: 8 g, Rfl: 1 .  budesonide-formoterol (SYMBICORT) 160-4.5 MCG/ACT inhaler, Inhale 2 puffs into the lungs 2 (two) times daily., Disp: 3 Inhaler, Rfl: 2 .  citalopram (CELEXA) 40 MG tablet, Take 1 tablet (40 mg total) by mouth daily., Disp: 90 tablet, Rfl: 1 .  hydrOXYzine (VISTARIL) 25 MG capsule, Take 1-2 capsules (25-50 mg total) by mouth 2 (two) times daily as needed for anxiety., Disp: 30 capsule, Rfl: 3 .  lisinopril (ZESTRIL) 40 MG tablet, Take 1 tablet (40 mg total) by mouth daily., Disp: 90 tablet, Rfl: 0 .  Melatonin-Pyridoxine (MELATIN PO), Take 1 tablet by mouth at bedtime as needed., Disp: , Rfl:   Allergies  Allergen Reactions  . Gabapentin Dermatitis  . Ibuprofen Nausea And Vomiting  . Lamisil [Terbinafine] Swelling  . Pregabalin Other (See Comments)  . Tetracycline Other (See Comments)    I personally reviewed active problem list, medication list, allergies, notes from last encounter, lab results with the patient/caregiver today.   ROS  Ten systems reviewed and is negative except as mentioned in HPI  Objective  Vitals:   01/03/19 1059  BP: 132/74  Pulse: 78  Resp: 16  Temp: 97.8 F (36.6 C)  TempSrc: Temporal  SpO2: 96%  Weight: 119 lb 6.4 oz (54.2 kg)  Height: 5\' 5"  (1.651 m)   Body mass index is 19.87 kg/m.  Physical Exam  Constitutional: Patient appears well-developed and well-nourished. No distress.  HENT: Head: Normocephalic and atraumatic.  Eyes: Conjunctivae and EOM are normal. No scleral icterus.  Pupils are equal, round, and reactive to light.  Neck: Normal range of motion. Neck supple. No JVD present. No thyromegaly present.  Cardiovascular: Normal rate, regular rhythm and normal heart sounds.  No murmur heard. No BLE edema. Pulmonary/Chest: Effort normal and breath sounds normal. No respiratory  distress. Musculoskeletal: Normal range of motion, no joint effusions. No gross deformities Neurological: Pt is alert and oriented to person, place, and time. No cranial nerve deficit. Coordination, balance, strength, speech and gait are normal.  Skin: Skin is warm and dry. No rash noted. No erythema.  Psychiatric: Patient has a normal mood and affect. behavior is normal. Judgment and thought content normal.  No results found for this or any previous visit (from the past 72 hour(s)).  PHQ2/9: Depression screen [facility] 2/9 01/03/2019 08/02/2018 03/25/2018 03/10/2017 04/08/2016  Decreased Interest 0 0 0 0 0  Down, Depressed, Hopeless 0 0 0 0 0  PHQ - 2 Score 0 0 0 0 0  Altered sleeping  0 0 0 - -  Tired, decreased energy 0 0 1 - -  Change in appetite 0 0 0 - -  Feeling bad or failure about yourself  0 0 0 - -  Trouble concentrating 0 0 0 - -  Moving slowly or fidgety/restless 0 0 0 - -  Suicidal thoughts 0 0 0 - -  PHQ-9 Score 0 0 1 - -  Difficult doing work/chores Not difficult at all Not difficult at all Not difficult at all - -   PHQ-2/9 Result is negative.    Fall Risk: Fall Risk  01/03/2019 08/02/2018 03/25/2018 03/10/2017 04/08/2016  Falls in the past year? 0 0 0 Yes No  Number falls in past yr: 0 0 0 1 -  Injury with Fall? 0 0 0 No -  Follow up Falls evaluation completed - - - -   Assessment & Plan  1. Essential hypertension - lisinopril (ZESTRIL) 40 MG tablet; Take 1 tablet (40 mg total) by mouth daily.  Dispense: 90 tablet; Refill: 0 - COMPLETE METABOLIC PANEL WITH GFR  2. Panic attacks - hydrOXYzine (VISTARIL) 25 MG capsule; Take 1-2 capsules (25-50 mg total) by mouth 2 (two) times daily as needed for anxiety.  Dispense: 30 capsule; Refill: 3 - busPIRone (BUSPAR) 7.5 MG tablet; Take 1 tablet once daily at night for 7 days, then increase to 1 tablet once daily.  Dispense: 180 tablet; Refill: 1  3. Chronic anxiety - citalopram (CELEXA) 40 MG tablet; Take 1 tablet (40 mg total) by  mouth daily.  Dispense: 90 tablet; Refill: 1 - busPIRone (BUSPAR) 7.5 MG tablet; Take 1 tablet once daily at night for 7 days, then increase to 1 tablet once daily.  Dispense: 180 tablet; Refill: 1 - Add buspar  4. COPD with asthma ([place]) - budesonide-formoterol (SYMBICORT) 160-4.5 MCG/ACT inhaler; Inhale 2 puffs into the lungs 2 (two) times daily.  Dispense: 3 Inhaler; Refill: 2 - albuterol (PROVENTIL HFA) 108 (90 Base) MCG/ACT inhaler; Inhale 2 puffs into the lungs every 4 (four) hours as needed for wheezing or shortness of breath.  Dispense: 8 g; Refill: 1  5. Current tobacco use - Not ready to quit  6. Lipid screening - Lipid panel  7. Irritable bowel syndrome with constipation - hydrocortisone (ANUSOL-HC) 25 MG suppository; Place 1 suppository (25 mg total) rectally 2 (two) times daily.  Dispense: 12 suppository; Refill: 1 - She does not want referral to GI today for IBS or colonoscopy as she does not have insurance.  Encouraged her to call back ASAP when she is willing to go for evaluation.  Anusol PRN for hemorrhoids.

## 2019-01-04 ENCOUNTER — Other Ambulatory Visit: Payer: Self-pay | Admitting: Family Medicine

## 2019-01-04 DIAGNOSIS — E7800 Pure hypercholesterolemia, unspecified: Secondary | ICD-10-CM

## 2019-01-04 MED ORDER — ROSUVASTATIN CALCIUM 5 MG PO TABS: 5.0000 mg | ORAL_TABLET | Freq: Every day | ORAL | 3 refills | Status: DC

## 2019-01-04 NOTE — Telephone Encounter (Signed)
Flexeril is not on her active medication list and we did not discuss yesterday.  I cannot refill at this time - she has not been prescribed this since 2017.

## 2019-01-04 NOTE — Telephone Encounter (Signed)
Buspar is pend to you, but she ask for refill on Flexeril which I did not see. She stated she forgot to mention to you on yesterday

## 2019-02-04 ENCOUNTER — Encounter: Payer: Self-pay | Admitting: Family Medicine

## 2019-02-04 ENCOUNTER — Ambulatory Visit: Payer: Self-pay

## 2019-02-04 ENCOUNTER — Ambulatory Visit (INDEPENDENT_AMBULATORY_CARE_PROVIDER_SITE_OTHER): Payer: Self-pay | Admitting: Family Medicine

## 2019-02-04 VITALS — Ht 65.0 in | Wt 119.0 lb

## 2019-02-04 DIAGNOSIS — U071 COVID-19: Secondary | ICD-10-CM

## 2019-02-04 DIAGNOSIS — R0602 Shortness of breath: Secondary | ICD-10-CM

## 2019-02-04 DIAGNOSIS — R509 Fever, unspecified: Secondary | ICD-10-CM

## 2019-02-04 DIAGNOSIS — R5383 Other fatigue: Secondary | ICD-10-CM

## 2019-02-04 DIAGNOSIS — J441 Chronic obstructive pulmonary disease with (acute) exacerbation: Secondary | ICD-10-CM

## 2019-02-04 DIAGNOSIS — J208 Acute bronchitis due to other specified organisms: Secondary | ICD-10-CM

## 2019-02-04 NOTE — Progress Notes (Signed)
Name: Leslie Evans   MRN: [id number]    DOB: 11/16/1955   Date:02/04/2019       Progress Note  Subjective:    Chief Complaint  Chief Complaint  Patient presents with  . Follow-up    tested positive on 1/11, still has cough and raspy voice. would like rx for cough  . Cough    using cough  . Weight Loss    has been sick, appetite loss, fx hx of DM, would like DM checked  . Medication Refill    chronic back pain, refill on flexerill    I connected with  Leslie Evans  on [id number] at 10:20 AM EST by a video enabled telemedicine application and verified that I am speaking with the correct person using two identifiers.  I discussed the limitations of evaluation and management by telemedicine and the availability of in person appointments. The patient expressed understanding and agreed to proceed. Staff also discussed with the patient that there may be a patient responsible charge related to this service. Patient Location: home Provider Location: [facility] Additional Individuals present: none  HPI  Pt is new to me, presents with multiple acute complaints, + COVID and has some questions about chronic conditions and med refills handled by her PCP who just saw her a month ago.  She states she does routine testing for her job and was positive on 01/17/2019, asx at test time.  On 01/19/2019 about she developed fever but a day or 2 later when she did not have fever for 24 hours she returned to work.  She is continued to [employer] work for fever every couple days and then returned to work for a few days also while having gradually worsening respiratory symptoms.  She complains of chest tightness, SOB, wheeze, productive cough, exertional dyspnea and fatigue.   She has been using her albuterol inhaler but it does not give her complete relief, she is also on Symbicort for COPD.  Today she does not have fever but she is very fatigued.  She denies any sweats chills headaches.  We discussed going to the  respiratory clinic in [place] to be evaluated in person versus getting a chest x-ray at one of the hospital radiology departments.  She agreed to go to the respiratory clinic if we can get her an appointment.  Patient first was hesitant to go because she stated she wanted to lie down and sleep in her home because she was so tired.  I explained that she still could rest and the respiratory clinic is open in the evenings and we would call her with directions and time of appointment.   She coughed throughout the encounter today sometimes very hard for her to stop a coughing fit or speak through a complete sentence.  She did state that she has a pulse ox at home and during encounter pt checked SpO2 of 95% with HR 75.  With walker in her home or doing physical work at work or at home is when she is very short of breath and very fatigued.  She did not do any ambulation or physical work on the phone to see if pulse ox changed.  Patient works in a nursing home.  She states that she has to go to work as long as she is afebrile unless she has a doctor's note, I have provided a work note for her until her respiratory symptoms improve and discussed thoroughly today and very clearly that positive Covid test with  respiratory symptoms and fever needs complete isolation for continue with 10 to 21 days depending on severity of symptoms.  I wrote a note to this effect.    Patient Active Problem List   Diagnosis Date Noted  . Essential hypertension 11/27/2014  . IBS (irritable bowel syndrome) 08/21/2014  . Lumbar disc disease with radiculopathy 08/21/2014  . Chronic anxiety 08/21/2014  . COPD with asthma ([place]) 07/21/2013  . Current tobacco use 07/21/2013    Social History   Tobacco Use  . Smoking status: Current Every Day Smoker    Packs/day: 0.75    Types: Cigarettes  . Smokeless tobacco: Never Used  Substance Use Topics  . Alcohol use: Yes    Alcohol/week: 0.0 standard drinks     Current Outpatient  Medications:  .  acetaminophen (TYLENOL) 500 MG tablet, Take 500 mg by mouth every 6 (six) hours as needed., Disp: , Rfl:  .  albuterol (PROVENTIL HFA) 108 (90 Base) MCG/ACT inhaler, Inhale 2 puffs into the lungs every 4 (four) hours as needed for wheezing or shortness of breath., Disp: 8 g, Rfl: 1 .  budesonide-formoterol (SYMBICORT) 160-4.5 MCG/ACT inhaler, Inhale 2 puffs into the lungs 2 (two) times daily., Disp: 3 Inhaler, Rfl: 2 .  citalopram (CELEXA) 40 MG tablet, Take 1 tablet (40 mg total) by mouth daily., Disp: 90 tablet, Rfl: 1 .  hydrocortisone (ANUSOL-HC) 25 MG suppository, Place 1 suppository (25 mg total) rectally 2 (two) times daily., Disp: 12 suppository, Rfl: 1 .  hydrOXYzine (VISTARIL) 25 MG capsule, Take 1-2 capsules (25-50 mg total) by mouth 2 (two) times daily as needed for anxiety., Disp: 30 capsule, Rfl: 3 .  lisinopril (ZESTRIL) 40 MG tablet, Take 1 tablet (40 mg total) by mouth daily., Disp: 90 tablet, Rfl: 0 .  busPIRone (BUSPAR) 7.5 MG tablet, Take 1 tablet once daily at night for 7 days, then increase to 1 tablet once daily. (Patient not taking: Reported on 02/04/2019), Disp: 180 tablet, Rfl: 1 .  rosuvastatin (CRESTOR) 5 MG tablet, Take 1 tablet (5 mg total) by mouth daily. (Patient not taking: Reported on 02/04/2019), Disp: 90 tablet, Rfl: 3  Allergies  Allergen Reactions  . Gabapentin Dermatitis  . Ibuprofen Nausea And Vomiting  . Lamisil [Terbinafine] Swelling  . Pregabalin Other (See Comments)  . Tetracycline Other (See Comments)    I personally reviewed active problem list, medication list, allergies, family history, social history, health maintenance, notes from last encounter, lab results, imaging with the patient/caregiver today.   Review of Systems  Constitutional: Negative.   HENT: Negative.   Eyes: Negative.   Respiratory: Negative.   Cardiovascular: Negative.   Gastrointestinal: Negative.   Endocrine: Negative.   Genitourinary: Negative.     Musculoskeletal: Negative.   Skin: Negative.   Allergic/Immunologic: Negative.   Neurological: Negative.   Hematological: Negative.   Psychiatric/Behavioral: Negative.   All other systems reviewed and are negative.     Objective:   Virtual encounter, vitals limited, only able to obtain the following Today's Vitals   02/04/19 0938  Weight: 119 lb (54 kg)  Height: 5\' 5"  (1.651 m)   Body mass index is 19.8 kg/m. Nursing Note and Vital Signs reviewed.  Physical Exam Vitals and nursing note reviewed.  Constitutional:      General: She is not in acute distress.    Appearance: Normal appearance. She is well-developed. She is not ill-appearing, toxic-appearing or diaphoretic.  HENT:     Head: Normocephalic and atraumatic.  Eyes:  General:        Right eye: No discharge.        Left eye: No discharge.     Conjunctiva/sclera: Conjunctivae normal.  Neck:     Trachea: No tracheal deviation.  Cardiovascular:     Rate and Rhythm: Normal rate.  Pulmonary:     Breath sounds: No stridor. Wheezing present.     Comments: Frequent coughing, sometimes difficulty with speaking a full sentence, slight tachypnea, no visible retractions or accessory muscle use Skin:    Coloration: Skin is not jaundiced or pale.     Findings: No rash.  Neurological:     Mental Status: She is alert.  Psychiatric:        Mood and Affect: Mood normal.        Behavior: Behavior normal.     PE limited by telephone encounter  No results found for this or any previous visit (from the past 72 hour(s)).  Assessment and Plan:     ICD-10-CM   1. Fever due to COVID-19  U07.1    R50.9    pt instructed to isolate until she meets CDC requirements to RTW - note given  2. Acute bronchitis due to COVID-19 virus  U07.1    J20.8   3. COPD with acute exacerbation (HCC)  J44.1   4. SOBOE (shortness of breath on exertion)  R06.02    sx gradually worsening over the past 2 weeks, needs in person assessment - set  up appt at respiratory clinic this evening.  5. Fatigue, unspecified type  R53.83     Concerned about the patient's frequent coughing, shortness of breath and worsening symptoms with history of COPD - Patient had several coughing fits while on the encounter today complains of worsening respiratory symptoms using her inhaler more, fatigue, cough that is productive, shortness of breath, wheeze, chest tightness much worse with any exertion.  Thankfully she does have a pulse ox at home and sitting during the visit at rest she was saturating 95%.  Would like to arrange for her to get in person assessment at the respiratory clinic if able to do that tonight, if we cannot arrange will need to get her a chest x-ray done at one of the hospitals.  She has been going to work on and off for the last 2 weeks knowing she had positive Covid test and symptoms and she continued to return to work on days that she was not febrile despite her other symptoms.  She was told multiple times very clearly that she needs to isolate at home and she is not allowed to go to work until she is evaluated and has improving respiratory symptoms.  With history of COPD concern for more moderate case of Covid would like to rule out multifocal pneumonia.  She had multiple questions about her other old diagnoses, concerns for screening test with family history of diabetes wanted med refills and explained to her that I cannot address all of that today encouraged her to hold on starting some of her new medicines from her PCP just for a week or 2 until she is improved specifically asked her to hold the new Crestor medication.  She can try BuSpar it still may be helpful for her anxiety.  She wants to change pharmacies wants to going to [place] to get her medications and I continue to reiterate to her that she cannot go anywhere except to get her medical care and she needs to stay at home and  isolate.  Thorough education done on COVID sx,  isolation, end of isolation criteria, concerning signs and sx.  -Red flags and when to present for emergency care or RTC including fever > chest pain, worsening shortness of breath not relieved with inhaler or home meds, new/worsening/un-resolving symptoms, if SpO2 drops to 90 or below, reviewed with patient at time of visit. Follow up and care instructions discussed and provided in AVS. - I discussed the assessment and treatment plan with the patient. The patient was provided an opportunity to ask questions and all were answered. The patient agreed with the plan and demonstrated an understanding of the instructions.  I provided 18 minutes of non-face-to-face time during this encounter. Significant other minute time was spent reviewing chart, preparing work notes with the patient, arranging appointments and follow-up care for the patient  Delsa Grana, PA-C 02/04/19 10:38 AM

## 2019-02-04 NOTE — Patient Instructions (Signed)
Stay home and strickly isolate until you are cleared by [place] to return to work.   You will need a follow up visit for that. You may leave home to go to respiratory clinic and otherwise seek medical evaluation.  We will call you back with appointment for respiratory clinic which I hope to get you into this evening.  If you are cleared to go back home you need to stay home push fluids you can use Tylenol for fever body aches, supplement with vitamin D, can use your normal COPD and cough medications -after your assessment other medicines may be prescribed for you -you need to monitor your pulse ox make sure that you do not drop to 90% if you do drop below 90 you need to seek evaluation again.  Work note will be provided if you will have someone come by and pick it up.

## 2019-02-07 ENCOUNTER — Ambulatory Visit (INDEPENDENT_AMBULATORY_CARE_PROVIDER_SITE_OTHER): Payer: Self-pay | Admitting: Internal Medicine

## 2019-02-07 ENCOUNTER — Ambulatory Visit
Admission: RE | Admit: 2019-02-07 | Discharge: 2019-02-07 | Disposition: A | Discharge status: Home / Self Care | Payer: Self-pay | Source: Ambulatory Visit | Attending: Internal Medicine | Admitting: Internal Medicine

## 2019-02-07 ENCOUNTER — Encounter: Payer: Self-pay | Admitting: Internal Medicine

## 2019-02-07 ENCOUNTER — Ambulatory Visit
Admission: RE | Admit: 2019-02-07 | Discharge: 2019-02-07 | Disposition: A | Discharge status: Home / Self Care | Payer: Self-pay | Source: Ambulatory Visit | Attending: Family Medicine | Admitting: Family Medicine

## 2019-02-07 ENCOUNTER — Telehealth: Payer: Self-pay | Admitting: Family Medicine

## 2019-02-07 ENCOUNTER — Other Ambulatory Visit: Payer: Self-pay

## 2019-02-07 VITALS — BP 104/76 | HR 97 | Temp 99.4°F | Wt 116.5 lb

## 2019-02-07 DIAGNOSIS — R05 Cough: Secondary | ICD-10-CM

## 2019-02-07 DIAGNOSIS — J40 Bronchitis, not specified as acute or chronic: Secondary | ICD-10-CM

## 2019-02-07 DIAGNOSIS — R059 Cough, unspecified: Secondary | ICD-10-CM

## 2019-02-07 DIAGNOSIS — E860 Dehydration: Secondary | ICD-10-CM

## 2019-02-07 DIAGNOSIS — R42 Dizziness and giddiness: Secondary | ICD-10-CM

## 2019-02-07 DIAGNOSIS — R062 Wheezing: Secondary | ICD-10-CM

## 2019-02-07 DIAGNOSIS — Z8616 Personal history of COVID-19: Secondary | ICD-10-CM

## 2019-02-07 MED ORDER — PREDNISONE 10 MG (21) PO TBPK: ORAL_TABLET | ORAL | 0 refills | Status: DC

## 2019-02-07 MED ORDER — HYDROCODONE-HOMATROPINE 5-1.5 MG/5ML PO SYRP: 5.0000 mL | ORAL_SOLUTION | Freq: Three times a day (TID) | ORAL | 0 refills | Status: DC | PRN

## 2019-02-07 MED ORDER — ALBUTEROL SULFATE HFA 108 (90 BASE) MCG/ACT IN AERS
2.0000 | INHALATION_SPRAY | Freq: Once | RESPIRATORY_TRACT | Status: AC
  Administered 2019-02-07: 2 via RESPIRATORY_TRACT

## 2019-02-07 MED ORDER — AMOXICILLIN-POT CLAVULANATE 875-125 MG PO TABS: 1.0000 | ORAL_TABLET | Freq: Two times a day (BID) | ORAL | 0 refills | Status: DC

## 2019-02-07 NOTE — Patient Instructions (Addendum)
Take the following supplements to help your immune system be stronger to fight this viral infection Take Quarcetin 500 mg three times a day x 7 days with Zinc 50 mg ones a day x 7 days. The quarcetin is an antiviral and anti-inflammatory supplement which helps open the zinc channels in the cell to absorb Zinc. Zinc helps decrease the virus load in your body.  Also make sure to take Vit D 5,000 IU per day with a fatty meal and Vit C 1000 mg a day until you are completely better. Stay on Vitamin D 2,000 the rest of the season. I dont see you have had any Vit D levels done in the labs I checked in Epic. Have those checked the next time you see your family Dr.    Liliane Shi this video who to do saline nose rinses.  [email or web address]   Do not use tap water, only boiled water that has been cooled down.  Do it twice a day  for 5-7 days, but avoid bed time.    You are dehydrated and I advise you to push 70-80 oz of fluids a day for the next 48h.,

## 2019-02-07 NOTE — Telephone Encounter (Signed)
Patient stated she did not go, had spoken to [facility] and was told they could not do a chest xray on her. Patient said they was sending her to [facility] [place] instead of [facility]. Still coughing but she stayed in all weekend. Please advise

## 2019-02-07 NOTE — Telephone Encounter (Signed)
appt scheduled for 2.1.2021 at 5:45pm, pt informed and was given the address

## 2019-02-07 NOTE — Telephone Encounter (Signed)
Understood - please make sure she has an appointment at [facility] location for this evening if at all possible.

## 2019-02-07 NOTE — Telephone Encounter (Addendum)
Leslie Evans - I received a message that the patient was referred to the respiratory clinic but then no-showed.  Please call to check on her. Thanks!  ----- Message from Nickola Major sent at 02/04/2019  6:26 PM EST ----- Regarding: Respiratory Clinic Patient no showed 02/04/19 respiratory clinic appointment.

## 2019-02-07 NOTE — Telephone Encounter (Signed)
She do not want [facility], will try [facility]

## 2019-02-07 NOTE — Telephone Encounter (Signed)
Please schedule patient for tonight in [place] for [facility]

## 2019-02-07 NOTE — Progress Notes (Signed)
This visit occurred during the SARS-CoV-2 public health emergency.  Safety protocols were in place, including screening questions prior to the visit, additional usage of staff PPE, and extensive cleaning of exam room while observing appropriate contact time as indicated for disinfecting solutions.  Subjective:     Patient ID: Leslie Evans , female    DOB: [birth year] , 64 y.o.   MRN: [id number]   Chief Complaint  Patient presents with  . Cough    positive for covid on 01/11--wednesday and thursday with fever---1-18 was the last time she had a fever.  chest congestion with pale yellow mucus with foul taste.   . Fatigue    with dizziness when she is up moving around.     HPI Has been having cough attacks, wheezing and PND since 1/11, and the body aches comes and goes and has not had a fever since 1/18. Was Covid +. Her cough is productive with yellow mucous with bad taste, but denies hemoptysis. Has been using her Albuterol inhaler only once. The cough provokes her head pain. 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 Has not had any GI symptoms. Does have a lot of PND.  Has been taking Vit C, and multivitamins, and Vit D. She also admits she is a smoker.    Past Medical History:  Diagnosis Date  . Anxiety   . Asthma   . Cervicalgia   . COVID-19   . Hyperlipidemia      Family History  Problem Relation Age of Onset  . Cancer Mother   . Diabetes Mother   . Heart disease Mother   . Cancer Father   . Drug abuse Brother   . Diabetes Maternal Grandmother   . COPD Maternal Grandfather   . Cancer Brother      Current Outpatient Medications:  .  acetaminophen (TYLENOL) 500 MG tablet, Take 500 mg by mouth every 6 (six) hours as needed., Disp: , Rfl:  .  albuterol (PROVENTIL HFA) 108 (90 Base) MCG/ACT inhaler, Inhale 2 puffs into the lungs every 4 (four) hours as needed for wheezing or shortness of breath., Disp: 8 g, Rfl: 1 .   budesonide-formoterol (SYMBICORT) 160-4.5 MCG/ACT inhaler, Inhale 2 puffs into the lungs 2 (two) times daily., Disp: 3 Inhaler, Rfl: 2 .  citalopram (CELEXA) 40 MG tablet, Take 1 tablet (40 mg total) by mouth daily., Disp: 90 tablet, Rfl: 1 .  hydrocortisone (ANUSOL-HC) 25 MG suppository, Place 1 suppository (25 mg total) rectally 2 (two) times daily., Disp: 12 suppository, Rfl: 1 .  hydrOXYzine (VISTARIL) 25 MG capsule, Take 1-2 capsules (25-50 mg total) by mouth 2 (two) times daily as needed for anxiety., Disp: 30 capsule, Rfl: 3 .  lisinopril (ZESTRIL) 40 MG tablet, Take 1 tablet (40 mg total) by mouth daily., Disp: 90 tablet, Rfl: 0 .  rosuvastatin (CRESTOR) 5 MG tablet, Take 1 tablet (5 mg total) by mouth daily., Disp: 90 tablet, Rfl: 3 .  busPIRone (BUSPAR) 7.5 MG tablet, Take 1 tablet once daily at night for 7 days, then increase to 1 tablet once daily. (Patient not taking: Reported on 02/04/2019), Disp: 180 tablet, Rfl: 1   Allergies  Allergen Reactions  . Gabapentin Dermatitis  . Ibuprofen Nausea And Vomiting  . Lamisil [Terbinafine] Swelling  . Pregabalin Other (See Comments)  . Tetracycline Other (See Comments)     Review of Systems  + for cough attacks, wheezing, smoker,  R ear pain, fatigue, dizziness with change is position. Neg for the rest of 10 point ROS Today's Vitals   02/07/19 1759 02/07/19 1807 02/07/19 1809  BP: (!) 131/98 110/78 104/76  Pulse: 89  97  Temp: 99.4 F (37.4 C)    TempSrc: Oral    SpO2: 98%    Weight: 116 lb 8 oz (52.8 kg)     Body mass index is 19.39 kg/m.   Objective:  Physical Exam   Constitutional: She is oriented to person, place, and time. She appears well-developed and well-nourished. No distress.  HENT: TM's- gray and shiny. Both ear canals healthy and not tender with external motion.  Head: Normocephalic and atraumatic.  Right Ear: External ear normal.  Left Ear: External ear normal.  Nose: Nose normal.  Eyes: Conjunctivae are  normal. Right eye exhibits no discharge. Left eye exhibits no discharge. No scleral icterus.  Neck: Neck supple. No thyromegaly present.  No carotid bruits bilaterally  Cardiovascular: Normal rate and regular rhythm.  No murmur heard. Pulmonary/Chest: deep breaths provoked cough attacks. Has scattered wheezing but worse in R lung.  Musculoskeletal: Normal range of motion. She exhibits no edema.  Lymphadenopathy:    She has no cervical adenopathy.  Neurological: She is alert and oriented to person, place, and time.  Skin: Skin is warm and dry. Capillary refill takes less than 2 seconds. No rash noted. She is not diaphoretic.  Psychiatric: She has a normal mood and affect. Her behavior is normal. Judgment and thought content normal.  Nursing note reviewed. Chest xray negative today.  Assessment And Plan:  1- Broncthitis- Placed on Tussionex and Augmentin as noted in rx 2- Hx of Covid- see instructions about care. We will have Covid monitoring via phone as noted.   Leslie Vanderbeck RODRIGUEZ-SOUTHWORTH, PA-C    THE PATIENT IS ENCOURAGED TO PRACTICE SOCIAL DISTANCING DUE TO THE COVID-19 PANDEMIC.

## 2019-05-24 ENCOUNTER — Telehealth: Payer: Self-pay | Admitting: Emergency Medicine

## 2019-05-24 DIAGNOSIS — I10 Essential (primary) hypertension: Secondary | ICD-10-CM

## 2019-05-24 MED ORDER — LISINOPRIL 40 MG PO TABS: 40.0000 mg | ORAL_TABLET | Freq: Every day | ORAL | 0 refills | Status: DC

## 2019-05-27 ENCOUNTER — Ambulatory Visit: Payer: Self-pay | Admitting: Family Medicine

## 2019-05-27 NOTE — Telephone Encounter (Signed)
lvm for scheduling °

## 2019-06-28 ENCOUNTER — Encounter: Payer: Self-pay | Admitting: Family Medicine

## 2019-06-28 ENCOUNTER — Ambulatory Visit (INDEPENDENT_AMBULATORY_CARE_PROVIDER_SITE_OTHER): Payer: Self-pay | Admitting: Family Medicine

## 2019-06-28 ENCOUNTER — Other Ambulatory Visit: Payer: Self-pay

## 2019-06-28 VITALS — BP 110/68 | HR 76 | Temp 98.3°F | Resp 14 | Ht 65.0 in | Wt 115.5 lb

## 2019-06-28 DIAGNOSIS — E7800 Pure hypercholesterolemia, unspecified: Secondary | ICD-10-CM

## 2019-06-28 DIAGNOSIS — F410 Panic disorder [episodic paroxysmal anxiety] without agoraphobia: Secondary | ICD-10-CM

## 2019-06-28 DIAGNOSIS — F419 Anxiety disorder, unspecified: Secondary | ICD-10-CM

## 2019-06-28 DIAGNOSIS — Z5181 Encounter for therapeutic drug level monitoring: Secondary | ICD-10-CM

## 2019-06-28 DIAGNOSIS — I10 Essential (primary) hypertension: Secondary | ICD-10-CM

## 2019-06-28 DIAGNOSIS — J449 Chronic obstructive pulmonary disease, unspecified: Secondary | ICD-10-CM

## 2019-06-28 DIAGNOSIS — J4489 Other specified chronic obstructive pulmonary disease: Secondary | ICD-10-CM

## 2019-06-28 MED ORDER — HYDROXYZINE PAMOATE 25 MG PO CAPS: 25.0000 mg | ORAL_CAPSULE | Freq: Two times a day (BID) | ORAL | 5 refills | Status: DC | PRN

## 2019-06-28 MED ORDER — LISINOPRIL 40 MG PO TABS: 40.0000 mg | ORAL_TABLET | Freq: Every day | ORAL | 3 refills | Status: DC

## 2019-06-28 MED ORDER — BUDESONIDE-FORMOTEROL FUMARATE 160-4.5 MCG/ACT IN AERO: 2.0000 | INHALATION_SPRAY | Freq: Two times a day (BID) | RESPIRATORY_TRACT | 11 refills | Status: DC

## 2019-06-28 MED ORDER — ROSUVASTATIN CALCIUM 5 MG PO TABS: 5.0000 mg | ORAL_TABLET | Freq: Every day | ORAL | 3 refills | Status: DC

## 2019-06-28 MED ORDER — ALBUTEROL SULFATE HFA 108 (90 BASE) MCG/ACT IN AERS: 2.0000 | INHALATION_SPRAY | RESPIRATORY_TRACT | 3 refills | Status: DC | PRN

## 2019-06-28 NOTE — Patient Instructions (Signed)
Recommendations on cholesterol and starting statins.    There is a benefit from LDL-C (bad cholesterol) lowering with statin therapy at virtually all levels of cardiovascular risk.  If statin therapy had no side effects and caused no financial burden, it might be reasonable to recommend it to virtually all at-risk individuals, similar to a healthy diet and exercise  It is this good of a medication!!  It reduces risk in almost everyone.   There are possible side effects with ALL medications and with statins there is a small subset of the population who may not metabolize it well, which causing muscle aches as a side effect (~5%).  We monitor for this, can test for this, and usually are careful to work with you to get a medication that gives you the benefits with minimal side effects.  Some people are sensitive to medications in general and we try to use the highest dose tolerated to give the most benefit.   [employer] [facility] cholesterol and statin guidelines are as follows: In adults 35 to 64 years of age without diabetes mellitus and with LDL-C levels ?81, at a 10-year atherosclerotic cardiovascular disease risk of ?7.5 percent, start a moderate-intensity statin if a discussion of treatment options favors statin therapy  If LDL is >160, statins are indicated.  Patients with other significant risk factors would also benefit from statin.  Some of these factors include a family history of premature cardiovascular disease, chronic kidney disease, or chronic inflammatory disorder (such as chronic human immunodeficiency viral infection).   Can get more information at the following website:  [email or web address].co.za    You can try the statin over the next couple months - if you want to do every other day dosing   We need to recheck you in 3 to 4 months after starting the meds

## 2019-06-28 NOTE — Progress Notes (Signed)
Name: Leslie Evans   MRN: [id number]    DOB: 07-30-1955   Date:06/28/2019       Progress Note  Chief Complaint  Patient presents with  . Follow-up  . Hypertension  . Hyperlipidemia    Patient states can not afford  . Anxiety  . Medication Refill     Subjective:   Leslie Evans is a 64 y.o. female, presents to clinic for routine f/up  She has had COVID Feb to March, has not gotten vaccine and does not want to get vaccine and her work is going to mandate it and she will not be scheduled to work.  She requests work note  Weight loss, down 17 lbs over the past year Wt Readings from Last 5 Encounters:  06/28/19 115 lb 8 oz (52.4 kg)  02/07/19 116 lb 8 oz (52.8 kg)  02/04/19 119 lb (54 kg)  01/03/19 119 lb 6.4 oz (54.2 kg)  08/02/18 132 lb 1.6 oz (59.9 kg)   BMI Readings from Last 5 Encounters:  06/28/19 19.22 kg/m  02/07/19 19.39 kg/m  02/04/19 19.80 kg/m  01/03/19 19.87 kg/m  08/02/18 21.98 kg/m   Hypertension:  Currently managed on lisinopril 10 mg - she will hold if she has IBS/diarrhea bouts  Pt reports good med compliance and denies any SE.  No lightheadedness, hypotension, syncope. Blood pressure today is well controlled. BP Readings from Last 3 Encounters:  06/28/19 110/68  02/07/19 104/76  01/03/19 132/74  Pt denies CP, SOB, exertional sx, LE edema, palpitation, Ha's, visual disturbances  Hyperlipidemia: Currently treated with crestor 5 mg, reports good compliance and denies SE Last Lipids: Lab Results  Component Value Date   CHOL 172 01/03/2019   HDL 52 01/03/2019   LDLCALC 100 (H) 01/03/2019   TRIG 100 01/03/2019   CHOLHDL 3.3 01/03/2019   - Denies: Chest pain, shortness of breath, myalgias, claudication  The 10-year ASCVD risk score Mikey Bussing DC Jr., et al., 2013) is: 8.1%   Values used to calculate the score:     Age: 56 years     Sex: Female     Is Non-Hispanic African American: No     Diabetic: No     Tobacco smoker: Yes     Systolic  Blood Pressure: 110 mmHg     Is BP treated: Yes     HDL Cholesterol: 52 mg/dL     Total Cholesterol: 172 mg/dL  On celexa for anxiety and moods phq reviewed and negative, pt denies med SE or concerns Hydroxyzine prn for increased anxiety/panic attaccks Depression screen [facility] 2/9 06/28/2019 02/04/2019 01/03/2019  Decreased Interest 0 0 0  Down, Depressed, Hopeless 0 1 0  PHQ - 2 Score 0 1 0  Altered sleeping 0 1 0  Tired, decreased energy 0 1 0  Change in appetite 0 1 0  Feeling bad or failure about yourself  0 0 0  Trouble concentrating 0 0 0  Moving slowly or fidgety/restless 0 0 0  Suicidal thoughts 0 0 0  PHQ-9 Score 0 4 0  Difficult doing work/chores Not difficult at all Somewhat difficult Not difficult at all    Current smoker, COPD- managed with symbicort and albuterol rescue inhaler She denies any change in daily sx or SOB with exertion  Current Outpatient Medications:  .  acetaminophen (TYLENOL) 500 MG tablet, Take 500 mg by mouth every 6 (six) hours as needed., Disp: , Rfl:  .  albuterol (PROVENTIL HFA) 108 (90 Base) MCG/ACT inhaler,  Inhale 2 puffs into the lungs every 4 (four) hours as needed for wheezing or shortness of breath., Disp: 8 g, Rfl: 1 .  budesonide-formoterol (SYMBICORT) 160-4.5 MCG/ACT inhaler, Inhale 2 puffs into the lungs 2 (two) times daily., Disp: 3 Inhaler, Rfl: 2 .  citalopram (CELEXA) 40 MG tablet, Take 1 tablet (40 mg total) by mouth daily., Disp: 90 tablet, Rfl: 1 .  hydrocortisone (ANUSOL-HC) 25 MG suppository, Place 1 suppository (25 mg total) rectally 2 (two) times daily., Disp: 12 suppository, Rfl: 1 .  hydrOXYzine (VISTARIL) 25 MG capsule, Take 1-2 capsules (25-50 mg total) by mouth 2 (two) times daily as needed for anxiety., Disp: 30 capsule, Rfl: 3 .  lisinopril (ZESTRIL) 40 MG tablet, Take 1 tablet (40 mg total) by mouth daily., Disp: 30 tablet, Rfl: 0 .  rosuvastatin (CRESTOR) 5 MG tablet, Take 1 tablet (5 mg total) by mouth daily. (Patient not  taking: Reported on 06/28/2019), Disp: 90 tablet, Rfl: 3  Patient Active Problem List   Diagnosis Date Noted  . Essential hypertension 11/27/2014  . IBS (irritable bowel syndrome) 08/21/2014  . Lumbar disc disease with radiculopathy 08/21/2014  . Chronic anxiety 08/21/2014  . COPD with asthma ([place]) 07/21/2013  . Current tobacco use 07/21/2013    History reviewed. No pertinent surgical history.  Family History  Problem Relation Age of Onset  . Cancer Mother   . Diabetes Mother   . Heart disease Mother   . Cancer Father   . Drug abuse Brother   . Diabetes Maternal Grandmother   . COPD Maternal Grandfather   . Cancer Brother     Social History   Tobacco Use  . Smoking status: Current Every Day Smoker    Packs/day: 0.50    Types: Cigarettes  . Smokeless tobacco: Never Used  Vaping Use  . Vaping Use: Some days  . Substances: CBD  Substance Use Topics  . Alcohol use: Yes    Alcohol/week: 0.0 standard drinks  . Drug use: No     Allergies  Allergen Reactions  . Gabapentin Dermatitis  . Ibuprofen Nausea And Vomiting  . Lamisil [Terbinafine] Swelling  . Pregabalin Other (See Comments)  . Tetracycline Other (See Comments)    Health Maintenance  Topic Date Due  . COVID-19 Vaccine (1) Never done  . PAP SMEAR-Modifier  Never done  . MAMMOGRAM  02/04/2020 (Originally 09/07/2014)  . INFLUENZA VACCINE  08/07/2019  . COLONOSCOPY  06/11/2020  . TETANUS/TDAP  08/10/2022  . Hepatitis C Screening  Completed  . HIV Screening  Completed    Chart Review Today: I personally reviewed active problem list, medication list, allergies, family history, social history, health maintenance, notes from last encounter, lab results, imaging with the patient/caregiver today.   Review of Systems  10 Systems reviewed and are negative for acute change except as noted in the HPI.    Objective:   Vitals:   06/28/19 1403  BP: 110/68  Pulse: 76  Resp: 14  Temp: 98.3 F (36.8 C)  SpO2:  96%  Weight: 115 lb 8 oz (52.4 kg)  Height: 5\' 5"  (1.651 m)    Body mass index is 19.22 kg/m.  Physical Exam Vitals and nursing note reviewed.  Constitutional:      General: She is not in acute distress.    Appearance: Normal appearance. She is well-developed. She is not ill-appearing, toxic-appearing or diaphoretic.     Interventions: Face mask in place.     Comments: Thin well appearing  HENT:  Head: Normocephalic and atraumatic.     Right Ear: External ear normal.     Left Ear: External ear normal.  Eyes:     General: Lids are normal. No scleral icterus.       Right eye: No discharge.        Left eye: No discharge.     Conjunctiva/sclera: Conjunctivae normal.  Neck:     Trachea: Phonation normal. No tracheal deviation.  Cardiovascular:     Rate and Rhythm: Normal rate and regular rhythm.     Pulses: Normal pulses.          Radial pulses are 2+ on the right side and 2+ on the left side.       Posterior tibial pulses are 2+ on the right side and 2+ on the left side.     Heart sounds: Normal heart sounds. No murmur heard.  No friction rub. No gallop.   Pulmonary:     Effort: Pulmonary effort is normal. No respiratory distress.     Breath sounds: Normal breath sounds. No stridor. No wheezing, rhonchi or rales.  Chest:     Chest wall: No tenderness.  Abdominal:     General: Bowel sounds are normal. There is no distension.     Palpations: Abdomen is soft.     Tenderness: There is no abdominal tenderness. There is no guarding or rebound.  Musculoskeletal:     Cervical back: Normal range of motion and neck supple.     Right lower leg: No edema.     Left lower leg: No edema.  Lymphadenopathy:     Cervical: No cervical adenopathy.  Skin:    General: Skin is warm and dry.     Coloration: Skin is not jaundiced or pale.     Findings: No rash.  Neurological:     Mental Status: She is alert. Mental status is at baseline.     Motor: No abnormal muscle tone.     Gait: Gait  normal.  Psychiatric:        Speech: Speech normal.        Behavior: Behavior normal.         Assessment & Plan:   1. Essential hypertension Well controlled, stable - COMPLETE METABOLIC PANEL WITH GFR - lisinopril (ZESTRIL) 40 MG tablet; Take 1 tablet (40 mg total) by mouth daily.  Dispense: 90 tablet; Refill: 3  2. Chronic anxiety Well controlled and stable with celexa and hydroxyzine prn - hydrOXYzine (VISTARIL) 25 MG capsule; Take 1-2 capsules (25-50 mg total) by mouth 2 (two) times daily as needed for anxiety (for anxiety attacks, and for insomnia).  Dispense: 60 capsule; Refill: 5  3. Pure hypercholesterolemia Hx of elevated LDL, recheck today, discussed with pt ASCVD risk and statin indications - COMPLETE METABOLIC PANEL WITH GFR - Lipid panel - rosuvastatin (CRESTOR) 5 MG tablet; Take 1 tablet (5 mg total) by mouth daily.  Dispense: 90 tablet; Refill: 3  4. Medication monitoring encounter - COMPLETE METABOLIC PANEL WITH GFR - Lipid panel  5. COPD with asthma ([place]) Stable sx with symbicort and rescue inhaler prn rarely using rescue inhaler - albuterol (PROVENTIL HFA) 108 (90 Base) MCG/ACT inhaler; Inhale 2 puffs into the lungs every 4 (four) hours as needed for wheezing or shortness of breath.  Dispense: 6.7 g; Refill: 3 - budesonide-formoterol (SYMBICORT) 160-4.5 MCG/ACT inhaler; Inhale 2 puffs into the lungs 2 (two) times daily.  Dispense: 1 Inhaler; Refill: 11  6. Panic attacks Well controlled -  hydrOXYzine (VISTARIL) 25 MG capsule; Take 1-2 capsules (25-50 mg total) by mouth 2 (two) times daily as needed for anxiety (for anxiety attacks, and for insomnia).  Dispense: 60 capsule; Refill: 5  3-4 month f/up to recheck statin med tolerance and lipid panel  Delsa Grana, PA-C 06/28/19 2:28 PM

## 2019-06-29 LAB — COMPLETE METABOLIC PANEL WITH GFR
AG Ratio: 2.2 (calc) (ref 1.0–2.5)
ALT: 11 U/L (ref 6–29)
AST: 12 U/L (ref 10–35)
Albumin: 4.3 g/dL (ref 3.6–5.1)
Alkaline phosphatase (APISO): 75 U/L (ref 37–153)
BUN: 7 mg/dL (ref 7–25)
CO2: 28 mmol/L (ref 20–32)
Calcium: 9.7 mg/dL (ref 8.6–10.4)
Chloride: 109 mmol/L (ref 98–110)
Creat: 0.62 mg/dL (ref 0.50–0.99)
GFR, Est African American: 111 mL/min/{1.73_m2} (ref >=60)
GFR, Est Non African American: 96 mL/min/{1.73_m2} (ref >=60)
Globulin: 2 g/dL (calc) (ref 1.9–3.7)
Glucose, Bld: 93 mg/dL (ref 65–99)
Potassium: 3.3 mmol/L — ABNORMAL LOW (ref 3.5–5.3)
Sodium: 142 mmol/L (ref 135–146)
Total Bilirubin: 0.5 mg/dL (ref 0.2–1.2)
Total Protein: 6.3 g/dL (ref 6.1–8.1)

## 2019-06-29 LAB — LIPID PANEL
Cholesterol: 162 mg/dL (ref <200)
HDL: 56 mg/dL (ref >=50)
LDL Cholesterol (Calc): 89 mg/dL (calc)
Non-HDL Cholesterol (Calc): 106 mg/dL (calc) (ref <130)
Total CHOL/HDL Ratio: 2.9 (calc) (ref <5.0)
Triglycerides: 77 mg/dL (ref <150)

## 2019-06-30 ENCOUNTER — Encounter: Payer: Self-pay | Admitting: Family Medicine

## 2019-09-27 ENCOUNTER — Ambulatory Visit: Payer: Self-pay | Admitting: Family Medicine

## 2019-09-28 ENCOUNTER — Ambulatory Visit: Payer: Self-pay | Admitting: Internal Medicine

## 2019-10-05 ENCOUNTER — Other Ambulatory Visit: Payer: Self-pay

## 2019-10-05 DIAGNOSIS — F419 Anxiety disorder, unspecified: Secondary | ICD-10-CM

## 2019-10-11 MED ORDER — CITALOPRAM HYDROBROMIDE 40 MG PO TABS: 40.0000 mg | ORAL_TABLET | Freq: Every day | ORAL | 3 refills | Status: DC

## 2019-11-29 ENCOUNTER — Telehealth: Payer: Self-pay

## 2019-11-29 NOTE — Telephone Encounter (Signed)
Please advise 

## 2019-11-29 NOTE — Telephone Encounter (Signed)
I have not seen any forms Copied from [place] [id number]. Topic: General - Inquiry >> Nov 29, 2019  8:15 AM Scherrie Gerlach wrote: Reason for CRM: pt states she dropped off a form to be filled out for her to be excused from taking the covid vaccine due to health issues.  Pt wants to know if Kristeen Miss has signed off on that yet? Pt doesn't want to take the covid vaccine

## 2019-11-30 NOTE — Telephone Encounter (Signed)
Patient stated she will drop off paperwork next week

## 2020-01-11 ENCOUNTER — Ambulatory Visit: Payer: Self-pay

## 2020-01-11 NOTE — Telephone Encounter (Signed)
Patient called stating that she has watery diarrhea.  She can have 6-10 per day. No blood.  She states that she has IBS for many years and her doctors have taken her off some medications. She states that she started to have leg cramps and umbilical pain.  She has been drinking plenty of water and she added salt to her diet to counteract the cramping. She states she has adequate urine output but it is a little darker.  She reports some dizziness when changing position. She was calling for referral for Colonoscopy and upper GI.  She is unable to do virtual visit because [place]  Per protocol patient was informed she should go to UC. Patient is refusing stating she will go if thing get worse.  Reason for Disposition . [1] SEVERE diarrhea (e.g., 7 or more times / day more than normal) AND [2] present > 24 hours (1 day)  Answer Assessment - Initial Assessment Questions 1. DIARRHEA SEVERITY: "How bad is the diarrhea?" "How many extra stools have you had in the past 24 hours than normal?"    - NO DIARRHEA (SCALE 0)   - MILD (SCALE 1-3): Few loose or mushy BMs; increase of 1-3 stools over normal daily number of stools; mild increase in ostomy output.   -  MODERATE (SCALE 4-7): Increase of 4-6 stools daily over normal; moderate increase in ostomy output. * SEVERE (SCALE 8-10; OR 'WORST POSSIBLE'): Increase of 7 or more stools daily over normal; moderate increase in ostomy output; incontinence.     4-10 daily 2. ONSET: "When did the diarrhea begin?"     IBS 3. BM CONSISTENCY: "How loose or watery is the diarrhea?"      watery 4. VOMITING: "Are you also vomiting?" If Yes, ask: "How many times in the past 24 hours?"     No 5. ABDOMINAL PAIN: "Are you having any abdominal pain?" If Yes, ask: "What does it feel like?" (e.g., crampy, dull, intermittent, constant)      Severe cramping yesterday 6. ABDOMINAL PAIN SEVERITY: If present, ask: "How bad is the pain?"  (e.g., Scale  1-10; mild, moderate, or severe)   - MILD (1-3): doesn't interfere with normal activities, abdomen soft and not tender to touch    - MODERATE (4-7): interferes with normal activities or awakens from sleep, tender to touch    - SEVERE (8-10): excruciating pain, doubled over, unable to do any normal activities      10 last year 7. ORAL INTAKE: If vomiting, "Have you been able to drink liquids?" "How much fluids have you had in the past 24 hours?"     Eating soup crackers 8. HYDRATION: "Any signs of dehydration?" (e.g., dry mouth [not just dry lips], too weak to stand, dizziness, new weight loss) "When did you last urinate?"    Dizzy thirsty  dark golden odor 9. EXPOSURE: "Have you traveled to a foreign country recently?" "Have you been exposed to anyone with diarrhea?" "Could you have eaten any food that was spoiled?"    N/A 10. ANTIBIOTIC USE: "Are you taking antibiotics now or have you taken antibiotics in the past 2 months?"      None 11. OTHER SYMPTOMS: "Do you have any other symptoms?" (e.g., fever, blood in stool)      no 12. PREGNANCY: "Is there any chance you are pregnant?" "When was your last menstrual period?"       N/A  Protocols used: DIARRHEA-A-AH

## 2020-01-12 NOTE — Telephone Encounter (Signed)
Patient was informed to go to [place] [facility]

## 2020-01-17 ENCOUNTER — Telehealth (INDEPENDENT_AMBULATORY_CARE_PROVIDER_SITE_OTHER): Payer: Self-pay | Admitting: Family Medicine

## 2020-01-17 ENCOUNTER — Telehealth: Payer: Self-pay | Admitting: Family Medicine

## 2020-01-17 ENCOUNTER — Encounter: Payer: Self-pay | Admitting: Family Medicine

## 2020-01-17 ENCOUNTER — Other Ambulatory Visit: Payer: Self-pay

## 2020-01-17 DIAGNOSIS — K219 Gastro-esophageal reflux disease without esophagitis: Secondary | ICD-10-CM

## 2020-01-17 DIAGNOSIS — R634 Abnormal weight loss: Secondary | ICD-10-CM

## 2020-01-17 DIAGNOSIS — K529 Noninfective gastroenteritis and colitis, unspecified: Secondary | ICD-10-CM

## 2020-01-17 DIAGNOSIS — R197 Diarrhea, unspecified: Secondary | ICD-10-CM

## 2020-01-17 DIAGNOSIS — R109 Unspecified abdominal pain: Secondary | ICD-10-CM

## 2020-01-17 DIAGNOSIS — R1110 Vomiting, unspecified: Secondary | ICD-10-CM

## 2020-01-17 DIAGNOSIS — E860 Dehydration: Secondary | ICD-10-CM

## 2020-01-17 MED ORDER — DICYCLOMINE HCL 20 MG PO TABS: 20.0000 mg | ORAL_TABLET | Freq: Three times a day (TID) | ORAL | 2 refills | Status: DC | PRN

## 2020-01-17 MED ORDER — PANTOPRAZOLE SODIUM 40 MG PO TBEC: 40.0000 mg | DELAYED_RELEASE_TABLET | Freq: Every day | ORAL | 3 refills | Status: DC

## 2020-01-17 MED ORDER — ONDANSETRON 4 MG PO TBDP: 4.0000 mg | ORAL_TABLET | Freq: Three times a day (TID) | ORAL | 1 refills | Status: DC | PRN

## 2020-01-17 MED ORDER — SUCRALFATE 1 G PO TABS: 1.0000 g | ORAL_TABLET | Freq: Three times a day (TID) | ORAL | 1 refills | Status: DC | PRN

## 2020-01-17 NOTE — Progress Notes (Signed)
Name: Leslie Evans   MRN: [id number]    DOB: 1955/04/24   Date:01/17/2020       Progress Note  Subjective:    Chief Complaint  Chief Complaint  Patient presents with  . Diarrhea    Patient has a history of IBS for 1 week. Bowel movement 4 times this morning  . Emesis    1 time today    I connected with  Vickii Chafe Panella on 94/85/46 at  9:20 AM EST by telephone and verified that I am speaking with the correct person using two identifiers.   I discussed the limitations, risks, security and privacy concerns of performing an evaluation and management service by telephone and the availability of in person appointments. Staff also discussed with the patient that there may be a patient responsible charge related to this service.  Patient verbalized understanding and agreed to proceed with encounter. Patient Location:  home Provider Location: [facility] clinic  Additional Individuals present: none  HPI  Hx of chronic diarrhea with recent worsening -patient's baseline for more than 10 years has been frequent loose and watery bowel movements, tenesmus, she immediately has bowel movement shortly after eating anything and she has multiple bowel movements per day. She reports that she started to work this up with her prior PCP, Dr. Lucita Lora many years ago, she had a colonoscopy and possibly some testing but she does not believe that she ever saw a gastroenterologist She reports of flares of the same symptoms that occur every once in a while she reports that in the last week she had nausea vomiting and worsening diarrhea with generalized fatigue, weakness, lightheaded episodes.  Vomiting as recently as today she states that it is "Burning from mouth to anus" and she has associated abdominal bloating urgency stool/pressure, gassiness. 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 [place], no known [place] or other sick contact or exposure  She is not on any current acid reflux or indigestion medications  -she notes that she stopped GERD medicine because of "cancer risk"  She reports a generally very healthy diet because of a family history of diabetes she is very concerned about her thyroid and possibility of the symptoms being related to her having diabetes  She also endorses gradual weight loss  In the past she was on Lomotil to help limit her frequency of bowel movements so that she could function After many years of use it did not seem to help very much so it was stopped over the last 1 to 2 years She has been using Imodium over-the-counter and that has not helped slow down her bowel movements  She denies any fever chills, rash, upper respiratory symptoms    Patient Active Problem List   Diagnosis Date Noted  . Essential hypertension 11/27/2014  . IBS (irritable bowel syndrome) 08/21/2014  . Lumbar disc disease with radiculopathy 08/21/2014  . Chronic anxiety 08/21/2014  . COPD with asthma ([place]) 07/21/2013  . Current tobacco use 07/21/2013    Social History   Tobacco Use  . Smoking status: Current Every Day Smoker    Packs/day: 0.50    Types: Cigarettes  . Smokeless tobacco: Never Used  Substance Use Topics  . Alcohol use: Yes    Alcohol/week: 0.0 standard drinks     Current Outpatient Medications:  .  acetaminophen (TYLENOL) 500 MG tablet, Take 500 mg by mouth every 6 (six) hours as needed., Disp: , Rfl:  .  albuterol (PROVENTIL HFA) 108 (90 Base)  MCG/ACT inhaler, Inhale 2 puffs into the lungs every 4 (four) hours as needed for wheezing or shortness of breath., Disp: 6.7 g, Rfl: 3 .  budesonide-formoterol (SYMBICORT) 160-4.5 MCG/ACT inhaler, Inhale 2 puffs into the lungs 2 (two) times daily., Disp: 1 Inhaler, Rfl: 11 .  citalopram (CELEXA) 40 MG tablet, Take 1 tablet (40 mg total) by mouth daily., Disp: 90 tablet, Rfl: 3 .  hydrocortisone (ANUSOL-HC) 25 MG suppository, Place 1 suppository (25 mg total) rectally 2 (two) times daily., Disp: 12 suppository, Rfl: 1 .   hydrOXYzine (VISTARIL) 25 MG capsule, Take 1-2 capsules (25-50 mg total) by mouth 2 (two) times daily as needed for anxiety (for anxiety attacks, and for insomnia)., Disp: 60 capsule, Rfl: 5 .  lisinopril (ZESTRIL) 40 MG tablet, Take 1 tablet (40 mg total) by mouth daily., Disp: 90 tablet, Rfl: 3 .  rosuvastatin (CRESTOR) 5 MG tablet, Take 1 tablet (5 mg total) by mouth daily., Disp: 90 tablet, Rfl: 3  Allergies  Allergen Reactions  . Gabapentin Dermatitis  . Ibuprofen Nausea And Vomiting  . Lamisil [Terbinafine] Swelling  . Pregabalin Other (See Comments)  . Tetracycline Other (See Comments)    Chart Review: I personally reviewed active problem list, medication list, allergies, family history, social history, health maintenance, notes from last encounter, lab results, imaging with the patient/caregiver today.   Review of Systems All other systems reviewed and are negative for acute change except as noted in the HPI.    Objective:    Virtual encounter, vitals limited, only able to obtain the following There were no vitals filed for this visit. There is no height or weight on file to calculate BMI. Nursing Note and Vital Signs reviewed.  Physical Exam Patient alert, talkative somewhat tangential and hyperactive, phonation clear, alert and overall answering questions appropriately, no audible respiratory distress or tachypnea PE limited by telephone encounter  No results found for this or any previous visit (from the past 72 hour(s)).  Assessment and Plan:     ICD-10-CM   1. Dehydration  E86.0 Urinalysis, Routine w reflex microscopic    ondansetron (ZOFRAN ODT) 4 MG disintegrating tablet   Push fluids, antiemetics, advance diet very slowly can return to work when bowels return to her baseline and no vomiting for 24 hours and no fever for 24 h  2. Diarrhea, unspecified type  R19.7 Ambulatory referral to Gastroenterology    TSH    COMPLETE METABOLIC PANEL WITH GFR     Urinalysis, Routine w reflex microscopic   Chronic diarrhea likely needs specialist evaluation and possibly pancreatic insufficiency issue?  She did have colonoscopy in the past but cannot see records  3. Gastroenteritis  K52.9 sucralfate (CARAFATE) 1 g tablet    dicyclomine (BENTYL) 20 MG tablet    CBC with Differential/Platelet    TSH    COMPLETE METABOLIC PANEL WITH GFR    Urinalysis, Routine w reflex microscopic    ondansetron (ZOFRAN ODT) 4 MG disintegrating tablet   Manage symptoms, advance diet slowly  4. Gastroesophageal reflux disease, unspecified whether esophagitis present  K21.9 sucralfate (CARAFATE) 1 g tablet    Ambulatory referral to Gastroenterology    pantoprazole (PROTONIX) 40 MG tablet   Restart PPI once daily for the next 1 to 2 months until evaluated by GI  5. Abdominal pain, unspecified abdominal location  R10.9 sucralfate (CARAFATE) 1 g tablet    dicyclomine (BENTYL) 20 MG tablet    pantoprazole (PROTONIX) 40 MG tablet    CBC with  Differential/Platelet    COMPLETE METABOLIC PANEL WITH GFR    Urinalysis, Routine w reflex microscopic    ondansetron (ZOFRAN ODT) 4 MG disintegrating tablet   Very long history of chronic diarrhea, recent acute illness x1 week, generalized pain and burning suspect gastroenteritis  6. Vomiting, intractability of vomiting not specified, presence of nausea not specified, unspecified vomiting type  R11.10 CBC with Differential/Platelet    COMPLETE METABOLIC PANEL WITH GFR    Urinalysis, Routine w reflex microscopic    ondansetron (ZOFRAN ODT) 4 MG disintegrating tablet   tx with antiemetics, push clear fluids with some sugar and electrolytes, advance diet very slowly, start treating GERD/gastritis PPI  7. Weight loss  R63.4 CBC with Differential/Platelet    TSH    COMPLETE METABOLIC PANEL WITH GFR   chronic diarrhea and gradual weight loss - GI f/up, test basic labs and thyroid    Needs in office f/up in the next ~3 months for other acute  concerns and for her other routine OV  -Red flags and when to present for emergency care or RTC including but not limited to new/worsening/un-resolving symptoms,  reviewed with patient at time of visit. Follow up and care instructions discussed and provided in AVS. - I discussed the assessment and treatment plan with the patient. The patient was provided an opportunity to ask questions and all were answered. The patient agreed with the plan and demonstrated an understanding of the instructions.  - The patient was advised to call back or seek an in-person evaluation if the symptoms worsen or if the condition fails to improve as anticipated.  I provided 30+ minutes of non-face-to-face time during this encounter.  Delsa Grana, PA-C 01/17/20 9:37 AM

## 2020-01-18 ENCOUNTER — Telehealth: Payer: Self-pay

## 2020-01-18 ENCOUNTER — Other Ambulatory Visit: Payer: Self-pay

## 2020-01-18 ENCOUNTER — Encounter: Payer: Self-pay | Admitting: Gastroenterology

## 2020-01-18 ENCOUNTER — Ambulatory Visit (INDEPENDENT_AMBULATORY_CARE_PROVIDER_SITE_OTHER): Payer: Self-pay | Admitting: Gastroenterology

## 2020-01-18 VITALS — BP 105/72 | HR 60 | Temp 97.8°F | Ht 65.0 in | Wt 113.0 lb

## 2020-01-18 DIAGNOSIS — R634 Abnormal weight loss: Secondary | ICD-10-CM

## 2020-01-18 DIAGNOSIS — K529 Noninfective gastroenteritis and colitis, unspecified: Secondary | ICD-10-CM

## 2020-01-18 LAB — CBC WITH DIFFERENTIAL/PLATELET
Absolute Monocytes: 551 cells/uL (ref 200–950)
Basophils Absolute: 112 cells/uL (ref 0–200)
Basophils Relative: 1.1 %
Eosinophils Absolute: 449 cells/uL (ref 15–500)
Eosinophils Relative: 4.4 %
HCT: 45 % (ref 35.0–45.0)
Hemoglobin: 15.3 g/dL (ref 11.7–15.5)
Lymphs Abs: 4651 cells/uL — ABNORMAL HIGH (ref 850–3900)
MCH: 30.2 pg (ref 27.0–33.0)
MCHC: 34 g/dL (ref 32.0–36.0)
MCV: 88.8 fL (ref 80.0–100.0)
MPV: 11.3 fL (ref 7.5–12.5)
Monocytes Relative: 5.4 %
Neutro Abs: 4437 cells/uL (ref 1500–7800)
Neutrophils Relative %: 43.5 %
Platelets: 297 10*3/uL (ref 140–400)
RBC: 5.07 10*6/uL (ref 3.80–5.10)
RDW: 13.4 % (ref 11.0–15.0)
Total Lymphocyte: 45.6 %
WBC: 10.2 10*3/uL (ref 3.8–10.8)

## 2020-01-18 LAB — URINALYSIS, ROUTINE W REFLEX MICROSCOPIC
Bilirubin Urine: NEGATIVE
Glucose, UA: NEGATIVE
Hgb urine dipstick: NEGATIVE
Ketones, ur: NEGATIVE
Leukocytes,Ua: NEGATIVE
Nitrite: NEGATIVE
Protein, ur: NEGATIVE
Specific Gravity, Urine: 1.006 (ref 1.001–1.03)
pH: 5.5 (ref 5.0–8.0)

## 2020-01-18 LAB — COMPLETE METABOLIC PANEL WITH GFR
AG Ratio: 1.9 (calc) (ref 1.0–2.5)
ALT: 13 U/L (ref 6–29)
AST: 14 U/L (ref 10–35)
Albumin: 4.2 g/dL (ref 3.6–5.1)
Alkaline phosphatase (APISO): 82 U/L (ref 37–153)
BUN: 8 mg/dL (ref 7–25)
CO2: 25 mmol/L (ref 20–32)
Calcium: 10.2 mg/dL (ref 8.6–10.4)
Chloride: 108 mmol/L (ref 98–110)
Creat: 0.76 mg/dL (ref 0.50–0.99)
GFR, Est African American: 96 mL/min/{1.73_m2} (ref >=60)
GFR, Est Non African American: 83 mL/min/{1.73_m2} (ref >=60)
Globulin: 2.2 g/dL (calc) (ref 1.9–3.7)
Glucose, Bld: 99 mg/dL (ref 65–99)
Potassium: 4.3 mmol/L (ref 3.5–5.3)
Sodium: 140 mmol/L (ref 135–146)
Total Bilirubin: 0.6 mg/dL (ref 0.2–1.2)
Total Protein: 6.4 g/dL (ref 6.1–8.1)

## 2020-01-18 LAB — TSH: TSH: 2.28 mIU/L (ref 0.40–4.50)

## 2020-01-18 NOTE — Progress Notes (Signed)
Cephas Darby, MD [address]  [place]  [place],  [address]  Main: [phone]  Fax: [phone]    Gastroenterology Consultation  Referring Provider:     Delsa Grana, PA-C Primary Care Physician:  Delsa Grana, PA-C Primary Gastroenterologist:  Dr. Cephas Darby Reason for Consultation:     Chronic diarrhea and weight loss        HPI:   Leslie Evans is a 65 y.o. female referred by Dr. Delsa Grana, PA-C  for consultation & management of chronic diarrhea and weight loss.  Patient reports more than 10 years history of nonbloody watery bowel movements, 6-8 times per day, every time she eats, she has diarrheal episode.  She also reports lower abdominal cramps associated with diarrhea.  She occasionally notices blood in her stool.  She does report nausea.  She had episodes of dehydration that resulted in dizzy spells.  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  She has also lost about 20 pounds over the last few years and her weight is now rapidly declining.  Her baseline weight is 136 pounds, today she weighs 113 pounds.  She denies lack of appetite.  She manages to drink protein supplements at work.  Her labs including CBC, CMP and TSH are unremarkable.  States she smokes half pack cigarettes per day since age of 56.  She tried Lomotil in the past which did not help.  She is recently started on Bentyl by her PCP.  NSAIDs: None  Antiplts/Anticoagulants/Anti thrombotics: None  GI Procedures: Colonoscopy approximately in 2004  Past Medical History:  Diagnosis Date  . Anxiety   . Asthma   . Cervicalgia   . COVID-19   . Hyperlipidemia     History reviewed. No pertinent surgical history.  Current Outpatient Medications:  .  acetaminophen (TYLENOL) 500 MG tablet, Take 500 mg by mouth every 6 (six) hours as needed., Disp: , Rfl:  .  albuterol (PROVENTIL HFA) 108 (90 Base) MCG/ACT inhaler,  Inhale 2 puffs into the lungs every 4 (four) hours as needed for wheezing or shortness of breath., Disp: 6.7 g, Rfl: 3 .  budesonide-formoterol (SYMBICORT) 160-4.5 MCG/ACT inhaler, Inhale 2 puffs into the lungs 2 (two) times daily., Disp: 1 Inhaler, Rfl: 11 .  citalopram (CELEXA) 40 MG tablet, Take 1 tablet (40 mg total) by mouth daily., Disp: 90 tablet, Rfl: 3 .  dicyclomine (BENTYL) 20 MG tablet, Take 1 tablet (20 mg total) by mouth 3 (three) times daily as needed for spasms (abd cramping)., Disp: 20 tablet, Rfl: 2 .  diphenoxylate-atropine (LOMOTIL) 2.5-0.025 MG tablet, Take by mouth., Disp: , Rfl:  .  hydrocortisone (ANUSOL-HC) 25 MG suppository, Place 1 suppository (25 mg total) rectally 2 (two) times daily., Disp: 12 suppository, Rfl: 1 .  hydrOXYzine (VISTARIL) 25 MG capsule, Take 1-2 capsules (25-50 mg total) by mouth 2 (two) times daily as needed for anxiety (for anxiety attacks, and for insomnia)., Disp: 60 capsule, Rfl: 5 .  lisinopril (ZESTRIL) 40 MG tablet, Take 1 tablet (40 mg total) by mouth daily., Disp: 90 tablet, Rfl: 3 .  ondansetron (ZOFRAN ODT) 4 MG disintegrating tablet, Take 1-2 tablets (4-8 mg total) by mouth every 8 (eight) hours as needed for nausea or vomiting., Disp: 30 tablet, Rfl: 1 .  pantoprazole (PROTONIX) 40 MG tablet, Take 1 tablet (40 mg total) by mouth daily., Disp: 30 tablet, Rfl: 3 .  rosuvastatin (  CRESTOR) 5 MG tablet, Take 1 tablet (5 mg total) by mouth daily., Disp: 90 tablet, Rfl: 3 .  sucralfate (CARAFATE) 1 g tablet, Take 1 tablet (1 g total) by mouth 3 (three) times daily with meals as needed. for GERD/reflux/epigastric pain when eating or drinking, Disp: 30 tablet, Rfl: 1 .  ALPRAZolam (XANAX) 0.5 MG tablet, Take by mouth. (Patient not taking: Reported on 01/18/2020), Disp: , Rfl:  .  cyclobenzaprine (FLEXERIL) 10 MG tablet, Take by mouth. (Patient not taking: Reported on 01/18/2020), Disp: , Rfl:  .  HYDROcodone-acetaminophen (NORCO) 7.5-325 MG tablet, Take  by mouth. (Patient not taking: Reported on 01/18/2020), Disp: , Rfl:    Family History  Problem Relation Age of Onset  . Cancer Mother   . Diabetes Mother   . Heart disease Mother   . Cancer Father   . Drug abuse Brother   . Diabetes Maternal Grandmother   . COPD Maternal Grandfather   . Cancer Brother      Social History   Tobacco Use  . Smoking status: Current Every Day Smoker    Packs/day: 0.50    Types: Cigarettes  . Smokeless tobacco: Never Used  Vaping Use  . Vaping Use: Some days  . Substances: CBD  Substance Use Topics  . Alcohol use: Yes    Alcohol/week: 0.0 standard drinks  . Drug use: No    Allergies as of 01/18/2020 - Review Complete 01/18/2020  Allergen Reaction Noted  . Gabapentin Dermatitis 08/21/2014  . Ibuprofen Nausea And Vomiting 10/09/2015  . Lamisil [terbinafine] Swelling 11/27/2014  . Pregabalin Other (See Comments) 08/21/2014  . Tetracycline Other (See Comments) 08/21/2014    Review of Systems:    All systems reviewed and negative except where noted in HPI.   Physical Exam:  BP 105/72 (BP Location: Left Arm, Patient Position: Sitting, Cuff Size: Normal)   Pulse 60   Temp 97.8 F (36.6 C) (Oral)   Ht 5\' 5"  (1.651 m)   Wt 113 lb (51.3 kg)   BMI 18.80 kg/m  No LMP recorded. Patient has had a hysterectomy.  General:   Alert, thin built, cachectic, pleasant and cooperative in NAD Head:  Normocephalic and atraumatic, bitemporal wasting. Eyes:  Sclera clear, no icterus.   Conjunctiva pink. Ears:  Normal auditory acuity. Nose:  No deformity, discharge, or lesions. Mouth:  No deformity or lesions,oropharynx pink & moist. Neck:  Supple; no masses or thyromegaly. Lungs:  Respirations even and unlabored.  Clear throughout to auscultation.   No wheezes, crackles, or rhonchi. No acute distress. Heart:  Regular rate and rhythm; no murmurs, clicks, rubs, or gallops. Abdomen:  Normal bowel sounds. Soft, scaphoid, mild lower abdominal tenderness,  non-distended without masses, hepatosplenomegaly or hernias noted.  No guarding or rebound tenderness.   Rectal: Not performed Msk:  Symmetrical without gross deformities. Good, equal movement & strength bilaterally. Pulses:  Normal pulses noted. Extremities:  No clubbing or edema.  No cyanosis. Neurologic:  Alert and oriented x3;  grossly normal neurologically. Skin:  Intact without significant lesions or rashes. No jaundice. Psych:  Alert and cooperative. Normal mood and affect.  Imaging Studies: None  Assessment and Plan:   Leslie Evans is a 65 y.o. pleasant Caucasian female with no significant past medical history seen in consultation for chronic nonbloody diarrhea with abdominal pain and unintentional weight loss  Recommend stool studies to rule out infection Check pancreatic fecal elastase levels Empiric trial of pancreatic enzymes, samples provided Recommend upper endoscopy as well  as colonoscopy with gastric, duodenal biopsies, TI evaluation, random colon biopsies   Follow up in 4 to 6 weeks   Cephas Darby, MD

## 2020-01-18 NOTE — Telephone Encounter (Signed)
Patient left a voicemail stating that she remembered that she had H pylori infection several years ago that took two antibiotics to clear up

## 2020-01-18 NOTE — Telephone Encounter (Signed)
Okay, planning to take gastric biopsies anyways during endoscopy  RV

## 2020-01-18 NOTE — Patient Instructions (Signed)
Please take 2 tablets of Creon with the first bite of each meal and 1 tablet with the first bite of each snack

## 2020-01-27 ENCOUNTER — Other Ambulatory Visit
Admission: RE | Admit: 2020-01-27 | Discharge: 2020-01-27 | Disposition: A | Discharge status: Home / Self Care | Payer: Self-pay | Source: Ambulatory Visit | Attending: Gastroenterology | Admitting: Gastroenterology

## 2020-01-27 ENCOUNTER — Telehealth: Payer: Self-pay

## 2020-01-27 ENCOUNTER — Other Ambulatory Visit: Payer: Self-pay

## 2020-01-27 DIAGNOSIS — Z20822 Contact with and (suspected) exposure to covid-19: Secondary | ICD-10-CM | POA: Insufficient documentation

## 2020-01-27 DIAGNOSIS — Z01812 Encounter for preprocedural laboratory examination: Secondary | ICD-10-CM | POA: Insufficient documentation

## 2020-01-27 NOTE — Telephone Encounter (Signed)
Juliann Pulse stated that this pt shoved her stool test in the [facility] box but it was not prepared properly  Juliann Pulse the lab tech states she is going to go on and send it but it does not have a label on the tube and the lab might throw it away since it was not given back properly

## 2020-01-28 LAB — SARS CORONAVIRUS 2 (TAT 6-24 HRS): SARS Coronavirus 2: NEGATIVE

## 2020-01-31 ENCOUNTER — Encounter
Admission: RE | Disposition: A | Discharge status: Home / Self Care | Payer: Self-pay | Source: Home / Self Care | Attending: Gastroenterology

## 2020-01-31 ENCOUNTER — Ambulatory Visit: Payer: Self-pay | Admitting: Anesthesiology

## 2020-01-31 ENCOUNTER — Encounter: Payer: Self-pay | Admitting: Gastroenterology

## 2020-01-31 ENCOUNTER — Ambulatory Visit
Admission: RE | Admit: 2020-01-31 | Discharge: 2020-01-31 | Disposition: A | Discharge status: Home / Self Care | Payer: Self-pay | Attending: Gastroenterology | Admitting: Gastroenterology

## 2020-01-31 ENCOUNTER — Other Ambulatory Visit: Payer: Self-pay

## 2020-01-31 DIAGNOSIS — R634 Abnormal weight loss: Secondary | ICD-10-CM

## 2020-01-31 DIAGNOSIS — K3189 Other diseases of stomach and duodenum: Secondary | ICD-10-CM | POA: Insufficient documentation

## 2020-01-31 DIAGNOSIS — K449 Diaphragmatic hernia without obstruction or gangrene: Secondary | ICD-10-CM | POA: Insufficient documentation

## 2020-01-31 DIAGNOSIS — K644 Residual hemorrhoidal skin tags: Secondary | ICD-10-CM | POA: Insufficient documentation

## 2020-01-31 DIAGNOSIS — Z681 Body mass index (BMI) 19 or less, adult: Secondary | ICD-10-CM | POA: Insufficient documentation

## 2020-01-31 DIAGNOSIS — Z79899 Other long term (current) drug therapy: Secondary | ICD-10-CM | POA: Insufficient documentation

## 2020-01-31 DIAGNOSIS — F17210 Nicotine dependence, cigarettes, uncomplicated: Secondary | ICD-10-CM | POA: Insufficient documentation

## 2020-01-31 DIAGNOSIS — K635 Polyp of colon: Secondary | ICD-10-CM | POA: Insufficient documentation

## 2020-01-31 DIAGNOSIS — K529 Noninfective gastroenteritis and colitis, unspecified: Secondary | ICD-10-CM

## 2020-01-31 DIAGNOSIS — Z8616 Personal history of COVID-19: Secondary | ICD-10-CM | POA: Insufficient documentation

## 2020-01-31 DIAGNOSIS — K52831 Collagenous colitis: Secondary | ICD-10-CM | POA: Insufficient documentation

## 2020-01-31 HISTORY — DX: Chronic obstructive pulmonary disease, unspecified: J44.9

## 2020-01-31 HISTORY — PX: COLONOSCOPY WITH PROPOFOL: SHX5780

## 2020-01-31 HISTORY — PX: ESOPHAGOGASTRODUODENOSCOPY: SHX5428

## 2020-01-31 SURGERY — COLONOSCOPY WITH PROPOFOL
Anesthesia: General

## 2020-01-31 MED ORDER — SODIUM CHLORIDE 0.9 % IV SOLN
INTRAVENOUS | Status: DC
  Administered 2020-01-31: 1000 mL via INTRAVENOUS

## 2020-01-31 MED ORDER — LIDOCAINE HCL (CARDIAC) PF 100 MG/5ML IV SOSY
PREFILLED_SYRINGE | INTRAVENOUS | Status: DC | PRN
  Administered 2020-01-31: 80 mg via INTRAVENOUS

## 2020-01-31 MED ORDER — MIDAZOLAM HCL 2 MG/2ML IJ SOLN
INTRAMUSCULAR | Status: AC
  Filled 2020-01-31: qty 2

## 2020-01-31 MED ORDER — PROPOFOL 500 MG/50ML IV EMUL
INTRAVENOUS | Status: AC
  Filled 2020-01-31: qty 50

## 2020-01-31 MED ORDER — PROPOFOL 10 MG/ML IV BOLUS
INTRAVENOUS | Status: AC
  Filled 2020-01-31: qty 20

## 2020-01-31 MED ORDER — LIDOCAINE HCL (PF) 2 % IJ SOLN
INTRAMUSCULAR | Status: AC
  Filled 2020-01-31: qty 5

## 2020-01-31 MED ORDER — PROPOFOL 10 MG/ML IV BOLUS
INTRAVENOUS | Status: DC | PRN
  Administered 2020-01-31: 40 mg via INTRAVENOUS
  Administered 2020-01-31: 20 mg via INTRAVENOUS
  Administered 2020-01-31: 50 mg via INTRAVENOUS
  Administered 2020-01-31: 20 mg via INTRAVENOUS
  Administered 2020-01-31: 30 mg via INTRAVENOUS

## 2020-01-31 MED ORDER — PROPOFOL 500 MG/50ML IV EMUL
INTRAVENOUS | Status: DC | PRN
  Administered 2020-01-31: 180 ug/kg/min via INTRAVENOUS

## 2020-01-31 MED ORDER — MIDAZOLAM HCL 2 MG/2ML IJ SOLN
INTRAMUSCULAR | Status: DC | PRN
  Administered 2020-01-31: 1 mg via INTRAVENOUS

## 2020-01-31 NOTE — Transfer of Care (Signed)
Immediate Anesthesia Transfer of Care Note  Patient: Leslie Evans  Procedure(s) Performed: COLONOSCOPY WITH PROPOFOL (N/A ) ESOPHAGOGASTRODUODENOSCOPY (EGD) (N/A )  Patient Location: PACU  Anesthesia Type:General  Level of Consciousness: awake, alert  and oriented  Airway & Oxygen Therapy: Patient Spontanous Breathing and Patient connected to nasal cannula oxygen  Post-op Assessment: Report given to RN and Post -op Vital signs reviewed and stable  Post vital signs: Reviewed and stable  Last Vitals:  Vitals Value Taken Time  BP 123/72 01/31/20 1149  Temp 36.5 C 01/31/20 1149  Pulse 59 01/31/20 1151  Resp 13 01/31/20 1151  SpO2 100 % 01/31/20 1151  Vitals shown include unvalidated device data.  Last Pain:  Vitals:   01/31/20 1149  TempSrc: Temporal  PainSc: 0-No pain         Complications: No complications documented.

## 2020-01-31 NOTE — Anesthesia Preprocedure Evaluation (Addendum)
Anesthesia Evaluation  Patient identified by MRN, date of birth, ID band Patient awake    Reviewed: Allergy & Precautions, H&P , NPO status , reviewed documented beta blocker date and time   Airway Mallampati: I  TM Distance: >3 FB Neck ROM: full    Dental  (+) Chipped, Teeth Intact   Pulmonary asthma , COPD, Current Smoker and Patient abstained from smoking.,  Occasional inhaler use, feeling well today   Pulmonary exam normal        Cardiovascular hypertension, Normal cardiovascular exam     Neuro/Psych PSYCHIATRIC DISORDERS Anxiety  Neuromuscular disease    GI/Hepatic   Endo/Other    Renal/GU      Musculoskeletal   Abdominal   Peds  Hematology   Anesthesia Other Findings Past Medical History: No date: Anxiety No date: Asthma No date: Cervicalgia No date: COPD (chronic obstructive pulmonary disease) (HCC) No date: COVID-19 No date: Hyperlipidemia  Past Surgical History: No date: ABDOMINAL HYSTERECTOMY No date: TUBAL LIGATION     Reproductive/Obstetrics                            Anesthesia Physical Anesthesia Plan  ASA: III  Anesthesia Plan: General   Post-op Pain Management:    Induction: Intravenous  PONV Risk Score and Plan: Treatment may vary due to age or medical condition and TIVA  Airway Management Planned: Nasal Cannula and Natural Airway  Additional Equipment:   Intra-op Plan:   Post-operative Plan:   Informed Consent: I have reviewed the patients History and Physical, chart, labs and discussed the procedure including the risks, benefits and alternatives for the proposed anesthesia with the patient or authorized representative who has indicated his/her understanding and acceptance.     Dental Advisory Given  Plan Discussed with: CRNA  Anesthesia Plan Comments:        Anesthesia Quick Evaluation

## 2020-01-31 NOTE — H&P (Signed)
Leslie Darby, MD [address]  [place]  [place], [place] [address]  Main: [phone]  Fax: [phone] Pager: [phone]  Primary Care Physician:  Delsa Grana, PA-C Primary Gastroenterologist:  Dr. Cephas Evans  Pre-Procedure History & Physical: HPI:  Leslie Evans is a 65 y.o. female is here for an endoscopy and colonoscopy.   Past Medical History:  Diagnosis Date  . Anxiety   . Asthma   . Cervicalgia   . COPD (chronic obstructive pulmonary disease) ([facility])   . COVID-19   . Hyperlipidemia     Past Surgical History:  Procedure Laterality Date  . ABDOMINAL HYSTERECTOMY    . TUBAL LIGATION      Prior to Admission medications   Medication Sig Start Date End Date Taking? Authorizing Provider  acetaminophen (TYLENOL) 500 MG tablet Take 500 mg by mouth every 6 (six) hours as needed.   Yes [provider]  budesonide-formoterol (SYMBICORT) 160-4.5 MCG/ACT inhaler Inhale 2 puffs into the lungs 2 (two) times daily. 06/28/19  Yes Delsa Grana, PA-C  citalopram (CELEXA) 40 MG tablet Take 1 tablet (40 mg total) by mouth daily. 10/11/19  Yes Delsa Grana, PA-C  hydrOXYzine (VISTARIL) 25 MG capsule Take 1-2 capsules (25-50 mg total) by mouth 2 (two) times daily as needed for anxiety (for anxiety attacks, and for insomnia). 06/28/19  Yes Delsa Grana, PA-C  lisinopril (ZESTRIL) 40 MG tablet Take 1 tablet (40 mg total) by mouth daily. 06/28/19  Yes Delsa Grana, PA-C  ondansetron (ZOFRAN ODT) 4 MG disintegrating tablet Take 1-2 tablets (4-8 mg total) by mouth every 8 (eight) hours as needed for nausea or vomiting. 01/17/20  Yes Delsa Grana, PA-C  pantoprazole (PROTONIX) 40 MG tablet Take 1 tablet (40 mg total) by mouth daily. 01/17/20  Yes Delsa Grana, PA-C  rosuvastatin (CRESTOR) 5 MG tablet Take 1 tablet (5 mg total) by mouth daily. 06/28/19  Yes Delsa Grana, PA-C  albuterol (PROVENTIL HFA) 108 (90 Base) MCG/ACT inhaler Inhale 2 puffs into the lungs every 4 (four)  hours as needed for wheezing or shortness of breath. 06/28/19   Delsa Grana, PA-C  ALPRAZolam Duanne Moron) 0.5 MG tablet Take by mouth. Patient not taking: Reported on 01/18/2020    [provider]  cyclobenzaprine (FLEXERIL) 10 MG tablet Take by mouth. Patient not taking: Reported on 01/18/2020    [provider]  dicyclomine (BENTYL) 20 MG tablet Take 1 tablet (20 mg total) by mouth 3 (three) times daily as needed for spasms (abd cramping). 01/17/20   Delsa Grana, PA-C  diphenoxylate-atropine (LOMOTIL) 2.5-0.025 MG tablet Take by mouth.    [provider]  HYDROcodone-acetaminophen (NORCO) 7.5-325 MG tablet Take by mouth. Patient not taking: Reported on 01/18/2020    [provider]  hydrocortisone (ANUSOL-HC) 25 MG suppository Place 1 suppository (25 mg total) rectally 2 (two) times daily. 01/03/19   Hubbard Hartshorn, FNP  sucralfate (CARAFATE) 1 g tablet Take 1 tablet (1 g total) by mouth 3 (three) times daily with meals as needed. for GERD/reflux/epigastric pain when eating or drinking 01/17/20   Delsa Grana, PA-C    Allergies as of 01/18/2020 - Review Complete 01/18/2020  Allergen Reaction Noted  . Gabapentin Dermatitis 08/21/2014  . Ibuprofen Nausea And Vomiting 10/09/2015  . Lamisil [terbinafine] Swelling 11/27/2014  . Pregabalin Other (See Comments) 08/21/2014  . Tetracycline Other (See Comments) 08/21/2014    Family History  Problem Relation Age of Onset  . Cancer Mother   . Diabetes Mother   .  Heart disease Mother   . Cancer Father   . Drug abuse Brother   . Diabetes Maternal Grandmother   . COPD Maternal Grandfather   . Cancer Brother     Social History   Socioeconomic History  . Marital status: Divorced    Spouse name: Not on file  . Number of children: 3  . Years of education: Not on file  . Highest education level: Not on file  Occupational History  . 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 Smoking  status: Current Every Day Smoker    Packs/day: 0.50    Types: Cigarettes  . Smokeless tobacco: Never Used  Vaping Use  . Vaping Use: Former  . Substances: CBD  Substance and Sexual Activity  . Alcohol use: Yes    Alcohol/week: 0.0 standard drinks  . Drug use: No  . Sexual activity: Not Currently  Other Topics Concern  . 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 Neck:  Supple; no masses or thyromegaly. Lungs:  Clear throughout to auscultation.    Heart:  Regular rate and rhythm. Abdomen:  Soft, nontender and nondistended. Normal bowel sounds, without guarding, and without rebound.   Neurologic:  Alert and  oriented x4;  grossly normal neurologically.  Impression/Plan: Leslie Evans is here for an endoscopy and colonoscopy to be performed for chronic diarrhea and weight loss  Risks, benefits, limitations, and alternatives regarding  endoscopy and colonoscopy have been reviewed with the patient.  Questions have been answered.  All parties agreeable.   Sherri Sear, MD  01/31/2020, 10:31 AM

## 2020-01-31 NOTE — Anesthesia Postprocedure Evaluation (Signed)
Anesthesia Post Note  Patient: Leslie Evans  Procedure(s) Performed: COLONOSCOPY WITH PROPOFOL (N/A ) ESOPHAGOGASTRODUODENOSCOPY (EGD) (N/A )  Patient location during evaluation: Endoscopy Anesthesia Type: General Level of consciousness: awake and alert Pain management: pain level controlled Vital Signs Assessment: post-procedure vital signs reviewed and stable Respiratory status: spontaneous breathing, nonlabored ventilation and respiratory function stable Cardiovascular status: blood pressure returned to baseline and stable Postop Assessment: no apparent nausea or vomiting Anesthetic complications: no   No complications documented.   Last Vitals:  Vitals:   01/31/20 1001 01/31/20 1149  BP: (!) 148/103 123/72  Pulse: (!) 102   Resp: 18   Temp: 36.6 C 36.5 C  SpO2: 98%     Last Pain:  Vitals:   01/31/20 1209  TempSrc:   PainSc: 0-No pain                 Alphonsus Sias

## 2020-01-31 NOTE — Op Note (Signed)
[facility] Patient Name: Leslie Evans Procedure Date: 01/31/2020 10:34 AM MRN: [id number] Account #: [id number] Date of Birth: [birth year] Admit Type: Outpatient Age: 65 Room: [facility] ENDO ROOM 3 Gender: Female Note Status: Finalized Procedure:             Colonoscopy Indications:           Last colonoscopy: October 2010, Chronic diarrhea,                         Weight loss Providers:             [name] MD, MD Referring MD:          Delsa Grana (Referring MD) Medicines:             General Anesthesia Complications:         No immediate complications. Estimated blood loss: None. Procedure:             Pre-Anesthesia Assessment:                        - Prior to the procedure, a History and Physical was                         performed, and patient medications and allergies were                         reviewed. The patient is competent. The risks and                         benefits of the procedure and the sedation options and                         risks were discussed with the patient. All questions                         were answered and informed consent was obtained.                         Patient identification and proposed procedure were                         verified by the physician, the nurse, the                         anesthesiologist, the anesthetist and the technician                         in the pre-procedure area in the procedure room in the                         endoscopy suite. Mental Status Examination: alert and                         oriented. Airway Examination: normal oropharyngeal                         airway and neck mobility. Respiratory Examination:  clear to auscultation. CV Examination: normal.                         Prophylactic Antibiotics: The patient does not require                         prophylactic antibiotics. Prior Anticoagulants: The                          patient has taken no previous anticoagulant or                         antiplatelet agents. ASA Grade Assessment: III - A                         patient with severe systemic disease. After reviewing                         the risks and benefits, the patient was deemed in                         satisfactory condition to undergo the procedure. The                         anesthesia plan was to use general anesthesia.                         Immediately prior to administration of medications,                         the patient was re-assessed for adequacy to receive                         sedatives. The heart rate, respiratory rate, oxygen                         saturations, blood pressure, adequacy of pulmonary                         ventilation, and response to care were monitored                         throughout the procedure. The physical status of the                         patient was re-assessed after the procedure.                        After obtaining informed consent, the colonoscope was                         passed under direct vision. Throughout the procedure,                         the patient's blood pressure, pulse, and oxygen                         saturations were monitored continuously. The  Colonoscope was introduced through the anus and                         advanced to the the terminal ileum, with                         identification of the appendiceal orifice and IC                         valve. The colonoscopy was performed with moderate                         difficulty due to significant looping. Successful                         completion of the procedure was aided by applying                         abdominal pressure. The patient tolerated the                         procedure well. The quality of the bowel preparation                         was evaluated using the BBPS [facility] Bowel Preparation                          Scale) with scores of: Right Colon = 3, Transverse                         Colon = 3 and Left Colon = 3 (entire mucosa seen well                         with no residual staining, small fragments of stool or                         opaque liquid). The total BBPS score equals 9. Findings:      Hemorrhoids were found on perianal exam.      The terminal ileum appeared normal.      A diffuse area of granular mucosa was found in the ascending colon and       in the cecum. Biopsies were taken with a cold forceps for histology.      Normal mucosa was found in the sigmoid colon, in the descending colon,       in the transverse colon and in the distal ascending colon. Biopsies for       histology were taken with a cold forceps from the entire colon for       evaluation of microscopic colitis.      The rectum appeared normal.      Non-bleeding external hemorrhoids were found during retroflexion. The       hemorrhoids were medium-sized.      A 3 mm polyp was found in the descending colon. The polyp was sessile.       The polyp was removed with a cold biopsy forceps. Resection and       retrieval were complete. Impression:            -  Hemorrhoids found on perianal exam.                        - The examined portion of the ileum was normal.                        - Granularity in the ascending colon and in the cecum.                         Biopsied.                        - Normal mucosa in the sigmoid colon, in the                         descending colon, in the transverse colon and in the                         distal ascending colon. Biopsied.                        - The rectum is normal.                        - Non-bleeding external hemorrhoids.                        - One 3 mm polyp in the descending colon, removed with                         a cold biopsy forceps. Resected and retrieved. Recommendation:        - Discharge patient to home (with escort).                        - Resume  previous diet today.                        - Continue present medications.                        - Await pathology results.                        - Return to my office as previously scheduled. Procedure Code(s):     --- Professional ---                        [phone], Colonoscopy, flexible; with biopsy, single or                         multiple Diagnosis Code(s):     --- Professional ---                        K64.4, Residual hemorrhoidal skin tags                        K63.89, Other specified diseases of intestine                        K63.5, Polyp of colon  K52.9, Noninfective gastroenteritis and colitis,                         unspecified                        R63.4, Abnormal weight loss CPT copyright 2019 American Medical Association. All rights reserved. The codes documented in this report are preliminary and upon coder review may  be revised to meet current compliance requirements. Dr. Ulyess Mort [name] MD, MD 01/31/2020 11:41:31 AM This report has been signed electronically. Number of Addenda: 0 Note Initiated On: 01/31/2020 10:34 AM Scope Withdrawal Time: 0 hours 16 minutes 4 seconds  Total Procedure Duration: 0 hours 22 minutes 2 seconds  Estimated Blood Loss:  Estimated blood loss: none.      [facility]

## 2020-01-31 NOTE — Telephone Encounter (Signed)
[facility] threw away her stool sample because the patient did not drop it off properly and it was sent out wrong. Called and left a message for call back. Patient will need to repeat stool test

## 2020-01-31 NOTE — Anesthesia Procedure Notes (Signed)
Date/Time: 01/31/2020 11:10 AM Performed by: Allean Found, CRNA Pre-anesthesia Checklist: Patient identified, Emergency Drugs available, Suction available, Patient being monitored and Timeout performed Patient Re-evaluated:Patient Re-evaluated prior to induction Oxygen Delivery Method: Nasal cannula Placement Confirmation: positive ETCO2

## 2020-01-31 NOTE — Telephone Encounter (Signed)
Patient verbalized understanding and states she will come pick up stool sample tomorrow

## 2020-02-01 ENCOUNTER — Encounter: Payer: Self-pay | Admitting: Gastroenterology

## 2020-02-01 ENCOUNTER — Telehealth: Payer: Self-pay

## 2020-02-01 LAB — SURGICAL PATHOLOGY

## 2020-02-01 MED ORDER — BUDESONIDE 3 MG PO CPEP: 9.0000 mg | ORAL_CAPSULE | Freq: Every day | ORAL | 2 refills | Status: AC

## 2020-02-01 NOTE — Telephone Encounter (Signed)
Patient verbalized understanding. She asked if she needed to still take the Creon asked Dr. Marius Ditch and she states at this time she can stop the medication but to be sure she does the stool samples. She states she will go to [facility] today to pick those up

## 2020-02-01 NOTE — Telephone Encounter (Signed)
Called and left a message for call back for patient. Sent medication to [address]

## 2020-02-01 NOTE — Telephone Encounter (Signed)
-----   Message from [name], MD sent at 02/01/2020 11:32 AM EST ----- Pathology results from upper endoscopy came back normal Pathology results from colonoscopy confirm the diagnosis of collagenous colitis which is a benign inflammatory condition and nothing to worry. This explains her chronic diarrhea.  Recommend to start budesonide 3 mg capsules 3 pills daily in the morning for 3 months, will see her for follow-up on 3/1 as scheduled  Leslie Evans

## 2020-02-01 NOTE — Op Note (Signed)
[facility] Gastroenterology Patient Name: Leslie Evans Procedure Date: 01/31/2020 10:45 AM MRN: [id number] Account #: [id number] Date of Birth: 02/04/1955 Admit Type: Outpatient Age: 65 Room: [facility] ENDO ROOM 3 Gender: Female Note Status: Finalized Procedure:             Upper GI endoscopy Indications:           Diarrhea, Weight loss Providers:             [name] MD, MD Medicines:             General Anesthesia Complications:         No immediate complications. Estimated blood loss: None. Procedure:             Pre-Anesthesia Assessment:                        - Prior to the procedure, a History and Physical was                         performed, and patient medications and allergies were                         reviewed. The patient is competent. The risks and                         benefits of the procedure and the sedation options and                         risks were discussed with the patient. All questions                         were answered and informed consent was obtained.                         Patient identification and proposed procedure were                         verified by the physician, the nurse, the                         anesthesiologist, the anesthetist and the technician                         in the pre-procedure area in the procedure room in the                         endoscopy suite. Mental Status Examination: alert and                         oriented. Airway Examination: normal oropharyngeal                         airway and neck mobility. Respiratory Examination:                         clear to auscultation. CV Examination: normal.                         Prophylactic Antibiotics: The patient does not require  prophylactic antibiotics. Prior Anticoagulants: The                         patient has taken no previous anticoagulant or                         antiplatelet agents. ASA Grade  Assessment: III - A                         patient with severe systemic disease. After reviewing                         the risks and benefits, the patient was deemed in                         satisfactory condition to undergo the procedure. The                         anesthesia plan was to use general anesthesia.                         Immediately prior to administration of medications,                         the patient was re-assessed for adequacy to receive                         sedatives. The heart rate, respiratory rate, oxygen                         saturations, blood pressure, adequacy of pulmonary                         ventilation, and response to care were monitored                         throughout the procedure. The physical status of the                         patient was re-assessed after the procedure.                        After obtaining informed consent, the endoscope was                         passed under direct vision. Throughout the procedure,                         the patient's blood pressure, pulse, and oxygen                         saturations were monitored continuously. The Endoscope                         was introduced through the mouth, and advanced to the                         second part of duodenum. The upper GI endoscopy was  accomplished without difficulty. The patient tolerated                         the procedure well. Findings:      The examined duodenum was normal. Biopsies for histology were taken with       a cold forceps for evaluation of celiac disease.      Multiple dispersed diminutive erosions with no bleeding and no stigmata       of recent bleeding were found in the gastric antrum. Biopsies were taken       with a cold forceps for Helicobacter pylori testing.      The gastric body and incisura were normal. Biopsies were taken with a       cold forceps for Helicobacter pylori testing.      The  cardia and gastric fundus were normal on retroflexion.      A small hiatal hernia was present.      Esophagogastric landmarks were identified: the gastroesophageal junction       was found at 42 cm from the incisors.      The gastroesophageal junction and examined esophagus were normal. Impression:            - Normal examined duodenum. Biopsied.                        - Erosive gastropathy with no bleeding and no stigmata                         of recent bleeding. Biopsied.                        - Normal gastric body and incisura. Biopsied.                        - Small hiatal hernia.                        - Esophagogastric landmarks identified.                        - Normal gastroesophageal junction and esophagus. Recommendation:        - Await pathology results.                        - Proceed with colonoscopy as scheduled                        See colonoscopy report Procedure Code(s):     --- Professional ---                        [phone], Esophagogastroduodenoscopy, flexible,                         transoral; with biopsy, single or multiple Diagnosis Code(s):     --- Professional ---                        K31.89, Other diseases of stomach and duodenum                        K44.9, Diaphragmatic hernia without obstruction or  gangrene                        R19.7, Diarrhea, unspecified                        R63.4, Abnormal weight loss CPT copyright 2019 [employer]. All rights reserved. The codes documented in this report are preliminary and upon coder review may  be revised to meet current compliance requirements. Dr. Ulyess Mort [name] MD, MD 01/31/2020 11:12:36 AM This report has been signed electronically. Number of Addenda: 0 Note Initiated On: 01/31/2020 10:45 AM Estimated Blood Loss:  Estimated blood loss: none.      [facility]

## 2020-02-02 ENCOUNTER — Telehealth: Payer: Self-pay | Admitting: Gastroenterology

## 2020-02-02 NOTE — Telephone Encounter (Signed)
Patient verbalized understanding and will pick up letter on Monday

## 2020-02-02 NOTE — Telephone Encounter (Signed)
I am not willing to give FMLA.  We can provide out of work letter for 2 to 3 weeks that she requested  RV

## 2020-02-02 NOTE — Telephone Encounter (Signed)
Patient states she is weak and she is still having diarrhea but is better then it was. She states she called out of work since 01/17/2020. She states that she called her work this morning and states she would return to work on Tuesday but needs to be out till then. She states her work states she will need a note saying she is okay by [place] to be out of work and she might need FMAL. Informed patient I would ask if we could do the letter for all that time but we do not do FMAL that has to come from Primary care. She verbalized understanding she states the letter might be okay if we could excused her for all this week and last week. .  She states she told Dr. Marius Ditch that her  older son is having a mental break down on top of what she is dealing with

## 2020-02-02 NOTE — Telephone Encounter (Signed)
Patient is asking for excuse note for work. Please call to advise

## 2020-02-06 ENCOUNTER — Telehealth: Payer: Self-pay | Admitting: Gastroenterology

## 2020-02-06 NOTE — Telephone Encounter (Signed)
Patient called to ask why Dr Marius Ditch is referring her to another GI doctor

## 2020-02-06 NOTE — Telephone Encounter (Signed)
Informed patient we did not refer her to any office. She said okay she did not think so. She thinks it was a scam call

## 2020-02-17 ENCOUNTER — Ambulatory Visit (INDEPENDENT_AMBULATORY_CARE_PROVIDER_SITE_OTHER): Payer: Self-pay | Admitting: Family Medicine

## 2020-02-17 ENCOUNTER — Encounter: Payer: Self-pay | Admitting: Family Medicine

## 2020-02-17 ENCOUNTER — Other Ambulatory Visit: Payer: Self-pay

## 2020-02-17 VITALS — BP 112/68 | HR 74 | Temp 98.1°F | Resp 16 | Ht 65.0 in | Wt 108.2 lb

## 2020-02-17 DIAGNOSIS — K219 Gastro-esophageal reflux disease without esophagitis: Secondary | ICD-10-CM

## 2020-02-17 DIAGNOSIS — R634 Abnormal weight loss: Secondary | ICD-10-CM

## 2020-02-17 DIAGNOSIS — K52831 Collagenous colitis: Secondary | ICD-10-CM | POA: Insufficient documentation

## 2020-02-17 DIAGNOSIS — Z1231 Encounter for screening mammogram for malignant neoplasm of breast: Secondary | ICD-10-CM

## 2020-02-17 DIAGNOSIS — I10 Essential (primary) hypertension: Secondary | ICD-10-CM

## 2020-02-17 DIAGNOSIS — M5416 Radiculopathy, lumbar region: Secondary | ICD-10-CM

## 2020-02-17 DIAGNOSIS — Z5181 Encounter for therapeutic drug level monitoring: Secondary | ICD-10-CM

## 2020-02-17 DIAGNOSIS — J449 Chronic obstructive pulmonary disease, unspecified: Secondary | ICD-10-CM

## 2020-02-17 DIAGNOSIS — E7800 Pure hypercholesterolemia, unspecified: Secondary | ICD-10-CM

## 2020-02-17 MED ORDER — ALBUTEROL SULFATE HFA 108 (90 BASE) MCG/ACT IN AERS: 2.0000 | INHALATION_SPRAY | RESPIRATORY_TRACT | 3 refills | Status: DC | PRN

## 2020-02-17 MED ORDER — TIZANIDINE HCL 4 MG PO TABS: 2.0000 mg | ORAL_TABLET | Freq: Three times a day (TID) | ORAL | 1 refills | Status: DC | PRN

## 2020-02-17 NOTE — Progress Notes (Signed)
Patient ID: Leslie Evans, female    DOB: 04-10-1955, 65 y.o.   MRN: [id number]  PCP: Delsa Grana, PA-C  Chief Complaint  Patient presents with  . Follow-up    1 month recheck    Subjective:   Leslie Evans is a 65 y.o. female, presents to clinic with CC of the following:  HPI  Pt here for f/up  A month ago did a virtual visit:  Hx of chronic diarrhea with recent worsening -patient's baseline for more than 10 years has been frequent loose and watery bowel movements, tenesmus, she immediately has bowel movement shortly after eating anything and she has multiple bowel movements per day. She reports that she started to work this up with her prior PCP, Dr. Lucita Lora many years ago, she had a colonoscopy and possibly some testing but she does not believe that she ever saw a gastroenterologist She reports of flares of the same symptoms that occur every once in a while she reports that in the last week she had nausea vomiting and worsening diarrhea with generalized fatigue, weakness, lightheaded episodes.  Vomiting as recently as today she states that it is "Burning from mouth to anus" and she has associated abdominal bloating urgency stool/pressure, gassiness. 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 [place], no known [place] or other sick contact or exposure  She is not on any current acid reflux or indigestion medications -she notes that she stopped GERD medicine because of "cancer risk"  She reports a generally very healthy diet because of a family history of diabetes she is very concerned about her thyroid and possibility of the symptoms being related to her having diabetes  She also endorses gradual weight loss  In the past she was on Lomotil to help limit her frequency of bowel movements so that she could function After many years of use it did not seem to help very much so it was stopped over the last 1 to 2 years She has been using Imodium over-the-counter and that  has not helped slow down her bowel movements  She denies any fever chills, rash, upper respiratory symptoms  A&P on 1/11: Assessment and Plan:     ICD-10-CM   1. Dehydration  E86.0 Urinalysis, Routine w reflex microscopic    ondansetron (ZOFRAN ODT) 4 MG disintegrating tablet   Push fluids, antiemetics, advance diet very slowly can return to work when bowels return to her baseline and no vomiting for 24 hours and no fever for 24 h  2. Diarrhea, unspecified type  R19.7 Ambulatory referral to Gastroenterology    TSH    COMPLETE METABOLIC PANEL WITH GFR    Urinalysis, Routine w reflex microscopic   Chronic diarrhea likely needs specialist evaluation and possibly pancreatic insufficiency issue?  She did have colonoscopy in the past but cannot see records  3. Gastroenteritis  K52.9 sucralfate (CARAFATE) 1 g tablet    dicyclomine (BENTYL) 20 MG tablet    CBC with Differential/Platelet    TSH    COMPLETE METABOLIC PANEL WITH GFR    Urinalysis, Routine w reflex microscopic    ondansetron (ZOFRAN ODT) 4 MG disintegrating tablet   Manage symptoms, advance diet slowly  4. Gastroesophageal reflux disease, unspecified whether esophagitis present  K21.9 sucralfate (CARAFATE) 1 g tablet    Ambulatory referral to Gastroenterology    pantoprazole (PROTONIX) 40 MG tablet   Restart PPI once daily for the next 1 to 2 months until evaluated by GI  5.  Abdominal pain, unspecified abdominal location  R10.9 sucralfate (CARAFATE) 1 g tablet    dicyclomine (BENTYL) 20 MG tablet    pantoprazole (PROTONIX) 40 MG tablet    CBC with Differential/Platelet    COMPLETE METABOLIC PANEL WITH GFR    Urinalysis, Routine w reflex microscopic    ondansetron (ZOFRAN ODT) 4 MG disintegrating tablet   Very long history of chronic diarrhea, recent acute illness x1 week, generalized pain and burning suspect gastroenteritis  6. Vomiting, intractability of vomiting not specified,  presence of nausea not specified, unspecified vomiting type  R11.10 CBC with Differential/Platelet    COMPLETE METABOLIC PANEL WITH GFR    Urinalysis, Routine w reflex microscopic    ondansetron (ZOFRAN ODT) 4 MG disintegrating tablet   tx with antiemetics, push clear fluids with some sugar and electrolytes, advance diet very slowly, start treating GERD/gastritis PPI  7. Weight loss  R63.4 CBC with Differential/Platelet    TSH    COMPLETE METABOLIC PANEL WITH GFR   chronic diarrhea and gradual weight loss - GI f/up, test basic labs and thyroid    Needs in office f/up in the next ~3 months for other acute concerns and for her other routine OV  She has since gone to GI, had colonoscopy, lab work -  EGD normal, colonoscopy pertinent for collagenous colitis - tx with budesonide 3 mg 9 mg po daily in the am with 3 month GI f/up  Results and call documented : Pathology results from upper endoscopy came back normal Pathology results from colonoscopy confirm the diagnosis of collagenous colitis which is a benign inflammatory condition and nothing to worry. This explains her chronic diarrhea.  Recommend to start budesonide 3 mg capsules 3 pills daily in the morning for 3 months, will see her for follow-up on 3/1 as scheduled  GI- on steroid, was on PPI, anusol, lomotil, zofran, carafate, bentyl- not helping   COPD - on symbicort and albuterol inhaler- [place]    Anxiety - on celexa Depression screen [facility] 2/9 02/17/2020 02/17/2020 01/17/2020 01/17/2020 06/28/2019  Decreased Interest 0 0 0 0 0  Down, Depressed, Hopeless 1 1 0 0 0  PHQ - 2 Score 1 1 0 0 0  Altered sleeping 3 - 1 - 0  Tired, decreased energy 2 - 0 - 0  Change in appetite 2 - 1 - 0  Feeling bad or failure about yourself  0 - 0 - 0  Trouble concentrating 0 - 0 - 0  Moving slowly or fidgety/restless 0 - 0 - 0  Suicidal thoughts 0 - 0 - 0  PHQ-9 Score 8 - 2 - 0  Difficult doing work/chores Not difficult at all - Not  difficult at all - Not difficult at all  Some recent data might be hidden   GAD 7 : Generalized Anxiety Score 02/17/2020 01/17/2020 03/25/2018  Nervous, Anxious, on Edge 1 1 0  Control/stop worrying 1 0 0  Worry too much - different things 0 1 0  Trouble relaxing 1 1 1   Restless 0 0 0  Easily annoyed or irritable 0 0 0  Afraid - awful might happen 0 0 0  Total GAD 7 Score 3 3 1   Anxiety Difficulty Not difficult at all Not difficult at all Somewhat difficult    Wt Readings from Last 5 Encounters:     02/17/20 108 lb 3.2 oz (49.1 kg)  01/31/20 113 lb 3.7 oz (51.4 kg)  01/18/20 113 lb (51.3 kg)  06/28/19 115 lb 8 oz (  52.4 kg)   BMI Readings from Last 5 Encounters:     02/17/20 18.01 kg/m  01/31/20 18.84 kg/m  01/18/20 18.80 kg/m  06/28/19 19.22 kg/m   She has lost significant amount of weight since last year, more than 30 lbs She is slowly able to eat more and has less diarrhea.  She denies abd pain currently She is working with GI She does feel generally weaker and has less stamina     Patient Active Problem List   Diagnosis Date Noted  . Loss of weight   . Chronic diarrhea   . Essential hypertension 11/27/2014  . IBS (irritable bowel syndrome) 08/21/2014  . Lumbar disc disease with radiculopathy 08/21/2014  . Chronic anxiety 08/21/2014  . 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 Current tobacco use 07/21/2013      Current Outpatient Medications:  .  acetaminophen (TYLENOL) 500 MG tablet, Take 500 mg by mouth every 6 (six) hours as needed., Disp: , Rfl:  .  albuterol (PROVENTIL HFA) 108 (90 Base) MCG/ACT inhaler, Inhale 2 puffs into the lungs every 4 (four) hours as needed for wheezing or shortness of breath., Disp: 6.7 g, Rfl: 3 .  budesonide (ENTOCORT EC) 3 MG 24 hr capsule, Take 3 capsules (9 mg total) by mouth daily., Disp: 90 capsule, Rfl: 2 .  budesonide-formoterol (SYMBICORT) 160-4.5 MCG/ACT inhaler, Inhale 2 puffs into the lungs 2 (two) times daily., Disp: 1  Inhaler, Rfl: 11 .  citalopram (CELEXA) 40 MG tablet, Take 1 tablet (40 mg total) by mouth daily., Disp: 90 tablet, Rfl: 3 .  cyclobenzaprine (FLEXERIL) 10 MG tablet, Take by mouth., Disp: , Rfl:  .  dicyclomine (BENTYL) 20 MG tablet, Take 1 tablet (20 mg total) by mouth 3 (three) times daily as needed for spasms (abd cramping)., Disp: 20 tablet, Rfl: 2 .  diphenoxylate-atropine (LOMOTIL) 2.5-0.025 MG tablet, Take by mouth., Disp: , Rfl:  .  hydrocortisone (ANUSOL-HC) 25 MG suppository, Place 1 suppository (25 mg total) rectally 2 (two) times daily., Disp: 12 suppository, Rfl: 1 .  hydrOXYzine (VISTARIL) 25 MG capsule, Take 1-2 capsules (25-50 mg total) by mouth 2 (two) times daily as needed for anxiety (for anxiety attacks, and for insomnia)., Disp: 60 capsule, Rfl: 5 .  lisinopril (ZESTRIL) 40 MG tablet, Take 1 tablet (40 mg total) by mouth daily., Disp: 90 tablet, Rfl: 3 .  ondansetron (ZOFRAN ODT) 4 MG disintegrating tablet, Take 1-2 tablets (4-8 mg total) by mouth every 8 (eight) hours as needed for nausea or vomiting., Disp: 30 tablet, Rfl: 1 .  pantoprazole (PROTONIX) 40 MG tablet, Take 1 tablet (40 mg total) by mouth daily., Disp: 30 tablet, Rfl: 3 .  rosuvastatin (CRESTOR) 5 MG tablet, Take 1 tablet (5 mg total) by mouth daily., Disp: 90 tablet, Rfl: 3 .  sucralfate (CARAFATE) 1 g tablet, Take 1 tablet (1 g total) by mouth 3 (three) times daily with meals as needed. for GERD/reflux/epigastric pain when eating or drinking, Disp: 30 tablet, Rfl: 1 .  ALPRAZolam (XANAX) 0.5 MG tablet, Take by mouth. (Patient not taking: No sig reported), Disp: , Rfl:  .  HYDROcodone-acetaminophen (NORCO) 7.5-325 MG tablet, Take by mouth. (Patient not taking: No sig reported), Disp: , Rfl:    Allergies  Allergen Reactions  . Pregabalin Other (See Comments)  . Tetracycline Other (See Comments)  . Gabapentin Dermatitis  . Ibuprofen Nausea And Vomiting  . Lamisil [Terbinafine] Swelling     Social History    Tobacco Use  .  Smoking status: Current Every Day Smoker    Packs/day: 0.50    Types: Cigarettes  . Smokeless tobacco: Never Used  Vaping Use  . Vaping Use: Former  . Substances: CBD  Substance Use Topics  . Alcohol use: Yes    Alcohol/week: 0.0 standard drinks  . Drug use: No      Chart Review Today: I personally reviewed active problem list, medication list, allergies, family history, social history, health maintenance, notes from last encounter, lab results, imaging with the patient/caregiver today.   Review of Systems  Constitutional: Negative.   HENT: Negative.   Eyes: Negative.   Respiratory: Negative.   Cardiovascular: Negative.   Gastrointestinal: Negative.   Endocrine: Negative.   Genitourinary: Negative.   Musculoskeletal: Negative.   Skin: Negative.   Allergic/Immunologic: Negative.   Neurological: Negative.   Hematological: Negative.   Psychiatric/Behavioral: Negative.   All other systems reviewed and are negative.      Objective:   Vitals:   02/17/20 1111  BP: 112/68  Pulse: 74  Resp: 16  Temp: 98.1 F (36.7 C)  TempSrc: Oral  SpO2: 98%  Weight: 108 lb 3.2 oz (49.1 kg)  Height: 5\' 5"  (1.651 m)    Body mass index is 18.01 kg/m.  Physical Exam Vitals and nursing note reviewed.  Constitutional:      General: She is not in acute distress.    Appearance: Normal appearance. She is well-developed and underweight. She is not ill-appearing, toxic-appearing or diaphoretic.     Interventions: Face mask in place.  HENT:     Head: Normocephalic and atraumatic.     Right Ear: External ear normal.     Left Ear: External ear normal.  Eyes:     General: Lids are normal. No scleral icterus.       Right eye: No discharge.        Left eye: No discharge.     Conjunctiva/sclera: Conjunctivae normal.  Neck:     Trachea: Phonation normal. No tracheal deviation.  Cardiovascular:     Rate and Rhythm: Normal rate and regular rhythm.     Pulses: Normal  pulses.          Radial pulses are 2+ on the right side and 2+ on the left side.       Posterior tibial pulses are 2+ on the right side and 2+ on the left side.     Heart sounds: Normal heart sounds. No murmur heard. No friction rub. No gallop.   Pulmonary:     Effort: Pulmonary effort is normal. No respiratory distress.     Breath sounds: Normal breath sounds. No stridor. No wheezing, rhonchi or rales.  Chest:     Chest wall: No tenderness.  Abdominal:     General: Bowel sounds are normal. There is no distension.     Palpations: Abdomen is soft.  Musculoskeletal:     Right lower leg: No edema.     Left lower leg: No edema.  Skin:    General: Skin is warm and dry.     Coloration: Skin is not jaundiced or pale.     Findings: No rash.  Neurological:     Mental Status: She is alert.     Motor: No abnormal muscle tone.     Gait: Gait normal.  Psychiatric:        Attention and Perception: Attention normal.        Mood and Affect: Mood and affect normal.  Speech: Speech normal.        Behavior: Behavior is hyperactive. Behavior is cooperative.     Comments: talkative      Results for orders placed or performed during the hospital encounter of 01/31/20  Surgical pathology  Result Value Ref Range   SURGICAL PATHOLOGY      SURGICAL PATHOLOGY CASE: [id number] PATIENT: [facility]ology Report     Specimen Submitted: A. Duodenum, random; cbx B. Stomach, random; cbx C. Colon, cecum; cbx D. Colon, random; cbx E. Colon polyp, descending; cbx  Clinical History: Chronic diarrhea and weight loss K52.9 R63.4 EGD. Gastric erosions; hiatal hernia; colon polyp; hemorrhoids      DIAGNOSIS: A. DUODENUM, RANDOM; COLD BIOPSY: - DUODENAL MUCOSA WITH NO SIGNIFICANT PATHOLOGIC ALTERATION. - NEGATIVE FOR FEATURES OF CELIAC DISEASE. - NEGATIVE FOR DYSPLASIA AND MALIGNANCY.  B. STOMACH, RANDOM; COLD BIOPSY: - ANTRAL AND OXYNTIC MUCOSA WITH FOCAL, MINIMAL  CHRONIC INFLAMMATION. - NEGATIVE FOR ACTIVE INFLAMMATION AND H PYLORI. - NEGATIVE FOR INTESTINAL METAPLASIA, DYSPLASIA, AND MALIGNANCY.  C. COLON, CECUM; COLD BIOPSY: - COLLAGENOUS COLITIS. - NEGATIVE FOR DYSPLASIA AND MALIGNANCY.  D. COLON, RANDOM; COLD BIOPSY: - COLLAGENOUS COLITIS. - NEGATIVE FOR DYSPLASIA AND MALIGNA NCY.  E. COLON POLYP, DESCENDING; COLD BIOPSY: - FEATURES SUGGESTIVE OF HYPERPLASTIC POLYP. - BACKGROUND FEATURES OF COLLAGENOUS COLITIS. - NEGATIVE FOR DYSPLASIA AND MALIGNANCY.   GROSS DESCRIPTION: A. Labeled: cbx random duodenum Received: Formalin Collection time: 11:03 AM on 01/31/2020 Placed into formalin time: 11:03 AM on 01/31/2020 Tissue fragment(s): Multiple Size: Aggregate, 1.1 x 0.5 x 0.2 cm Description: Tan soft tissue fragments Entirely submitted in 1 cassette.  B. Labeled: cbx random gastric Received: Formalin Collection time: 11:07 AM on 01/31/2020 Placed into formalin time: 11:07 AM on 01/31/2020 Tissue fragment(s): Multiple Size: Aggregate, 1.4 x 0.6 x 0.2 cm Description: Tan soft tissue fragments Entirely submitted in 1 cassette.  C. Labeled: cbx cecum abnormal mucosa Received: Formalin Collection time: 11:23 AM on 01/31/2020 Placed into formalin time: 11:23 AM 01/31/2020 Tissue fragment(s): 2 Size: Range from 0.3-0.5 cm Description: Tan so ft tissue fragments Entirely submitted in 1 cassette.  D. Labeled: cbx random colon Received: Formalin Collection time: 11:29 AM on 01/31/2020 Placed into formalin time: 11:29 AM on 01/31/2020 Tissue fragment(s): Multiple Size: Aggregate, 1.5 x 0.7 x 0.2 cm Description: Tan-white soft tissue fragments Entirely submitted in 1 cassette.  E. Labeled: cbx polyp descending colon Received: Formalin Collection time: 11:33 AM on 01/31/2020 Placed into formalin time: 11:33 AM on 01/31/2020 Tissue fragment(s): 1 Size: 0.4 x 0.4 x 0.2 cm Description: Tan soft tissue fragment Entirely submitted in 1  cassette.  Final Diagnosis performed by Betsy Pries, MD.   Electronically signed 02/01/2020 9:56:20AM The electronic signature indicates that the named Attending Pathologist has evaluated the specimen Technical component performed at [facility], [address] [place], [place] [address] Lab: [phone] Dir: Rush Farmer, MD, MMM  Professional component performed at [facility], [facility], [place], [place], [place] [address] Lab: [phone] Dir: Dellia Nims. Reuel Derby, MD        Assessment & Plan:   1. Encounter for screening mammogram for malignant neoplasm of breast - MM 3D SCREEN BREAST BILATERAL; Future  2. Essential hypertension Stable, well controlled, BP at goal today On lisinopril Labs done recently and reviewed  3. Gastroesophageal reflux disease, unspecified whether esophagitis present Continue ppi - reviewed with GI  4. Pure hypercholesterolemia On crestor 5 mg - no myalgias SE or concerns at this time Labs  done June - will be due in about 4 months Lab Results  Component Value Date   CHOL 162 06/28/2019   HDL 56 06/28/2019   LDLCALC 89 06/28/2019   TRIG 77 06/28/2019   CHOLHDL 2.9 06/28/2019     5. COPD with asthma ([place]) Continued smoker, she feels its currently well controlled with symbicort and albuterol rescue inhaler - albuterol (PROVENTIL HFA) 108 (90 Base) MCG/ACT inhaler; Inhale 2 puffs into the lungs every 4 (four) hours as needed for wheezing or shortness of breath.  Dispense: 6.7 g; Refill: 3  6. Collagenous colitis Improving symptoms per GI specialists  7. Medication monitoring encounter   8. Lumbar radiculopathy Refill on meds - tiZANidine (ZANAFLEX) 4 MG tablet; Take 0.5-1 tablets (2-4 mg total) by mouth every 8 (eight) hours as needed for muscle spasms (muscle tightness).  Dispense: 30 tablet; Refill: 1  9. Weight loss - 5 more lbs in the last month  encouraged supplements in addition to current diet GI sx  improving, would like to at least avoid further weight loss- likely some malnutrition and malabsorption over the past 1-2 years - will likely be gradual improvement Wt Readings from Last 5 Encounters:  02/17/20 108 lb 3.2 oz (49.1 kg)  01/31/20 113 lb 3.7 oz (51.4 kg)  01/18/20 113 lb (51.3 kg)  06/28/19 115 lb 8 oz (52.4 kg)  02/07/19 116 lb 8 oz (52.8 kg)   BMI Readings from Last 5 Encounters:  02/17/20 18.01 kg/m  01/31/20 18.84 kg/m  01/18/20 18.80 kg/m  06/28/19 19.22 kg/m  02/07/19 19.39 kg/m         Delsa Grana, PA-C 02/17/20 11:34 AM

## 2020-02-17 NOTE — Patient Instructions (Addendum)
The pharmacy should have the citalopram 40 mg since Oct:  citalopram (CELEXA) 40 MG tablet 90 tablet 3 10/11/2019    Sig - Route: Take 1 tablet (40 mg total) by mouth daily. - Oral   Sent to pharmacy as: citalopram (CELEXA) 40 MG tablet   E-Prescribing Status: Receipt confirmed by pharmacy (10/11/2019 5:12 PM EDT)    [place]  [place], [place] [address]  Phone:  [phone] Fax:  [phone]   Try the lower dose of muscle relaxers - do over the counter meds, heat therapy, gentle stretching   Lumbosacral Radiculopathy Lumbosacral radiculopathy is a condition that involves the spinal nerves and nerve roots in the low back and bottom of the spine. The condition develops when these nerves and nerve roots move out of place or become inflamed and cause symptoms. What are the causes? This condition may be caused by:  Pressure from a disk that bulges out of place (herniated disk). A disk is a plate of soft cartilage that separates bones in the spine.  Disk changes that occur with age (disk degeneration).  A narrowing of the bones of the lower back (spinal stenosis).  A tumor.  An infection.  An injury that places sudden pressure on the disks that cushion the bones of your lower spine. What increases the risk? You are more likely to develop this condition if:  You are a female who is 66-81 years old.  You are a female who is 58-55 years old.  You use improper technique when lifting things.  You are overweight or live a sedentary lifestyle.  You smoke.  Your work requires frequent lifting.  You do repetitive activities that strain the spine. What are the signs or symptoms? Symptoms of this condition include:  Pain that goes down from your back into your legs (sciatica), usually on one side of the body. This is the most common symptom. The pain may be worse with sitting, coughing, or sneezing.  Pain and numbness in  your legs.  Muscle weakness.  Tingling.  Loss of bladder control or bowel control.   How is this diagnosed? This condition may be diagnosed based on:  Your symptoms and medical history.  A physical exam. If the pain is lasting, you may have tests, such as:  MRI scan.  X-ray.  CT scan.  A type of X-ray used to examine the spinal canal after injecting a dye into your spine (myelogram).  A test to measure how electrical impulses move through a nerve (nerve conduction study). How is this treated? Treatment may depend on the cause of the condition and may include:  Working with a physical therapist.  Taking pain medicine.  Applying heat and ice to affected areas.  Doing stretches to improve flexibility.  Doing exercises to strengthen back muscles.  Having chiropractic spinal manipulation.  Using transcutaneous electrical nerve stimulation (TENS) therapy.  Getting a steroid injection in the spine. In some cases, no treatment is needed. If the condition is long-lasting (chronic), or if symptoms are severe, treatment may involve surgery or lifestyle changes, such as following a weight-loss plan. Follow these instructions at home: Activity  Avoid bending and other activities that make the problem worse.  Maintain a proper position when standing or sitting: ? When standing, keep your upper back and neck straight, with your shoulders pulled back. Avoid slouching. ? When sitting, keep your back straight and relax your shoulders. Do not round your  shoulders or pull them backward.  Do not sit or stand in one place for long periods of time.  Take brief periods of rest throughout the day. This will reduce your pain. It is usually better to rest by lying down or standing, not sitting.  When you are resting for longer periods, mix in some mild activity or stretching between periods of rest. This will help to prevent stiffness and pain.  Get regular exercise. Ask your health  care provider what activities are safe for you. If you were shown how to do any exercises or stretches, do them as directed by your health care provider.  Do not lift anything that is heavier than 10 lb (4.5 kg) or the limit that you are told by your health care provider. Always use proper lifting technique, which includes: ? Bending your knees. ? Keeping the load close to your body. ? Avoiding twisting. Managing pain  If directed, put ice on the affected area: ? Put ice in a plastic bag. ? Place a towel between your skin and the bag. ? Leave the ice on for 20 minutes, 2-3 times a day.  If directed, apply heat to the affected area as often as told by your health care provider. Use the heat source that your health care provider recommends, such as a moist heat pack or a heating pad. ? Place a towel between your skin and the heat source. ? Leave the heat on for 20-30 minutes. ? Remove the heat if your skin turns bright red. This is especially important if you are unable to feel pain, heat, or cold. You may have a greater risk of getting burned.  Take over-the-counter and prescription medicines only as told by your health care provider. General instructions  Sleep on a firm mattress in a comfortable position. Try lying on your side with your knees slightly bent. If you lie on your back, put a pillow under your knees.  Do not drive or use heavy machinery while taking prescription pain medicine.  If your health care provider prescribed a diet or exercise program, follow it as directed.  Keep all follow-up visits as told by your health care provider. This is important. Contact a health care provider if:  Your pain does not improve over time, even when taking pain medicines. Get help right away if:  You develop severe pain.  Your pain suddenly gets worse.  You develop increasing weakness in your legs.  You lose the ability to control your bladder or bowel.  You have difficulty  walking or balancing.  You have a fever. Summary  Lumbosacral radiculopathy is a condition that occurs when the spinal nerves and nerve roots in the lower part of the spine move out of place or become inflamed and cause symptoms.  Symptoms include pain, numbness, and tingling that go down from your back into your legs (sciatica), muscle weakness, and loss of bladder control or bowel control.  If directed, apply ice or heat to the affected area as told by your health care provider.  Follow instructions about activity, rest, and proper lifting technique. This information is not intended to replace advice given to you by your health care provider. Make sure you discuss any questions you have with your health care provider. Document Revised: 11/03/2019 Document Reviewed: 11/03/2019 Elsevier Patient Education  2021 [employer].

## 2020-02-23 ENCOUNTER — Telehealth: Payer: Self-pay

## 2020-02-23 ENCOUNTER — Ambulatory Visit: Payer: Self-pay | Admitting: *Deleted

## 2020-02-23 NOTE — Telephone Encounter (Signed)
Sorry, I cannot provide her FMLA because there is no GI reason She is already being treated for collagenous colitis  RV

## 2020-02-23 NOTE — Telephone Encounter (Signed)
Called and left a message for call back  

## 2020-02-23 NOTE — Telephone Encounter (Signed)
Patient is calling to report upper left chest pain. Patient states worse with exertion- comes and goes- lasting minutes- all morning. Patient is having other symptoms- dizziness, weakness- advised per protocol- ED- patient does not want to go- no insurance coverage. Advised per office- go to ED.  Reason for Disposition . [1] Chest pain (or "angina") comes and goes AND [2] is happening more often (increasing in frequency) or getting worse (increasing in severity) (Exception: chest pains that last only a few seconds)  Answer Assessment - Initial Assessment Questions 1. LOCATION: "Where does it hurt?"       Left upper chest 2. RADIATION: "Does the pain go anywhere else?" (e.g., into neck, jaw, arms, back)     no 3. ONSET: "When did the chest pain begin?" (Minutes, hours or days)      Started early this morning- 131/77 68 4. PATTERN "Does the pain come and go, or has it been constant since it started?"  "Does it get worse with exertion?"      Comes and goes, yes- worse with exertion- not SOB 5. DURATION: "How long does it last" (e.g., seconds, minutes, hours)     minutes 6. SEVERITY: "How bad is the pain?"  (e.g., Scale 1-10; mild, moderate, or severe)    - MILD (1-3): doesn't interfere with normal activities     - MODERATE (4-7): interferes with normal activities or awakens from sleep    - SEVERE (8-10): excruciating pain, unable to do any normal activities       Moderate-7  7. CARDIAC RISK FACTORS: "Do you have any history of heart problems or risk factors for heart disease?" (e.g., angina, prior heart attack; diabetes, high blood pressure, high cholesterol, smoker, or strong family history of heart disease)     High BP, family history 8. PULMONARY RISK FACTORS: "Do you have any history of lung disease?"  (e.g., blood clots in lung, asthma, emphysema, birth control pills)     Asthma, bronchitis, COPD 9. CAUSE: "What do you think is causing the chest pain?"     Most recent diagnosis- worse  when working 10. OTHER SYMPTOMS: "Do you have any other symptoms?" (e.g., dizziness, nausea, vomiting, sweating, fever, difficulty breathing, cough)       Weakness, dizziness, nausea- medication 11. PREGNANCY: "Is there any chance you are pregnant?" "When was your last menstrual period?"       n/a  Protocols used: CHEST PAIN-A-AH

## 2020-02-23 NOTE — Telephone Encounter (Signed)
Patient is calling because she has had chest pain on the left side of her chest starting this morning. She states the pain comes and goes and last for a couple minutes when it happens. she states the pain only happens when she is exerting herself. She states her vitals are Pulse 68 BP 131/78. She took Aspirin 325mg  this morning. Denies any SOB or any other symptoms. Please advise what you recommend.   PCP recommended her to go to the ER or urgent care. She states she has no insurance and can not afford to go to the ER. 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    Patient states her work is wanting her to take 30 days FMAL. She wants to know if you can feel out paper work. She states that she can not get in to PCP office to talk to her about filling out paper work.

## 2020-02-24 NOTE — Telephone Encounter (Signed)
Called and left a message for call back  

## 2020-02-27 NOTE — Telephone Encounter (Signed)
Called and left a message for call  Back

## 2020-02-28 NOTE — Telephone Encounter (Signed)
FMLA is for weight loss, ongoing covid symptoms. Will bring paperwork by office to be filled out. WIll do a virtual appointment if need

## 2020-02-28 NOTE — Telephone Encounter (Signed)
Lvm to see if patient still needs an apt for FMLA.

## 2020-02-28 NOTE — Telephone Encounter (Addendum)
Pt still need fmla appt however leisa next available appt is around 03-27-2019. There was opening today pt unable to come in today. Pt stated the chest pains turn out to be pulled muscle

## 2020-02-29 ENCOUNTER — Telehealth (INDEPENDENT_AMBULATORY_CARE_PROVIDER_SITE_OTHER): Payer: Self-pay | Admitting: Family Medicine

## 2020-02-29 ENCOUNTER — Encounter: Payer: Self-pay | Admitting: Family Medicine

## 2020-02-29 ENCOUNTER — Other Ambulatory Visit: Payer: Self-pay

## 2020-02-29 DIAGNOSIS — K52831 Collagenous colitis: Secondary | ICD-10-CM

## 2020-02-29 DIAGNOSIS — Z0289 Encounter for other administrative examinations: Secondary | ICD-10-CM

## 2020-02-29 DIAGNOSIS — R634 Abnormal weight loss: Secondary | ICD-10-CM

## 2020-02-29 DIAGNOSIS — R5383 Other fatigue: Secondary | ICD-10-CM

## 2020-02-29 NOTE — Progress Notes (Signed)
Name: Leslie Evans   MRN: [id number]    DOB: 27-May-1955   Date:02/29/2020       Progress Note  Subjective:    Chief Complaint  Chief Complaint  Patient presents with  . Consult    Discuss FMLA paperwork  . Spasms    Affecting her strength on left side upper chest  started on 02/23/20    I connected with  Vickii Chafe Cambria on 82/42/35 at  9:20 AM EST by telephone and verified that I am speaking with the correct person using two identifiers.   I discussed the limitations, risks, security and privacy concerns of performing an evaluation and management service by telephone and the availability of in person appointments. Staff also discussed with the patient that there may be a patient responsible charge related to this service.  Patient verbalized understanding and agreed to proceed with encounter. Patient Location: home Provider Location: [facility] office Additional Individuals present: none  HPI   Pt presents via telephone encounter  Complains of continued weakness, fatigue - difficulty doing long shifts and picking up extra work.  She requests FMLA paperwork Past couple visits for profuse frequent diarrhea for years was worsening.  She has lost weight, followed up with GI, was dx of collagenous colitis, she is compliant with meds and f/up, colonoscopy done, she has some improvement in sx, but has not been able to gain weight and she still does not feel like herself.  Paperwork brought in this week She also complains of Pulled left upper chest muscle on thursday  Wt Readings from Last 10 Encounters:  03/06/20 109 lb 2 oz (49.5 kg)  02/17/20 108 lb 3.2 oz (49.1 kg)  01/31/20 113 lb 3.7 oz (51.4 kg)  01/18/20 113 lb (51.3 kg)  06/28/19 115 lb 8 oz (52.4 kg)  02/07/19 116 lb 8 oz (52.8 kg)  02/04/19 119 lb (54 kg)  01/03/19 119 lb 6.4 oz (54.2 kg)  08/02/18 132 lb 1.6 oz (59.9 kg)  03/25/18 132 lb (59.9 kg)   BMI Readings from Last 10 Encounters:  03/06/20 18.16 kg/m  02/17/20 18.01  kg/m  01/31/20 18.84 kg/m  01/18/20 18.80 kg/m  06/28/19 19.22 kg/m  02/07/19 19.39 kg/m  02/04/19 19.80 kg/m  01/03/19 19.87 kg/m  08/02/18 21.98 kg/m  03/25/18 22.31 kg/m       Patient Active Problem List   Diagnosis Date Noted  . Collagenous colitis 02/17/2020  . Loss of weight   . Chronic diarrhea   . Essential hypertension 11/27/2014  . IBS (irritable bowel syndrome) 08/21/2014  . Lumbar disc disease with radiculopathy 08/21/2014  . Chronic anxiety 08/21/2014  . 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 Current tobacco use 07/21/2013    Social History   Tobacco Use  . Smoking status: Current Every Day Smoker    Packs/day: 0.50    Types: Cigarettes  . Smokeless tobacco: Never Used  Substance Use Topics  . Alcohol use: Yes    Alcohol/week: 0.0 standard drinks     Current Outpatient Medications:  .  acetaminophen (TYLENOL) 500 MG tablet, Take 500 mg by mouth every 6 (six) hours as needed., Disp: , Rfl:  .  albuterol (PROVENTIL HFA) 108 (90 Base) MCG/ACT inhaler, Inhale 2 puffs into the lungs every 4 (four) hours as needed for wheezing or shortness of breath., Disp: 6.7 g, Rfl: 3 .  budesonide (ENTOCORT EC) 3 MG 24 hr capsule, Take 3 capsules (9 mg total) by mouth daily., Disp: 90 capsule,  Rfl: 2 .  budesonide-formoterol (SYMBICORT) 160-4.5 MCG/ACT inhaler, Inhale 2 puffs into the lungs 2 (two) times daily., Disp: 1 Inhaler, Rfl: 11 .  citalopram (CELEXA) 40 MG tablet, Take 1 tablet (40 mg total) by mouth daily., Disp: 90 tablet, Rfl: 3 .  hydrocortisone (ANUSOL-HC) 25 MG suppository, Place 1 suppository (25 mg total) rectally 2 (two) times daily., Disp: 12 suppository, Rfl: 1 .  hydrOXYzine (VISTARIL) 25 MG capsule, Take 1-2 capsules (25-50 mg total) by mouth 2 (two) times daily as needed for anxiety (for anxiety attacks, and for insomnia)., Disp: 60 capsule, Rfl: 5 .  lisinopril (ZESTRIL) 40 MG tablet, Take 1 tablet (40 mg total) by mouth daily., Disp: 90  tablet, Rfl: 3 .  ondansetron (ZOFRAN ODT) 4 MG disintegrating tablet, Take 1-2 tablets (4-8 mg total) by mouth every 8 (eight) hours as needed for nausea or vomiting., Disp: 30 tablet, Rfl: 1 .  pantoprazole (PROTONIX) 40 MG tablet, Take 1 tablet (40 mg total) by mouth daily., Disp: 30 tablet, Rfl: 3 .  rosuvastatin (CRESTOR) 5 MG tablet, Take 1 tablet (5 mg total) by mouth daily., Disp: 90 tablet, Rfl: 3 .  sucralfate (CARAFATE) 1 g tablet, Take 1 tablet (1 g total) by mouth 3 (three) times daily with meals as needed. for GERD/reflux/epigastric pain when eating or drinking, Disp: 30 tablet, Rfl: 1 .  tiZANidine (ZANAFLEX) 4 MG tablet, Take 0.5-1 tablets (2-4 mg total) by mouth every 8 (eight) hours as needed for muscle spasms (muscle tightness)., Disp: 30 tablet, Rfl: 1  Allergies  Allergen Reactions  . Pregabalin Other (See Comments)  . Tetracycline Other (See Comments)  . Gabapentin Dermatitis  . Ibuprofen Nausea And Vomiting  . Lamisil [Terbinafine] Swelling    Chart Review: I personally reviewed active problem list, medication list, allergies, family history, social history, health maintenance, notes from last encounter, lab results, imaging with the patient/caregiver today.   Review of Systems  Constitutional: Negative.   HENT: Negative.   Eyes: Negative.   Respiratory: Negative.   Cardiovascular: Negative.   Gastrointestinal: Negative.   Endocrine: Negative.   Genitourinary: Negative.   Musculoskeletal: Negative.   Skin: Negative.   Allergic/Immunologic: Negative.   Neurological: Negative.   Hematological: Negative.   Psychiatric/Behavioral: Negative.   All other systems reviewed and are negative.    Objective:    Virtual encounter, vitals limited, only able to obtain the following There were no vitals filed for this visit. There is no height or weight on file to calculate BMI. Nursing Note and Vital Signs reviewed.  Physical Exam Vitals and nursing note  reviewed.  Pulmonary:     Effort: No respiratory distress.  Neurological:     Mental Status: She is alert.  Psychiatric:        Mood and Affect: Mood normal.        Behavior: Behavior normal.     PE limited by telephone encounter  No results found for this or any previous visit (from the past 72 hour(s)).  Assessment and Plan:     ICD-10-CM   1. Collagenous colitis  K52.831    improving with GI management  2. Weight loss  R63.4    30+ lbs, malnutrition and malabsorbance   3. Fatigue, unspecified type  R53.83   4. Encounter for completion of form with patient  Z02.89    fmla for GI issues/weight loss, decreased exercise tolerance, need for breaks, no working OT also have GI fill out   FMLA forms completed  today More than 30 min with pt and completing forms and medical documentation today - see scanned in documents Recommend FMLA for collagenous colitis dx and associated physical changes, weight loss, weakness, decreased ability to do physical work for extended periods of time - intermittent fmla recommended - back dated to Jan 2022 when she first came here with GI sx and then shortly afterwards saw GI, got dx and started management Recommend no OT, going home from work if not able to eat or manage BMs    - The patient was advised to call back or seek an in-person evaluation if the symptoms worsen or if the condition fails to improve as anticipated.  I provided 30+ minutes of non-face-to-face time during this encounter.  Delsa Grana, PA-C 02/29/20 10:09 AM

## 2020-03-06 ENCOUNTER — Telehealth: Payer: Self-pay

## 2020-03-06 ENCOUNTER — Ambulatory Visit (INDEPENDENT_AMBULATORY_CARE_PROVIDER_SITE_OTHER): Payer: Self-pay | Admitting: Gastroenterology

## 2020-03-06 ENCOUNTER — Encounter: Payer: Self-pay | Admitting: Gastroenterology

## 2020-03-06 ENCOUNTER — Other Ambulatory Visit: Payer: Self-pay

## 2020-03-06 VITALS — BP 113/69 | HR 65 | Temp 98.0°F | Ht 65.0 in | Wt 109.1 lb

## 2020-03-06 DIAGNOSIS — K52831 Collagenous colitis: Secondary | ICD-10-CM

## 2020-03-06 DIAGNOSIS — R634 Abnormal weight loss: Secondary | ICD-10-CM

## 2020-03-06 MED ORDER — BUDESONIDE 3 MG PO CPEP: 9.0000 mg | ORAL_CAPSULE | Freq: Every day | ORAL | 1 refills | Status: DC

## 2020-03-06 NOTE — Telephone Encounter (Signed)
Dr. Marius Ditch filled out [facility] for patient. Called patient and left a detail message informing patient that [facility] was ready for pick up

## 2020-03-06 NOTE — Progress Notes (Signed)
Cephas Darby, MD [address]  [place]  [place], [place] [address]  Main: [phone]  Fax: [phone]    Gastroenterology Consultation  Referring Provider:     Delsa Grana, PA-C Primary Care Physician:  Delsa Grana, PA-C Primary Gastroenterologist:  Dr. Cephas Darby Reason for Consultation:     Chronic diarrhea and weight loss        HPI:   Leslie Evans is a 65 y.o. female referred by Dr. Delsa Grana, PA-C  for consultation & management of chronic diarrhea and weight loss.  Patient reports more than 10 years history of nonbloody watery bowel movements, 6-8 times per day, every time she eats, she has diarrheal episode.  She also reports lower abdominal cramps associated with diarrhea.  She occasionally notices blood in her stool.  She does report nausea.  She had episodes of dehydration that resulted in dizzy spells.  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  She has also lost about 20 pounds over the last few years and her weight is now rapidly declining.  Her baseline weight is 136 pounds, today she weighs 113 pounds.  She denies lack of appetite.  She manages to drink protein supplements at work.  Her labs including CBC, CMP and TSH are unremarkable.  States she smokes half pack cigarettes per day since age of 61.  She tried Lomotil in the past which did not help.  She is recently started on Bentyl by her PCP.  Follow-up visit 03/06/2020 Patient is diagnosed with collagenous colitis which is a cause of her chronic diarrhea and weight loss.  There is no evidence of celiac disease, inflammatory bowel disease.  I started her on budesonide 3 mg 3 pills daily.  Her diarrhea has resolved.  Her stools are formed.  However, patient is struggling to gain weight.  She says, she has been eating small frequent meals.  She gained about 1 pound only.  She works as an Therapist, sports and her shift is 12 hours which is making her  very tired and not able to function well.  She is requesting if she can get an FMLA to work intermittently.  She reports that her appetite is good and would like to eat all the time.  NSAIDs: None  Antiplts/Anticoagulants/Anti thrombotics: None  GI Procedures: Colonoscopy approximately in 2004 EGD and colonoscopy 01/31/2020  - Normal examined duodenum. Biopsied. - Erosive gastropathy with no bleeding and no stigmata of recent bleeding. Biopsied. - Normal gastric body and incisura. Biopsied. - Small hiatal hernia. - Esophagogastric landmarks identified. - Normal gastroesophageal junction and esophagus.  - Hemorrhoids found on perianal exam. - The examined portion of the ileum was normal. - Granularity in the ascending colon and in the cecum. Biopsied. - Normal mucosa in the sigmoid colon, in the descending colon, in the transverse colon and in the distal ascending colon. Biopsied. - The rectum is normal. - Non-bleeding external hemorrhoids. - One 3 mm polyp in the descending colon, removed with a cold biopsy forceps. Resected and retrieved.  DIAGNOSIS:  A. DUODENUM, RANDOM; COLD BIOPSY:  - DUODENAL MUCOSA WITH NO SIGNIFICANT PATHOLOGIC ALTERATION.  - NEGATIVE FOR FEATURES OF CELIAC DISEASE.  - NEGATIVE FOR DYSPLASIA AND MALIGNANCY.   B. STOMACH, RANDOM; COLD BIOPSY:  - ANTRAL AND OXYNTIC MUCOSA WITH FOCAL, MINIMAL CHRONIC INFLAMMATION.  - NEGATIVE FOR ACTIVE INFLAMMATION AND H PYLORI.  - NEGATIVE FOR INTESTINAL METAPLASIA, DYSPLASIA,  AND MALIGNANCY.   C. COLON, CECUM; COLD BIOPSY:  - COLLAGENOUS COLITIS.  - NEGATIVE FOR DYSPLASIA AND MALIGNANCY.   D. COLON, RANDOM; COLD BIOPSY:  - COLLAGENOUS COLITIS.  - NEGATIVE FOR DYSPLASIA AND MALIGNANCY.   E. COLON POLYP, DESCENDING; COLD BIOPSY:  - FEATURES SUGGESTIVE OF HYPERPLASTIC POLYP.  - BACKGROUND FEATURES OF COLLAGENOUS COLITIS.  - NEGATIVE FOR DYSPLASIA AND MALIGNANCY.   Past Medical History:  Diagnosis Date  . Anxiety    . Asthma   . Cervicalgia   . COPD (chronic obstructive pulmonary disease) ([place])   . COVID-19   . H/O: hysterectomy 2004  . Hyperlipidemia     Past Surgical History:  Procedure Laterality Date  . ABDOMINAL HYSTERECTOMY    . COLONOSCOPY WITH PROPOFOL N/A 01/31/2020   Procedure: COLONOSCOPY WITH PROPOFOL;  Surgeon: [name], MD;  Location: [facility] ENDOSCOPY;  Service: Gastroenterology;  Laterality: N/A;  . ESOPHAGOGASTRODUODENOSCOPY N/A 01/31/2020   Procedure: ESOPHAGOGASTRODUODENOSCOPY (EGD);  Surgeon: [name], MD;  Location: [facility] ENDOSCOPY;  Service: Gastroenterology;  Laterality: N/A;  . TUBAL LIGATION      Current Outpatient Medications:  .  acetaminophen (TYLENOL) 500 MG tablet, Take 500 mg by mouth every 6 (six) hours as needed., Disp: , Rfl:  .  albuterol (PROVENTIL HFA) 108 (90 Base) MCG/ACT inhaler, Inhale 2 puffs into the lungs every 4 (four) hours as needed for wheezing or shortness of breath., Disp: 6.7 g, Rfl: 3 .  budesonide (ENTOCORT EC) 3 MG 24 hr capsule, Take 3 capsules (9 mg total) by mouth daily., Disp: 270 capsule, Rfl: 1 .  budesonide-formoterol (SYMBICORT) 160-4.5 MCG/ACT inhaler, Inhale 2 puffs into the lungs 2 (two) times daily., Disp: 1 Inhaler, Rfl: 11 .  citalopram (CELEXA) 40 MG tablet, Take 1 tablet (40 mg total) by mouth daily., Disp: 90 tablet, Rfl: 3 .  hydrocortisone (ANUSOL-HC) 25 MG suppository, Place 1 suppository (25 mg total) rectally 2 (two) times daily., Disp: 12 suppository, Rfl: 1 .  hydrOXYzine (VISTARIL) 25 MG capsule, Take 1-2 capsules (25-50 mg total) by mouth 2 (two) times daily as needed for anxiety (for anxiety attacks, and for insomnia)., Disp: 60 capsule, Rfl: 5 .  lisinopril (ZESTRIL) 40 MG tablet, Take 1 tablet (40 mg total) by mouth daily., Disp: 90 tablet, Rfl: 3 .  ondansetron (ZOFRAN ODT) 4 MG disintegrating tablet, Take 1-2 tablets (4-8 mg total) by mouth every 8 (eight) hours as needed for nausea or vomiting.,  Disp: 30 tablet, Rfl: 1 .  pantoprazole (PROTONIX) 40 MG tablet, Take 1 tablet (40 mg total) by mouth daily., Disp: 30 tablet, Rfl: 3 .  rosuvastatin (CRESTOR) 5 MG tablet, Take 1 tablet (5 mg total) by mouth daily., Disp: 90 tablet, Rfl: 3 .  sucralfate (CARAFATE) 1 g tablet, Take 1 tablet (1 g total) by mouth 3 (three) times daily with meals as needed. for GERD/reflux/epigastric pain when eating or drinking, Disp: 30 tablet, Rfl: 1 .  tiZANidine (ZANAFLEX) 4 MG tablet, Take 0.5-1 tablets (2-4 mg total) by mouth every 8 (eight) hours as needed for muscle spasms (muscle tightness)., Disp: 30 tablet, Rfl: 1   Family History  Problem Relation Age of Onset  . Cancer Mother   . Diabetes Mother   . Heart disease Mother   . Cancer Father   . Drug abuse Brother   . Diabetes Maternal Grandmother   . COPD Maternal Grandfather   . Cancer Brother      Social History   Tobacco Use  .  Smoking status: Current Every Day Smoker    Packs/day: 0.50    Types: Cigarettes  . Smokeless tobacco: Never Used  Vaping Use  . Vaping Use: Former  . Substances: CBD  Substance Use Topics  . Alcohol use: Yes    Alcohol/week: 0.0 standard drinks  . Drug use: No    Allergies as of 03/06/2020 - Review Complete 03/06/2020  Allergen Reaction Noted  . Pregabalin Other (See Comments) 08/21/2014  . Tetracycline Other (See Comments) 08/21/2014  . Gabapentin Dermatitis 08/21/2014  . Ibuprofen Nausea And Vomiting 10/09/2015  . Lamisil [terbinafine] Swelling 11/27/2014    Review of Systems:    All systems reviewed and negative except where noted in HPI.   Physical Exam:  BP 113/69 (BP Location: Left Arm, Patient Position: Sitting, Cuff Size: Normal)   Pulse 65   Temp 98 F (36.7 C) (Oral)   Ht 5\' 5"  (1.651 m)   Wt 109 lb 2 oz (49.5 kg)   BMI 18.16 kg/m  No LMP recorded. Patient has had a hysterectomy.  General:   Alert, thin built, cachectic, pleasant and cooperative in NAD Head:  Normocephalic and  atraumatic, bitemporal wasting. Eyes:  Sclera clear, no icterus.   Conjunctiva pink. Ears:  Normal auditory acuity. Nose:  No deformity, discharge, or lesions. Mouth:  No deformity or lesions,oropharynx pink & moist. Neck:  Supple; no masses or thyromegaly. Lungs:  Respirations even and unlabored.  Clear throughout to auscultation.   No wheezes, crackles, or rhonchi. No acute distress. Heart:  Regular rate and rhythm; no murmurs, clicks, rubs, or gallops. Abdomen:  Normal bowel sounds. Soft, scaphoid, nontender non-distended without masses, hepatosplenomegaly or hernias noted.  No guarding or rebound tenderness.   Rectal: Not performed Msk:  Symmetrical without gross deformities. Good, equal movement & strength bilaterally. Pulses:  Normal pulses noted. Extremities:  No clubbing or edema.  No cyanosis. Neurologic:  Alert and oriented x3;  grossly normal neurologically. Skin:  Intact without significant lesions or rashes. No jaundice. Psych:  Alert and cooperative. Normal mood and affect.  Imaging Studies: None  Assessment and Plan:   Bobby Ragan Charlesworth is a 65 y.o. pleasant Caucasian female with no significant past medical history seen in consultation for chronic nonbloody diarrhea with abdominal pain and unintentional weight loss, diagnosed with collagenous colitis and started on budesonide 9 mg daily.  Her diarrhea has resolved.  Patient is no longer losing weight, but struggling to gain weight at the same time.  Strongly advised her to consume at least 2000 cal daily Continue budesonide 9 mg daily.  Patient did not do stool studies to rule out infection or to rule out pancreatic insufficiency.  However her diarrhea has currently resolved  We will give FMLA for 3 months, first 2 weeks of followed by 3 days off work weekly for 3 months If patient is still unable to gain weight, recommend CT chest, abdomen and pelvis  Follow up in 3 months   Cephas Darby, MD

## 2020-03-19 ENCOUNTER — Encounter: Payer: Self-pay | Admitting: Family Medicine

## 2020-03-21 ENCOUNTER — Encounter: Payer: Self-pay | Admitting: Family Medicine

## 2020-05-18 ENCOUNTER — Ambulatory Visit: Payer: Self-pay | Admitting: Family Medicine

## 2020-05-18 DIAGNOSIS — I10 Essential (primary) hypertension: Secondary | ICD-10-CM

## 2020-05-18 DIAGNOSIS — E7800 Pure hypercholesterolemia, unspecified: Secondary | ICD-10-CM

## 2020-05-18 DIAGNOSIS — K219 Gastro-esophageal reflux disease without esophagitis: Secondary | ICD-10-CM

## 2020-05-18 DIAGNOSIS — R109 Unspecified abdominal pain: Secondary | ICD-10-CM

## 2020-05-18 NOTE — Progress Notes (Deleted)
Name: Leslie Evans   MRN: [id number]    DOB: 10/03/1955   Date:05/18/2020       Progress Note  No chief complaint on file.    Subjective:   Leslie Evans is a 65 y.o. female, presents to clinic for routine f/up  Loss of weight due to covid and GI illness dx and treated earlier this year Wt Readings from Last 5 Encounters:  03/06/20 109 lb 2 oz (49.5 kg)  02/17/20 108 lb 3.2 oz (49.1 kg)  01/31/20 113 lb 3.7 oz (51.4 kg)  01/18/20 113 lb (51.3 kg)  06/28/19 115 lb 8 oz (52.4 kg)   BMI Readings from Last 5 Encounters:  03/06/20 18.16 kg/m  02/17/20 18.01 kg/m  01/31/20 18.84 kg/m  01/18/20 18.80 kg/m  06/28/19 19.22 kg/m     ***   Current Outpatient Medications:  .  acetaminophen (TYLENOL) 500 MG tablet, Take 500 mg by mouth every 6 (six) hours as needed., Disp: , Rfl:  .  albuterol (PROVENTIL HFA) 108 (90 Base) MCG/ACT inhaler, Inhale 2 puffs into the lungs every 4 (four) hours as needed for wheezing or shortness of breath., Disp: 6.7 g, Rfl: 3 .  budesonide (ENTOCORT EC) 3 MG 24 hr capsule, Take 3 capsules (9 mg total) by mouth daily., Disp: 270 capsule, Rfl: 1 .  budesonide-formoterol (SYMBICORT) 160-4.5 MCG/ACT inhaler, Inhale 2 puffs into the lungs 2 (two) times daily., Disp: 1 Inhaler, Rfl: 11 .  citalopram (CELEXA) 40 MG tablet, Take 1 tablet (40 mg total) by mouth daily., Disp: 90 tablet, Rfl: 3 .  hydrocortisone (ANUSOL-HC) 25 MG suppository, Place 1 suppository (25 mg total) rectally 2 (two) times daily., Disp: 12 suppository, Rfl: 1 .  hydrOXYzine (VISTARIL) 25 MG capsule, Take 1-2 capsules (25-50 mg total) by mouth 2 (two) times daily as needed for anxiety (for anxiety attacks, and for insomnia)., Disp: 60 capsule, Rfl: 5 .  lisinopril (ZESTRIL) 40 MG tablet, Take 1 tablet (40 mg total) by mouth daily., Disp: 90 tablet, Rfl: 3 .  ondansetron (ZOFRAN ODT) 4 MG disintegrating tablet, Take 1-2 tablets (4-8 mg total) by mouth every 8 (eight) hours as needed  for nausea or vomiting., Disp: 30 tablet, Rfl: 1 .  pantoprazole (PROTONIX) 40 MG tablet, Take 1 tablet (40 mg total) by mouth daily., Disp: 30 tablet, Rfl: 3 .  rosuvastatin (CRESTOR) 5 MG tablet, Take 1 tablet (5 mg total) by mouth daily., Disp: 90 tablet, Rfl: 3 .  sucralfate (CARAFATE) 1 g tablet, Take 1 tablet (1 g total) by mouth 3 (three) times daily with meals as needed. for GERD/reflux/epigastric pain when eating or drinking, Disp: 30 tablet, Rfl: 1 .  tiZANidine (ZANAFLEX) 4 MG tablet, Take 0.5-1 tablets (2-4 mg total) by mouth every 8 (eight) hours as needed for muscle spasms (muscle tightness)., Disp: 30 tablet, Rfl: 1  Patient Active Problem List   Diagnosis Date Noted  . Collagenous colitis 02/17/2020  . Loss of weight   . Essential hypertension 11/27/2014  . Lumbar disc disease with radiculopathy 08/21/2014  . Chronic anxiety 08/21/2014  . COPD with asthma ([place]) 07/21/2013  . Current tobacco use 07/21/2013    Past Surgical History:  Procedure Laterality Date  . ABDOMINAL HYSTERECTOMY    . COLONOSCOPY WITH PROPOFOL N/A 01/31/2020   Procedure: COLONOSCOPY WITH PROPOFOL;  Surgeon: [name], MD;  [facility] ENDOSCOPY;  Service: Gastroenterology;  Laterality: N/A;  . ESOPHAGOGASTRODUODENOSCOPY N/A 01/31/2020   Procedure: ESOPHAGOGASTRODUODENOSCOPY (EGD);  Surgeon:  [name], MD;  Location: [facility] ENDOSCOPY;  Service: Gastroenterology;  Laterality: N/A;  . TUBAL LIGATION      Family History  Problem Relation Age of Onset  . Cancer Mother   . Diabetes Mother   . Heart disease Mother   . Cancer Father   . Drug abuse Brother   . Diabetes Maternal Grandmother   . COPD Maternal Grandfather   . Cancer Brother     Social History   Tobacco Use  . Smoking status: Current Every Day Smoker    Packs/day: 0.50    Types: Cigarettes  . Smokeless tobacco: Never Used  Vaping Use  . Vaping Use: Former  . Substances: CBD  Substance Use Topics  . Alcohol  use: Yes    Alcohol/week: 0.0 standard drinks  . Drug use: No     Allergies  Allergen Reactions  . Pregabalin Other (See Comments)  . Tetracycline Other (See Comments)  . Gabapentin Dermatitis  . Ibuprofen Nausea And Vomiting  . Lamisil [Terbinafine] Swelling    Health Maintenance  Topic Date Due  . MAMMOGRAM  09/07/2014  . COVID-19 Vaccine (3 - Booster for Pfizer series) 07/03/2020  . INFLUENZA VACCINE  08/06/2020  . TETANUS/TDAP  08/10/2022  . PAP SMEAR-Modifier  02/17/2023  . COLONOSCOPY (Pts 45-26yrs Insurance coverage will need to be confirmed)  01/30/2030  . Hepatitis C Screening  Completed  . HIV Screening  Completed  . HPV VACCINES  Aged Out    Chart Review Today: ***  Review of Systems   Objective:   There were no vitals filed for this visit.  There is no height or weight on file to calculate BMI.  Physical Exam      Assessment & Plan:   ***  No follow-ups on file.   Delsa Grana, PA-C 05/18/20 11:29 AM

## 2020-06-06 ENCOUNTER — Ambulatory Visit: Payer: Self-pay | Admitting: Gastroenterology

## 2020-06-12 ENCOUNTER — Ambulatory Visit (INDEPENDENT_AMBULATORY_CARE_PROVIDER_SITE_OTHER): Payer: Self-pay | Admitting: Gastroenterology

## 2020-06-12 ENCOUNTER — Other Ambulatory Visit: Payer: Self-pay

## 2020-06-12 ENCOUNTER — Encounter: Payer: Self-pay | Admitting: Gastroenterology

## 2020-06-12 VITALS — BP 145/74 | HR 67 | Temp 98.1°F | Ht 65.0 in | Wt 103.5 lb

## 2020-06-12 DIAGNOSIS — K529 Noninfective gastroenteritis and colitis, unspecified: Secondary | ICD-10-CM

## 2020-06-12 DIAGNOSIS — R634 Abnormal weight loss: Secondary | ICD-10-CM

## 2020-06-12 DIAGNOSIS — K52831 Collagenous colitis: Secondary | ICD-10-CM

## 2020-06-12 NOTE — Patient Instructions (Signed)
Please return stool sample as soon as you can.  Gave Zenpep samples. Take 2 capsules with the first bite of each meal and 1 capsule with the first bite of each snack

## 2020-06-12 NOTE — Progress Notes (Signed)
Cephas Darby, MD [address]  [place], [place] [address]  Main: [phone]  Fax: [phone]    Gastroenterology Consultation  Referring Provider:     Delsa Grana, PA-C Primary Care Physician:  Delsa Grana, PA-C Primary Gastroenterologist:  Dr. Cephas Darby Reason for Consultation:     Chronic diarrhea and weight loss        HPI:   Leslie Evans is a 65 y.o. female referred by Dr. Delsa Grana, PA-C  for consultation & management of chronic diarrhea and weight loss.  Patient reports more than 10 years history of nonbloody watery bowel movements, 6-8 times per day, every time she eats, she has diarrheal episode.  She also reports lower abdominal cramps associated with diarrhea.  She occasionally notices blood in her stool.  She does report nausea.  She had episodes of dehydration that resulted in dizzy spells.  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  She has also lost about 20 pounds over the last few years and her weight is now rapidly declining.  Her baseline weight is 136 pounds, today she weighs 113 pounds.  She denies lack of appetite.  She manages to drink protein supplements at work.  Her labs including CBC, CMP and TSH are unremarkable.  States she smokes half pack cigarettes per day since age of 38.  She tried Lomotil in the past which did not help.  She is recently started on Bentyl by her PCP.  Follow-up visit 03/06/2020 Patient is diagnosed with collagenous colitis which is a cause of her chronic diarrhea and weight loss.  There is no evidence of celiac disease, inflammatory bowel disease.  I started her on budesonide 3 mg 3 pills daily.  Her diarrhea has resolved.  Her stools are formed.  However, patient is struggling to gain weight.  She says, she has been eating small frequent meals.  She gained about 1 pound only.  She works as an Therapist, sports and her shift is 12 hours which is making her  very tired and not able to function well.  She is requesting if she can get an FMLA to work intermittently.  She reports that her appetite is good and would like to eat all the time.  Follow-up visit 06/12/2020 Patient is here for follow-up of collagenous colitis and weight loss.  She lost about 6 pounds since last visit.  She said she was able to gain weight when she quit her job for about a month.  She is currently working as part-time.  She is applying for Medicaid.  She is currently taking budesonide 9 mg daily, reports having 2-3 soft bowel movements.  She is not able to incorporate more calories in her diet, leads to vomiting, abdominal discomfort and sometimes diarrhea.  We tried pancreatic enzymes before her diagnosis of collagenous colitis and she said they did not seem to work so we stopped after the diagnosis of collagenous colitis is confirmed.  NSAIDs: None  Antiplts/Anticoagulants/Anti thrombotics: None  GI Procedures: Colonoscopy approximately in 2004 EGD and colonoscopy 01/31/2020  - Normal examined duodenum. Biopsied. - Erosive gastropathy with no bleeding and no stigmata of recent bleeding. Biopsied. - Normal gastric body and incisura. Biopsied. - Small hiatal hernia. - Esophagogastric landmarks identified. - Normal gastroesophageal junction and esophagus.  - Hemorrhoids found on perianal exam. - The examined portion of the ileum was normal. - Granularity in the ascending  colon and in the cecum. Biopsied. - Normal mucosa in the sigmoid colon, in the descending colon, in the transverse colon and in the distal ascending colon. Biopsied. - The rectum is normal. - Non-bleeding external hemorrhoids. - One 3 mm polyp in the descending colon, removed with a cold biopsy forceps. Resected and retrieved.  DIAGNOSIS:  A. DUODENUM, RANDOM; COLD BIOPSY:  - DUODENAL MUCOSA WITH NO SIGNIFICANT PATHOLOGIC ALTERATION.  - NEGATIVE FOR FEATURES OF CELIAC DISEASE.  - NEGATIVE FOR DYSPLASIA  AND MALIGNANCY.   B. STOMACH, RANDOM; COLD BIOPSY:  - ANTRAL AND OXYNTIC MUCOSA WITH FOCAL, MINIMAL CHRONIC INFLAMMATION.  - NEGATIVE FOR ACTIVE INFLAMMATION AND H PYLORI.  - NEGATIVE FOR INTESTINAL METAPLASIA, DYSPLASIA, AND MALIGNANCY.   C. COLON, CECUM; COLD BIOPSY:  - COLLAGENOUS COLITIS.  - NEGATIVE FOR DYSPLASIA AND MALIGNANCY.   D. COLON, RANDOM; COLD BIOPSY:  - COLLAGENOUS COLITIS.  - NEGATIVE FOR DYSPLASIA AND MALIGNANCY.   E. COLON POLYP, DESCENDING; COLD BIOPSY:  - FEATURES SUGGESTIVE OF HYPERPLASTIC POLYP.  - BACKGROUND FEATURES OF COLLAGENOUS COLITIS.  - NEGATIVE FOR DYSPLASIA AND MALIGNANCY.   Past Medical History:  Diagnosis Date  . Anxiety   . Asthma   . Cervicalgia   . COPD (chronic obstructive pulmonary disease) ([place])   . COVID-19   . H/O: hysterectomy 2004  . Hyperlipidemia     Past Surgical History:  Procedure Laterality Date  . ABDOMINAL HYSTERECTOMY    . COLONOSCOPY WITH PROPOFOL N/A 01/31/2020   Procedure: COLONOSCOPY WITH PROPOFOL;  Surgeon: [name], MD;  Location: [facility] ENDOSCOPY;  Service: Gastroenterology;  Laterality: N/A;  . ESOPHAGOGASTRODUODENOSCOPY N/A 01/31/2020   Procedure: ESOPHAGOGASTRODUODENOSCOPY (EGD);  Surgeon: [name], MD;  Location: [facility] ENDOSCOPY;  Service: Gastroenterology;  Laterality: N/A;  . TUBAL LIGATION      Current Outpatient Medications:  .  acetaminophen (TYLENOL) 500 MG tablet, Take 500 mg by mouth every 6 (six) hours as needed., Disp: , Rfl:  .  albuterol (PROVENTIL HFA) 108 (90 Base) MCG/ACT inhaler, Inhale 2 puffs into the lungs every 4 (four) hours as needed for wheezing or shortness of breath., Disp: 6.7 g, Rfl: 3 .  budesonide-formoterol (SYMBICORT) 160-4.5 MCG/ACT inhaler, Inhale 2 puffs into the lungs 2 (two) times daily., Disp: 1 Inhaler, Rfl: 11 .  citalopram (CELEXA) 40 MG tablet, Take 1 tablet (40 mg total) by mouth daily., Disp: 90 tablet, Rfl: 3 .  hydrocortisone (ANUSOL-HC) 25 MG  suppository, Place 1 suppository (25 mg total) rectally 2 (two) times daily., Disp: 12 suppository, Rfl: 1 .  hydrOXYzine (VISTARIL) 25 MG capsule, Take 1-2 capsules (25-50 mg total) by mouth 2 (two) times daily as needed for anxiety (for anxiety attacks, and for insomnia)., Disp: 60 capsule, Rfl: 5 .  lisinopril (ZESTRIL) 40 MG tablet, Take 1 tablet (40 mg total) by mouth daily., Disp: 90 tablet, Rfl: 3 .  ondansetron (ZOFRAN ODT) 4 MG disintegrating tablet, Take 1-2 tablets (4-8 mg total) by mouth every 8 (eight) hours as needed for nausea or vomiting., Disp: 30 tablet, Rfl: 1 .  pantoprazole (PROTONIX) 40 MG tablet, Take 1 tablet (40 mg total) by mouth daily., Disp: 30 tablet, Rfl: 3 .  rosuvastatin (CRESTOR) 5 MG tablet, Take 1 tablet (5 mg total) by mouth daily., Disp: 90 tablet, Rfl: 3 .  sucralfate (CARAFATE) 1 g tablet, Take 1 tablet (1 g total) by mouth 3 (three) times daily with meals as needed. for GERD/reflux/epigastric pain when eating or drinking, Disp: 30 tablet, Rfl:  1 .  tiZANidine (ZANAFLEX) 4 MG tablet, Take 0.5-1 tablets (2-4 mg total) by mouth every 8 (eight) hours as needed for muscle spasms (muscle tightness)., Disp: 30 tablet, Rfl: 1   Family History  Problem Relation Age of Onset  . Cancer Mother   . Diabetes Mother   . Heart disease Mother   . Cancer Father   . Drug abuse Brother   . Diabetes Maternal Grandmother   . COPD Maternal Grandfather   . Cancer Brother      Social History   Tobacco Use  . Smoking status: Current Every Day Smoker    Packs/day: 0.50    Types: Cigarettes  . Smokeless tobacco: Never Used  Vaping Use  . Vaping Use: Former  . Substances: CBD  Substance Use Topics  . Alcohol use: Yes    Alcohol/week: 0.0 standard drinks    Comment: occ  . Drug use: No    Allergies as of 06/12/2020 - Review Complete 06/12/2020  Allergen Reaction Noted  . Pregabalin Other (See Comments) 08/21/2014  . Tetracycline Other (See Comments) 08/21/2014  .  Gabapentin Dermatitis 08/21/2014  . Ibuprofen Nausea And Vomiting 10/09/2015  . Lamisil [terbinafine] Swelling 11/27/2014    Review of Systems:    All systems reviewed and negative except where noted in HPI.   Physical Exam:  BP (!) 145/74 (BP Location: Left Arm, Patient Position: Sitting, Cuff Size: Normal)   Pulse 67   Temp 98.1 F (36.7 C) (Oral)   Ht 5\' 5"  (1.651 m)   Wt 103 lb 8 oz (46.9 kg)   BMI 17.22 kg/m  No LMP recorded. Patient has had a hysterectomy.  General:   Alert, thin built, cachectic, pleasant and cooperative in NAD Head:  Normocephalic and atraumatic, bitemporal wasting. Eyes:  Sclera clear, no icterus.   Conjunctiva pink. Ears:  Normal auditory acuity. Nose:  No deformity, discharge, or lesions. Mouth:  No deformity or lesions,oropharynx pink & moist. Neck:  Supple; no masses or thyromegaly. Lungs:  Respirations even and unlabored.  Clear throughout to auscultation.   No wheezes, crackles, or rhonchi. No acute distress. Heart:  Regular rate and rhythm; no murmurs, clicks, rubs, or gallops. Abdomen:  Normal bowel sounds. Soft, scaphoid, nontender non-distended without masses, hepatosplenomegaly or hernias noted.  No guarding or rebound tenderness.   Rectal: Not performed Msk:  Symmetrical without gross deformities. Good, equal movement & strength bilaterally. Pulses:  Normal pulses noted. Extremities:  No clubbing or edema.  No cyanosis. Neurologic:  Alert and oriented x3;  grossly normal neurologically. Skin:  Intact without significant lesions or rashes. No jaundice. Psych:  Alert and cooperative. Normal mood and affect.  Imaging Studies: None  Assessment and Plan:   Samariya Rockhold Lippard is a 65 y.o. pleasant Caucasian female with no significant past medical history seen in consultation for chronic nonbloody diarrhea with abdominal pain and unintentional weight loss, diagnosed with collagenous colitis and started on budesonide 9 mg daily.  She also underwent  EGD with gastric, duodenal biopsies which were unremarkable.  Terminal ileum was normal.  TSH was normal.  Her diarrhea has significantly improved.  But, she continues to lose weight.   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  Patient did not do stool studies to rule out infection or to rule out pancreatic insufficiency.  Reordered stool test today I also discussed with  her about CT scan, she said she cannot afford a CT scan at this time due to financial constraints.  She is waiting for her Medicaid to be approved We will start pancreatic enzymes, samples of Zenpep 40 K capsules provided, advised to take 2 to 3 capsules every time she eats Recheck labs today Asked her to maintain weight log  Follow up in 1 month   Cephas Darby, MD

## 2020-06-13 LAB — COMPREHENSIVE METABOLIC PANEL
ALT: 23 IU/L (ref 0–32)
AST: 16 IU/L (ref 0–40)
Albumin/Globulin Ratio: 2.1 (ref 1.2–2.2)
Albumin: 4.9 g/dL — ABNORMAL HIGH (ref 3.8–4.8)
Alkaline Phosphatase: 85 IU/L (ref 44–121)
BUN/Creatinine Ratio: 11 — ABNORMAL LOW (ref 12–28)
BUN: 7 mg/dL — ABNORMAL LOW (ref 8–27)
Bilirubin Total: 0.5 mg/dL (ref 0.0–1.2)
CO2: 21 mmol/L (ref 20–29)
Calcium: 10.1 mg/dL (ref 8.7–10.3)
Chloride: 97 mmol/L (ref 96–106)
Creatinine, Ser: 0.66 mg/dL (ref 0.57–1.00)
Globulin, Total: 2.3 g/dL (ref 1.5–4.5)
Glucose: 95 mg/dL (ref 65–99)
Potassium: 4.3 mmol/L (ref 3.5–5.2)
Sodium: 135 mmol/L (ref 134–144)
Total Protein: 7.2 g/dL (ref 6.0–8.5)
eGFR: 98 mL/min/{1.73_m2} (ref >59)

## 2020-06-13 LAB — CBC
Hematocrit: 48.3 % — ABNORMAL HIGH (ref 34.0–46.6)
Hemoglobin: 16.8 g/dL — ABNORMAL HIGH (ref 11.1–15.9)
MCH: 31.3 pg (ref 26.6–33.0)
MCHC: 34.8 g/dL (ref 31.5–35.7)
MCV: 90 fL (ref 79–97)
Platelets: 386 10*3/uL (ref 150–450)
RBC: 5.37 x10E6/uL — ABNORMAL HIGH (ref 3.77–5.28)
RDW: 13.9 % (ref 11.7–15.4)
WBC: 10.4 10*3/uL (ref 3.4–10.8)

## 2020-06-13 LAB — PREALBUMIN: PREALBUMIN: 31 mg/dL (ref 10–36)

## 2020-06-15 LAB — PANCREATIC ELASTASE, FECAL: Pancreatic Elastase, Fecal: <50 ug Elast./g — ABNORMAL LOW (ref >200)

## 2020-06-19 ENCOUNTER — Telehealth: Payer: Self-pay | Admitting: Gastroenterology

## 2020-06-19 ENCOUNTER — Telehealth: Payer: Self-pay

## 2020-06-19 NOTE — Telephone Encounter (Signed)
Patient was given Zenpep patient does not have any insurance but states the medication is working. Please advised what you recommend to do. I can reach out to the drug rep and find out if there is any patient assistance program for the medication

## 2020-06-19 NOTE — Telephone Encounter (Signed)
Pancreatic enzymes are working and need more samples. Also would like Stool samples results.

## 2020-06-19 NOTE — Telephone Encounter (Signed)
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  Looks like the Zenpep samples are helping her.  Please apply for patient assistance program with Zenpep.  I will be seeing her for follow-up in July as scheduled  Also, I recommend CT abdomen and pelvis pancreas protocol to evaluate for pancreatic cancer  RV

## 2020-06-19 NOTE — Telephone Encounter (Signed)
She can pick a few more samples and please apply for patient assistance program  RV

## 2020-06-19 NOTE — Telephone Encounter (Signed)
Sent a email to [employer] to find out if there is any patient assistance

## 2020-06-20 NOTE — Telephone Encounter (Signed)
Patient verbalized understanding. She states that she would like to wait on CT scan 2 to 3 months so she can get medicare.

## 2020-06-20 NOTE — Telephone Encounter (Signed)
It is hard to associate COVID 19 infection with exocrine pancreatic insufficiency and weight loss.  Given that she has a strong history of chronic tobacco use which is one of the major risk factors for exocrine pancreatic insufficiency, I would not attribute her symptoms to COVID-19 infection  It would be her decision to defer CT abdomen until she gets Medicare  Sherri Sear, MD

## 2020-06-20 NOTE — Telephone Encounter (Signed)
Patient verbalized understanding of results. She states the Zenpep is helping her symptoms she is having more solid stools and she is not vomiting as much. She states that she wants to know if you think the COVID vaccine and booster could of cause this to her body. She has read that the covid vaccine can cause effects on your body. She states she can not afford to have the CT scan at this time. She states she does not have any insurance and does not go to talk about applying for medicare till 06/26/20

## 2020-06-20 NOTE — Telephone Encounter (Signed)
Patient will come pick up samples from our office and will bring W2 form and proof of income. She states she will also fill out her portion of the form

## 2020-06-27 ENCOUNTER — Telehealth: Payer: Self-pay

## 2020-06-27 NOTE — Telephone Encounter (Signed)
Called informed patient that she did get approved for Zenpep patient assistance. They mailed to our office 8 bottles that have 100 capsules in each bottle. Patient states she will come pick them up tomorrow

## 2020-07-04 ENCOUNTER — Other Ambulatory Visit: Payer: Self-pay | Admitting: Family Medicine

## 2020-07-04 DIAGNOSIS — F419 Anxiety disorder, unspecified: Secondary | ICD-10-CM

## 2020-07-04 DIAGNOSIS — I10 Essential (primary) hypertension: Secondary | ICD-10-CM

## 2020-07-04 DIAGNOSIS — F410 Panic disorder [episodic paroxysmal anxiety] without agoraphobia: Secondary | ICD-10-CM

## 2020-07-04 DIAGNOSIS — E7800 Pure hypercholesterolemia, unspecified: Secondary | ICD-10-CM

## 2020-07-04 NOTE — Telephone Encounter (Signed)
Called and left a detail message reminding patient to pick up the samples

## 2020-07-11 ENCOUNTER — Ambulatory Visit: Payer: Self-pay | Admitting: Gastroenterology

## 2020-07-18 ENCOUNTER — Ambulatory Visit: Payer: Self-pay | Admitting: Gastroenterology

## 2020-07-24 ENCOUNTER — Ambulatory Visit (INDEPENDENT_AMBULATORY_CARE_PROVIDER_SITE_OTHER): Payer: Self-pay | Admitting: Gastroenterology

## 2020-07-24 ENCOUNTER — Encounter: Payer: Self-pay | Admitting: Gastroenterology

## 2020-07-24 ENCOUNTER — Other Ambulatory Visit: Payer: Self-pay

## 2020-07-24 VITALS — BP 124/76 | HR 71 | Temp 98.1°F | Ht 65.0 in | Wt 107.4 lb

## 2020-07-24 DIAGNOSIS — K861 Other chronic pancreatitis: Secondary | ICD-10-CM

## 2020-07-24 DIAGNOSIS — R634 Abnormal weight loss: Secondary | ICD-10-CM

## 2020-07-24 DIAGNOSIS — K52831 Collagenous colitis: Secondary | ICD-10-CM

## 2020-07-24 DIAGNOSIS — K8681 Exocrine pancreatic insufficiency: Secondary | ICD-10-CM

## 2020-07-24 NOTE — Progress Notes (Signed)
Cephas Darby, MD [address]  [place], [place] [address]  Main: [phone]  Fax: [phone]    Gastroenterology Consultation  Referring Provider:     Delsa Grana, PA-C Primary Care Physician:  Delsa Grana, PA-C Primary Gastroenterologist:  Dr. Cephas Darby Reason for Consultation:    Severe EPI, collagenous colitis        HPI:   Leslie Evans is a 65 y.o. female referred by Dr. Delsa Grana, PA-C  for consultation & management of chronic diarrhea and weight loss.  Patient reports more than 10 years history of nonbloody watery bowel movements, 6-8 times per day, every time she eats, she has diarrheal episode.  She also reports lower abdominal cramps associated with diarrhea.  She occasionally notices blood in her stool.  She does report nausea.  She had episodes of dehydration that resulted in dizzy spells.  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  She has also lost about 20 pounds over the last few years and her weight is now rapidly declining.  Her baseline weight is 136 pounds, today she weighs 113 pounds.  She denies lack of appetite.  She manages to drink protein supplements at work.  Her labs including CBC, CMP and TSH are unremarkable.  States she smokes half pack cigarettes per day since age of 21.  She tried Lomotil in the past which did not help.  She is recently started on Bentyl by her PCP.  Follow-up visit 03/06/2020 Patient is diagnosed with collagenous colitis which is a cause of her chronic diarrhea and weight loss.  There is no evidence of celiac disease, inflammatory bowel disease.  I started her on budesonide 3 mg 3 pills daily.  Her diarrhea has resolved.  Her stools are formed.  However, patient is struggling to gain weight.  She says, she has been eating small frequent meals.  She gained about 1 pound only.  She works as an Therapist, sports and her shift is 12 hours which is making her  very tired and not able to function well.  She is requesting if she can get an FMLA to work intermittently.  She reports that her appetite is good and would like to eat all the time.  Follow-up visit 06/12/2020 Patient is here for follow-up of collagenous colitis and weight loss.  She lost about 6 pounds since last visit.  She said she was able to gain weight when she quit her job for about a month.  She is currently working as part-time.  She is applying for Medicaid.  She is currently taking budesonide 9 mg daily, reports having 2-3 soft bowel movements.  She is not able to incorporate more calories in her diet, leads to vomiting, abdominal discomfort and sometimes diarrhea.  We tried pancreatic enzymes before her diagnosis of collagenous colitis and she said they did not seem to work so we stopped after the diagnosis of collagenous colitis is confirmed.  Follow-up visit 07/24/2020 Patient is diagnosed with severe EPI and has started on pancreatic enzymes.  She is currently taking Zenpep 40,000 units 2 to 3 capsules with each meal and 1-2 with snack.  She gained 4 pounds since last visit.  Her diarrhea has resolved.  Her abdominal pain has resolved.  Patient is anxious today about the CT scan which I discussed with her during last visit in order to evaluate for pancreatic cancer.  She  is currently unemployed, looking for a job.  She will have Medicare effective from October 1st.   NSAIDs: None  Antiplts/Anticoagulants/Anti thrombotics: None  GI Procedures: Colonoscopy approximately in 2004 EGD and colonoscopy 01/31/2020  - Normal examined duodenum. Biopsied. - Erosive gastropathy with no bleeding and no stigmata of recent bleeding. Biopsied. - Normal gastric body and incisura. Biopsied. - Small hiatal hernia. - Esophagogastric landmarks identified. - Normal gastroesophageal junction and esophagus.  - Hemorrhoids found on perianal exam. - The examined portion of the ileum was normal. -  Granularity in the ascending colon and in the cecum. Biopsied. - Normal mucosa in the sigmoid colon, in the descending colon, in the transverse colon and in the distal ascending colon. Biopsied. - The rectum is normal. - Non-bleeding external hemorrhoids. - One 3 mm polyp in the descending colon, removed with a cold biopsy forceps. Resected and retrieved.  DIAGNOSIS:  A. DUODENUM, RANDOM; COLD BIOPSY:  - DUODENAL MUCOSA WITH NO SIGNIFICANT PATHOLOGIC ALTERATION.  - NEGATIVE FOR FEATURES OF CELIAC DISEASE.  - NEGATIVE FOR DYSPLASIA AND MALIGNANCY.   B. STOMACH, RANDOM; COLD BIOPSY:  - ANTRAL AND OXYNTIC MUCOSA WITH FOCAL, MINIMAL CHRONIC INFLAMMATION.  - NEGATIVE FOR ACTIVE INFLAMMATION AND H PYLORI.  - NEGATIVE FOR INTESTINAL METAPLASIA, DYSPLASIA, AND MALIGNANCY.   C. COLON, CECUM; COLD BIOPSY:  - COLLAGENOUS COLITIS.  - NEGATIVE FOR DYSPLASIA AND MALIGNANCY.   D. COLON, RANDOM; COLD BIOPSY:  - COLLAGENOUS COLITIS.  - NEGATIVE FOR DYSPLASIA AND MALIGNANCY.   E. COLON POLYP, DESCENDING; COLD BIOPSY:  - FEATURES SUGGESTIVE OF HYPERPLASTIC POLYP.  - BACKGROUND FEATURES OF COLLAGENOUS COLITIS.  - NEGATIVE FOR DYSPLASIA AND MALIGNANCY.   Past Medical History:  Diagnosis Date   Anxiety    Asthma    Cervicalgia    COPD (chronic obstructive pulmonary disease) ([place])    COVID-19    H/O: hysterectomy 2004   Hyperlipidemia     Past Surgical History:  Procedure Laterality Date   ABDOMINAL HYSTERECTOMY     COLONOSCOPY WITH PROPOFOL N/A 01/31/2020   Procedure: COLONOSCOPY WITH PROPOFOL;  Surgeon: [name], MD;  Location: [facility] ENDOSCOPY;  Service: Gastroenterology;  Laterality: N/A;   ESOPHAGOGASTRODUODENOSCOPY N/A 01/31/2020   Procedure: ESOPHAGOGASTRODUODENOSCOPY (EGD);  Surgeon: [name], MD;  [facility]: [facility] ENDOSCOPY;  Service: Gastroenterology;  Laterality: N/A;   TUBAL LIGATION      Current Outpatient Medications:    acetaminophen (TYLENOL) 500 MG  tablet, Take 500 mg by mouth every 6 (six) hours as needed., Disp: , Rfl:    albuterol (PROVENTIL HFA) 108 (90 Base) MCG/ACT inhaler, Inhale 2 puffs into the lungs every 4 (four) hours as needed for wheezing or shortness of breath., Disp: 6.7 g, Rfl: 3   budesonide-formoterol (SYMBICORT) 160-4.5 MCG/ACT inhaler, Inhale 2 puffs into the lungs 2 (two) times daily., Disp: 1 Inhaler, Rfl: 11   citalopram (CELEXA) 40 MG tablet, Take 1 tablet (40 mg total) by mouth daily., Disp: 90 tablet, Rfl: 3   hydrocortisone (ANUSOL-HC) 25 MG suppository, Place 1 suppository (25 mg total) rectally 2 (two) times daily., Disp: 12 suppository, Rfl: 1   hydrOXYzine (VISTARIL) 25 MG capsule, TAKE 1 TO 2 CAPSULES BY MOUTH TWICE DAILY AS NEEDED FOR ANXIETY (FOR  ANXIETY  ATTACKS  AND  INSOMNIA), Disp: 60 capsule, Rfl: 0   lisinopril (ZESTRIL) 40 MG tablet, Take 1 tablet by mouth once daily, Disp: 90 tablet, Rfl: 0   ondansetron (ZOFRAN ODT) 4 MG disintegrating tablet, Take 1-2 tablets (4-8 mg  total) by mouth every 8 (eight) hours as needed for nausea or vomiting., Disp: 30 tablet, Rfl: 1   Pancrelipase, Lip-Prot-Amyl, (ZENPEP) 40000-126000 units CPEP, Take by mouth., Disp: , Rfl:    pantoprazole (PROTONIX) 40 MG tablet, Take 1 tablet (40 mg total) by mouth daily., Disp: 30 tablet, Rfl: 3   rosuvastatin (CRESTOR) 5 MG tablet, Take 1 tablet by mouth once daily, Disp: 90 tablet, Rfl: 0   sucralfate (CARAFATE) 1 g tablet, Take 1 tablet (1 g total) by mouth 3 (three) times daily with meals as needed. for GERD/reflux/epigastric pain when eating or drinking, Disp: 30 tablet, Rfl: 1   tiZANidine (ZANAFLEX) 4 MG tablet, Take 0.5-1 tablets (2-4 mg total) by mouth every 8 (eight) hours as needed for muscle spasms (muscle tightness)., Disp: 30 tablet, Rfl: 1   Family History  Problem Relation Age of Onset   Cancer Mother    Diabetes Mother    Heart disease Mother    Cancer Father    Drug abuse Brother    Diabetes Maternal  Grandmother    COPD Maternal Grandfather    Cancer Brother      Social History   Tobacco Use   Smoking status: Every Day    Packs/day: 0.50    Types: Cigarettes   Smokeless tobacco: Never  Vaping Use   Vaping Use: Former   Substances: CBD  Substance Use Topics   Alcohol use: Yes    Alcohol/week: 0.0 standard drinks    Comment: occ   Drug use: No    Allergies as of 07/24/2020 - Review Complete 07/24/2020  Allergen Reaction Noted   Pregabalin Other (See Comments) 08/21/2014   Tetracycline Other (See Comments) 08/21/2014   Gabapentin Dermatitis 08/21/2014   Ibuprofen Nausea And Vomiting 10/09/2015   Lamisil [terbinafine] Swelling 11/27/2014    Review of Systems:    All systems reviewed and negative except where noted in HPI.   Physical Exam:  BP 124/76 (BP Location: Left Arm, Patient Position: Sitting, Cuff Size: Normal)   Pulse 71   Temp 98.1 F (36.7 C) (Oral)   Ht 5\' 5"  (1.651 m)   Wt 107 lb 6 oz (48.7 kg)   BMI 17.87 kg/m  No LMP recorded. Patient has had a hysterectomy.  General:   Alert, thin built, cachectic, pleasant and cooperative in NAD Head:  Normocephalic and atraumatic, bitemporal wasting. Eyes:  Sclera clear, no icterus.   Conjunctiva pink. Ears:  Normal auditory acuity. Nose:  No deformity, discharge, or lesions. Mouth:  No deformity or lesions,oropharynx pink & moist. Neck:  Supple; no masses or thyromegaly. Lungs:  Respirations even and unlabored.  Clear throughout to auscultation.   No wheezes, crackles, or rhonchi. No acute distress. Heart:  Regular rate and rhythm; no murmurs, clicks, rubs, or gallops. Abdomen:  Normal bowel sounds. Soft, scaphoid, nontender non-distended without masses, hepatosplenomegaly or hernias noted.  No guarding or rebound tenderness.   Rectal: Not performed Msk:  Symmetrical without gross deformities. Good, equal movement & strength bilaterally. Pulses:  Normal pulses noted. Extremities:  No clubbing or edema.  No  cyanosis. Neurologic:  Alert and oriented x3;  grossly normal neurologically. Skin:  Intact without significant lesions or rashes. No jaundice. Psych:  Alert and cooperative. Normal mood and affect.  Imaging Studies: None  Assessment and Plan:   Leslie Evans is a 65 y.o. pleasant Caucasian female with no significant past medical history seen in consultation for chronic nonbloody diarrhea with abdominal pain and unintentional weight  loss, diagnosed with collagenous colitis and started on budesonide 9 mg daily.  She also underwent EGD with gastric, duodenal biopsies which were unremarkable.  Terminal ileum was normal.  TSH was normal.  Her diarrhea has significantly improved.  But, she continued to lose weight.  Pancreatic fecal elastase were severely low <50  Severe EPI: Continue Zenpep 40,000 units 2 to 3 capsules with each meal and 1 to 2 capsules with snack Discussed about high-protein diet Encouraged her to maintain weight log Recommend to check vitamin A, D, E and K levels Discussed about CT abdomen and pelvis pancreas protocol, check serum CA 19-9 levels Check iron panel, B12 and folate levels Patient continues to smoke tobacco, advised her to quit  Collagenous colitis: S/p treatment with budesonide  Follow up in 3 months   Cephas Darby, MD

## 2020-07-24 NOTE — Patient Instructions (Addendum)
Your CT scan is schedule for 10/15/2020 at 10:00am and [facility] 9[facility]30am [facility]. Nothing to eat or drink for 4 hours before procedure. Please pick up the oral contrast at the medical mall  before the day before the scan. Please also call us with your insurance

## 2020-07-25 ENCOUNTER — Telehealth: Payer: Self-pay

## 2020-07-25 NOTE — Telephone Encounter (Signed)
-----   Message from [name], MD sent at 07/25/2020 10:13 AM EDT ----- Leslie Evans  Please inform patient that the tumor marker we checked for pancreatic cancer came back normal.  So, it is very less likely that she has pancreatic cancer.  This might help relieve her anxiety.  We should still go ahead and do a CT scan as scheduled  Her B12 levels are borderline low, recommend her to start taking over-the-counter B12 supplements 1000 MCG daily  Rohini Vanga

## 2020-07-25 NOTE — Telephone Encounter (Signed)
Patient verbalized understanding of the results

## 2020-08-01 ENCOUNTER — Telehealth: Payer: Self-pay

## 2020-08-01 NOTE — Telephone Encounter (Signed)
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 Recommend to start taking over-the-counter vitamin E daily for 1 month  RV

## 2020-08-01 NOTE — Telephone Encounter (Signed)
Patient verbalized understanding of results. She states that she is still fatigue but she is feeling better and is gaining weight. She states she is having trouble building her body to work 8 hours. She states she has not work since April and is wanting to find a job but she states she does not have the energy for 8 hour work day. She wants to know how long is this going to last for and when should she be getting her energy level back

## 2020-08-01 NOTE — Telephone Encounter (Signed)
Called and left a message for call back  

## 2020-08-02 NOTE — Telephone Encounter (Signed)
Fatigue will take time to get better.  She should consume more than 1500 cal/day which should include good amount of protein  If she needs to file for short-term disability for 8 weeks, I'm more than happy to fill out the paperwork  Chiyo Fay

## 2020-08-07 NOTE — Telephone Encounter (Signed)
Left patient a detail message

## 2020-08-14 LAB — CANCER ANTIGEN 19-9: CA 19-9: 8 U/mL (ref 0–35)

## 2020-08-14 LAB — IRON,TIBC AND FERRITIN PANEL
Ferritin: 96 ng/mL (ref 15–150)
Iron Saturation: 49 % (ref 15–55)
Iron: 127 ug/dL (ref 27–139)
Total Iron Binding Capacity: 259 ug/dL (ref 250–450)
UIBC: 132 ug/dL (ref 118–369)

## 2020-08-14 LAB — VITAMIN K1, SERUM: VITAMIN K1: 0.28 ng/mL (ref 0.10–2.20)

## 2020-08-14 LAB — VITAMIN D 25 HYDROXY (VIT D DEFICIENCY, FRACTURES): Vit D, 25-Hydroxy: 52.2 ng/mL (ref 30.0–100.0)

## 2020-08-14 LAB — B12 AND FOLATE PANEL
Folate: >20 ng/mL (ref >3.0)
Vitamin B-12: 300 pg/mL (ref 232–1245)

## 2020-08-14 LAB — VITAMIN A: Vitamin A: 54.2 ug/dL (ref 22.0–69.5)

## 2020-08-14 LAB — VITAMIN E
Vitamin E (Alpha Tocopherol): 7.5 mg/L — ABNORMAL LOW (ref 9.0–29.0)
Vitamin E(Gamma Tocopherol): 0.2 mg/L — ABNORMAL LOW (ref 0.5–4.9)

## 2020-09-03 ENCOUNTER — Telehealth: Payer: Self-pay

## 2020-09-03 NOTE — Telephone Encounter (Signed)
Copied from [place]. Topic: General - Other >> Sep 03, 2020  1:58 PM Yvette Rack wrote: Reason for CRM: Pt stated she still is not working and would like to know what she needs to do to apply for assistance to help her with getting the budesonide-formoterol (SYMBICORT) 160-4.5 MCG/ACT inhaler. Pt stated she can not afford to pay for the medication and she can not borrow the money because it is too much. Pt also stated she only has 2 of the Pancrelipase, Lip-Prot-Amyl, (ZENPEP) 40000-126000 units CPEP left. Pt requests call back.

## 2020-09-04 NOTE — Telephone Encounter (Signed)
Since she is uninsured, I would recommend she reach out to the [facility] to see if they can provide assistance for her Symbicort + Zenpep.  Thanks, Malva Limes, [facility] [phone]

## 2020-09-04 NOTE — Telephone Encounter (Signed)
Pt information was given.

## 2020-09-12 ENCOUNTER — Ambulatory Visit: Payer: Self-pay | Admitting: Pharmacy Technician

## 2020-09-12 ENCOUNTER — Telehealth: Payer: Self-pay

## 2020-09-12 DIAGNOSIS — K52831 Collagenous colitis: Secondary | ICD-10-CM

## 2020-09-12 DIAGNOSIS — Z79899 Other long term (current) drug therapy: Secondary | ICD-10-CM

## 2020-09-12 DIAGNOSIS — K529 Noninfective gastroenteritis and colitis, unspecified: Secondary | ICD-10-CM

## 2020-09-12 DIAGNOSIS — R1110 Vomiting, unspecified: Secondary | ICD-10-CM

## 2020-09-12 DIAGNOSIS — K219 Gastro-esophageal reflux disease without esophagitis: Secondary | ICD-10-CM

## 2020-09-12 DIAGNOSIS — E860 Dehydration: Secondary | ICD-10-CM

## 2020-09-12 DIAGNOSIS — F419 Anxiety disorder, unspecified: Secondary | ICD-10-CM

## 2020-09-12 DIAGNOSIS — R109 Unspecified abdominal pain: Secondary | ICD-10-CM

## 2020-09-12 DIAGNOSIS — F410 Panic disorder [episodic paroxysmal anxiety] without agoraphobia: Secondary | ICD-10-CM

## 2020-09-12 NOTE — Telephone Encounter (Signed)
Patient is calling because she states she got approved to get all her medications filled at [facility]. She would like all her GI medications sent there. She is requesting Dicyclomine, Zofran, Pantoprazole, Carafate tablet, Budesonide, and Zenpep.

## 2020-09-12 NOTE — Telephone Encounter (Signed)
Copied from [place] [id number]. Topic: General - Other >> Sep 12, 2020  9:46 AM Leward Quan A wrote: Reason for CRM: Patient called in to inform Delsa Grana that she is out of all medication and that the [facility] is helping her so please dont send Rxes to [facility] send to [facility]. Any question please call Ph# [phone]

## 2020-09-13 ENCOUNTER — Other Ambulatory Visit: Payer: Self-pay | Admitting: Family Medicine

## 2020-09-13 ENCOUNTER — Telehealth: Payer: Self-pay | Admitting: Family Medicine

## 2020-09-13 ENCOUNTER — Other Ambulatory Visit: Payer: Self-pay

## 2020-09-13 DIAGNOSIS — E7800 Pure hypercholesterolemia, unspecified: Secondary | ICD-10-CM

## 2020-09-13 DIAGNOSIS — I10 Essential (primary) hypertension: Secondary | ICD-10-CM

## 2020-09-13 DIAGNOSIS — F419 Anxiety disorder, unspecified: Secondary | ICD-10-CM

## 2020-09-13 DIAGNOSIS — F410 Panic disorder [episodic paroxysmal anxiety] without agoraphobia: Secondary | ICD-10-CM

## 2020-09-13 DIAGNOSIS — J449 Chronic obstructive pulmonary disease, unspecified: Secondary | ICD-10-CM

## 2020-09-13 MED ORDER — ROSUVASTATIN CALCIUM 5 MG PO TABS
5.0000 mg | ORAL_TABLET | Freq: Every day | ORAL | 0 refills | Status: DC
  Filled 2020-09-13: qty 30, 30d supply, fill #0

## 2020-09-13 MED ORDER — BUDESONIDE 3 MG PO CPEP
9.0000 mg | ORAL_CAPSULE | Freq: Every day | ORAL | 1 refills | Status: DC
  Filled 2020-09-13: qty 90, 30d supply, fill #0

## 2020-09-13 MED ORDER — ONDANSETRON 4 MG PO TBDP
4.0000 mg | ORAL_TABLET | Freq: Three times a day (TID) | ORAL | 1 refills | Status: DC | PRN
  Filled 2020-09-13: qty 30, 5d supply, fill #0

## 2020-09-13 MED ORDER — SUCRALFATE 1 G PO TABS
1.0000 g | ORAL_TABLET | Freq: Three times a day (TID) | ORAL | 1 refills | Status: DC | PRN
Start: 2020-09-13 — End: 2023-05-20
  Filled 2020-09-13: qty 30, 10d supply, fill #0

## 2020-09-13 MED ORDER — ZENPEP 40000-126000 UNITS PO CPEP
ORAL_CAPSULE | ORAL | 3 refills | Status: DC
  Filled 2020-09-13: qty 240, fill #0

## 2020-09-13 MED ORDER — HYDROXYZINE PAMOATE 25 MG PO CAPS
ORAL_CAPSULE | ORAL | 0 refills | Status: DC
  Filled 2020-09-13: qty 60, 15d supply, fill #0

## 2020-09-13 MED ORDER — PANTOPRAZOLE SODIUM 40 MG PO TBEC
40.0000 mg | DELAYED_RELEASE_TABLET | Freq: Every day | ORAL | 3 refills | Status: DC
  Filled 2020-09-13: qty 30, 30d supply, fill #0

## 2020-09-13 MED ORDER — CITALOPRAM HYDROBROMIDE 40 MG PO TABS
40.0000 mg | ORAL_TABLET | Freq: Every day | ORAL | 0 refills | Status: DC
  Filled 2020-09-13: qty 30, 30d supply, fill #0

## 2020-09-13 MED ORDER — LISINOPRIL 40 MG PO TABS
40.0000 mg | ORAL_TABLET | Freq: Every day | ORAL | 0 refills | Status: DC
  Filled 2020-09-13: qty 30, 30d supply, fill #0

## 2020-09-13 NOTE — Telephone Encounter (Signed)
Called the patient assistance company for Zenpep and they are ordering patient more Zenpep shipping to our office in 3 to 4 business days. The order number is [phone]

## 2020-09-13 NOTE — Telephone Encounter (Signed)
The number is busy

## 2020-09-13 NOTE — Telephone Encounter (Signed)
Sent medications to pharmacy

## 2020-09-13 NOTE — Progress Notes (Signed)
Patient is eligible to sign-up for Medicare A, B & D that would go into effect on 10/06/20.  Made patient aware that [facility] could not provide medication assistance after 10/06/20 because she is eligible to sign-up for Medicare.  Patient indicated that she could not afford Part B & D.  Provided patient with information as to how to be screened for a Medicare Savings Plan and L. I.S. that helps pay the premiums for both Medicare B & D.  Also mailed letter with the information.  Patient is taking generic medications that [facility] obtains from [facility].  [facility] does not allow [facility] to use these medications for patients once they turn 65 years old.  Made patient aware that [facility] has a limited generic formulary for patients 76+ years old.  [facility] may not be able to provide medication assistance for the generic medications that she takes on a regular basis.  Patient stated that she wanted to hold off collecting social security.  Wants to wait until she can receive full benefit, so that it does not impact her ability to work.  Suggested that patient contact Social Security Administration to discuss what her options are.    [place], [place]  [id number]  September 13, 2020  [place] [id number] [place], [place]  [address]  Dear Izora Gala:  Beginning October 06, 2020, you will no longer be able to receive medication assistance at [facility], because you will be eligible to sign-up for a Part "D" plan.  You may qualify for a Medicare Savings Plan, which provides assistance with your Medicare Part "A" and Part "B" health care costs, such as premiums, deductibles and coinsurance.  You may also be eligible for Low-Income Subsidy (L.I.S.), which is extra help with paying for the Medicare Part "D" plan.   You may reach out to one of the agencies listed below to obtain help with applying for a Medicare Savings Plan:  Contact the  Department of Social Services located at [address] [place], [place]  [address]; Telephone #: [phone].     Apply over the phone by calling [phone], select option #1 to speak with a [facility]) representative in [place].   If you are approved for a Medicare Savings Plan, you do not have to sign-up for Low-Income Subsidy (L.I.S.), because you will automatically qualify.     If you do not qualify for a Medicare Savings Plan, you may still qualify for Extra Help/Low-Income Subsidy (L.I.S.).  You may reach out to one of the agencies listed below to obtain assistance with applying for Extra Help/Low-Income Subsidy (L.I.S): To apply over the phone, contact [place] at [phone], or the [place] in [place] at [phone], option #1 to speak with a [facility]) representative. Apply in person by going to [employer] located at [employer], [place], [place]  [address], or online at [email or web address].  Thank you,  [facility]

## 2020-09-13 NOTE — Telephone Encounter (Signed)
Tried to call patient but number is busy will try again at a later time

## 2020-09-13 NOTE — Telephone Encounter (Signed)
Medication refills sent 3 month supply, no more refills until she is seen in the office.

## 2020-09-13 NOTE — Telephone Encounter (Signed)
Pt. Calling she says that she has filled out some paperwork for zenpep.

## 2020-09-13 NOTE — Telephone Encounter (Signed)
Tried to call patient but number is busy 

## 2020-09-13 NOTE — Telephone Encounter (Incomplete Revision)
Pt. Calling she says she would like a return call to see if Dr. Marius Ditch can help her with some samples for Zenpep. She says that she was sent paperwork for help with it but no luck.

## 2020-09-14 ENCOUNTER — Other Ambulatory Visit: Payer: Self-pay

## 2020-09-14 NOTE — Telephone Encounter (Signed)
Patient verbalized understanding she forgot that she got the patient assistance. She states she will pick it up when the medications comes in

## 2020-09-18 ENCOUNTER — Other Ambulatory Visit: Payer: Self-pay

## 2020-09-18 MED ORDER — BUDESONIDE-FORMOTEROL FUMARATE 160-4.5 MCG/ACT IN AERO
2.0000 | INHALATION_SPRAY | Freq: Two times a day (BID) | RESPIRATORY_TRACT | 0 refills | Status: DC
  Filled 2020-09-18: qty 1, fill #0
  Filled 2020-09-19: qty 10.2, 30d supply, fill #0

## 2020-09-18 NOTE — Telephone Encounter (Signed)
Pt needs f/u appt for refills 

## 2020-09-18 NOTE — Telephone Encounter (Signed)
Medication Refill - Medication: budesonide-formoterol (SYMBICORT) 160-4.5 and albuterol (PROVENTIL HFA) 108 (90 Base) MCG/ACT inhaler  Has the patient contacted their pharmacy? yes (Agent: If no, request that the patient contact the pharmacy for the refill.) (Agent: If yes, when and what did the pharmacy advise?)  Preferred Pharmacy (with phone number or street name): [facility]  Phone:  [phone] Fax:  [phone]  Agent: Please be advised that RX refills may take up to 3 business days. We ask that you follow-up with your pharmacy.

## 2020-09-18 NOTE — Telephone Encounter (Signed)
Requested medication (s) are due for refill today: expired medication  Requested medication (s) are on the active medication list: yes  Last refill:  06/28/19 1 each 11 refills   Future visit scheduled: no  Notes to clinic:  expired medication, do you want to renew Rx?     Requested Prescriptions  Pending Prescriptions Disp Refills   budesonide-formoterol (SYMBICORT) 160-4.5 MCG/ACT inhaler 1 each 11    Sig: Inhale 2 puffs into the lungs 2 (two) times daily.     Pulmonology:  Combination Products Passed - 09/18/2020 11:21 AM      Passed - Valid encounter within last 12 months    Recent Outpatient Visits           6 months ago Collagenous colitis   [facility] Delsa Grana, PA-C   7 months ago Encounter for screening mammogram for malignant neoplasm of breast   [facility] Delsa Grana, PA-C   8 months ago Dehydration   [facility] Delsa Grana, PA-C   1 year ago Essential hypertension   [facility] Delsa Grana, [place]   1 year ago Cough   [place], [place]       Future Appointments             In 1 month Vanga, Tally Due, MD [place]

## 2020-09-19 ENCOUNTER — Other Ambulatory Visit: Payer: Self-pay

## 2020-09-19 NOTE — Telephone Encounter (Signed)
Lvm for pt to call and schedule an appt  

## 2020-09-19 NOTE — Telephone Encounter (Signed)
Please close chart! It want let us close due to you having it opened with an incomplete note?  Please advise

## 2020-09-20 ENCOUNTER — Telehealth: Payer: Self-pay

## 2020-09-20 ENCOUNTER — Other Ambulatory Visit: Payer: Self-pay

## 2020-09-20 NOTE — Telephone Encounter (Signed)
Lvm for pt to call and schedule an appt  

## 2020-09-20 NOTE — Telephone Encounter (Signed)
Patient patient assistance for Zenpep came in the office for pick up. Called patient and left her a detail message that they were ready for pick up

## 2020-10-15 ENCOUNTER — Other Ambulatory Visit: Payer: Self-pay

## 2020-10-15 ENCOUNTER — Ambulatory Visit
Admission: RE | Admit: 2020-10-15 | Discharge: 2020-10-15 | Disposition: A | Discharge status: Home / Self Care | Payer: Medicare Other | Source: Ambulatory Visit | Attending: Gastroenterology | Admitting: Gastroenterology

## 2020-10-15 DIAGNOSIS — K861 Other chronic pancreatitis: Secondary | ICD-10-CM

## 2020-10-15 DIAGNOSIS — R109 Unspecified abdominal pain: Secondary | ICD-10-CM | POA: Diagnosis not present

## 2020-10-15 DIAGNOSIS — K8681 Exocrine pancreatic insufficiency: Secondary | ICD-10-CM

## 2020-10-15 HISTORY — DX: Essential (primary) hypertension: I10

## 2020-10-15 LAB — POCT I-STAT CREATININE: Creatinine, Ser: 0.6 mg/dL (ref 0.44–1.00)

## 2020-10-15 MED ORDER — IOHEXOL 350 MG/ML SOLN
80.0000 mL | Freq: Once | INTRAVENOUS | Status: AC | PRN
  Administered 2020-10-15: 75 mL via INTRAVENOUS

## 2020-10-16 ENCOUNTER — Telehealth: Payer: Self-pay

## 2020-10-16 NOTE — Telephone Encounter (Signed)
Called and patient verbalized understanding results

## 2020-10-16 NOTE — Telephone Encounter (Signed)
-----   Message from [name], MD sent at 10/16/2020  3:48 PM EDT ----- Please inform patient that the CT scan of her pancreas and rest of the abdomen came back normal  RV

## 2020-10-24 ENCOUNTER — Ambulatory Visit (INDEPENDENT_AMBULATORY_CARE_PROVIDER_SITE_OTHER): Payer: Medicare Other | Admitting: Gastroenterology

## 2020-10-24 ENCOUNTER — Encounter: Payer: Self-pay | Admitting: Gastroenterology

## 2020-10-24 ENCOUNTER — Other Ambulatory Visit: Payer: Self-pay

## 2020-10-24 VITALS — BP 116/79 | HR 81 | Temp 99.2°F | Ht 65.0 in | Wt 119.0 lb

## 2020-10-24 DIAGNOSIS — K52831 Collagenous colitis: Secondary | ICD-10-CM | POA: Diagnosis not present

## 2020-10-24 DIAGNOSIS — K641 Second degree hemorrhoids: Secondary | ICD-10-CM | POA: Diagnosis not present

## 2020-10-24 DIAGNOSIS — K8681 Exocrine pancreatic insufficiency: Secondary | ICD-10-CM | POA: Diagnosis not present

## 2020-10-24 MED ORDER — HYDROCORTISONE ACETATE 25 MG RE SUPP
25.0000 mg | Freq: Two times a day (BID) | RECTAL | 1 refills | Status: DC
Start: 2020-10-24 — End: 2020-12-14

## 2020-10-24 MED ORDER — BUDESONIDE 3 MG PO CPEP: 9.0000 mg | ORAL_CAPSULE | Freq: Every day | ORAL | 3 refills | Status: DC

## 2020-10-24 NOTE — Progress Notes (Signed)
Cephas Darby, MD [address]  [place]  [place], [place] [address]  Main: [phone]  Fax: [phone]    Gastroenterology Consultation  Referring Provider:     Delsa Grana, PA-C Primary Care Physician:  Delsa Grana, PA-C Primary Gastroenterologist:  Dr. Cephas Darby Reason for Consultation:    Severe EPI, collagenous colitis        HPI:   Leslie Evans is a 65 y.o. female referred by Dr. Delsa Grana, PA-C  for consultation & management of chronic diarrhea and weight loss.  Patient reports more than 10 years history of nonbloody watery bowel movements, 6-8 times per day, every time she eats, she has diarrheal episode.  She also reports lower abdominal cramps associated with diarrhea.  She occasionally notices blood in her stool.  She does report nausea.  She had episodes of dehydration that resulted in dizzy spells.  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  She has also lost about 20 pounds over the last few years and her weight is now rapidly declining.  Her baseline weight is 136 pounds, today she weighs 113 pounds.  She denies lack of appetite.  She manages to drink protein supplements at work.  Her labs including CBC, CMP and TSH are unremarkable.  States she smokes half pack cigarettes per day since age of 61.  She tried Lomotil in the past which did not help.  She is recently started on Bentyl by her PCP.  Follow-up visit 03/06/2020 Patient is diagnosed with collagenous colitis which is a cause of her chronic diarrhea and weight loss.  There is no evidence of celiac disease, inflammatory bowel disease.  I started her on budesonide 3 mg 3 pills daily.  Her diarrhea has resolved.  Her stools are formed.  However, patient is struggling to gain weight.  She says, she has been eating small frequent meals.  She gained about 1 pound only.  She works as an Therapist, sports and her shift is 12 hours which is making her  very tired and not able to function well.  She is requesting if she can get an FMLA to work intermittently.  She reports that her appetite is good and would like to eat all the time.  Follow-up visit 06/12/2020 Patient is here for follow-up of collagenous colitis and weight loss.  She lost about 6 pounds since last visit.  She said she was able to gain weight when she quit her job for about a month.  She is currently working as part-time.  She is applying for Medicaid.  She is currently taking budesonide 9 mg daily, reports having 2-3 soft bowel movements.  She is not able to incorporate more calories in her diet, leads to vomiting, abdominal discomfort and sometimes diarrhea.  We tried pancreatic enzymes before her diagnosis of collagenous colitis and she said they did not seem to work so we stopped after the diagnosis of collagenous colitis is confirmed.  Follow-up visit 07/24/2020 Patient is diagnosed with severe EPI and has started on pancreatic enzymes.  She is currently taking Zenpep 40,000 units 2 to 3 capsules with each meal and 1-2 with snack.  She gained 4 pounds since last visit.  Her diarrhea has resolved.  Her abdominal pain has resolved.  Patient is anxious today about the CT scan which I discussed with her during last visit in order to evaluate for pancreatic cancer.  She  is currently unemployed, looking for a job.  She will have Medicare effective from October 1st.   Follow-up visit 10/24/2020 Patient is here for follow-up of EPI and collagenous colitis.  She gained about 14 pounds since last visit.  She is trying to eat every 6 hours.  She is compliant with pancreatic enzymes as well as budesonide.  Patient tramadol refill on budesonide and notices return of diarrhea, currently having 3-4 soft bowel movements daily.  Her CT abdomen and pelvis came back unremarkable.  She is still unemployed, looking for a job.  She is taking over-the-counter vitamin E.  She continues to have hemorrhoid  symptoms  NSAIDs: None  Antiplts/Anticoagulants/Anti thrombotics: None  GI Procedures: Colonoscopy approximately in 2004 EGD and colonoscopy 01/31/2020  - Normal examined duodenum. Biopsied. - Erosive gastropathy with no bleeding and no stigmata of recent bleeding. Biopsied. - Normal gastric body and incisura. Biopsied. - Small hiatal hernia. - Esophagogastric landmarks identified. - Normal gastroesophageal junction and esophagus.  - Hemorrhoids found on perianal exam. - The examined portion of the ileum was normal. - Granularity in the ascending colon and in the cecum. Biopsied. - Normal mucosa in the sigmoid colon, in the descending colon, in the transverse colon and in the distal ascending colon. Biopsied. - The rectum is normal. - Non-bleeding external hemorrhoids. - One 3 mm polyp in the descending colon, removed with a cold biopsy forceps. Resected and retrieved.  DIAGNOSIS:  A. DUODENUM, RANDOM; COLD BIOPSY:  - DUODENAL MUCOSA WITH NO SIGNIFICANT PATHOLOGIC ALTERATION.  - NEGATIVE FOR FEATURES OF CELIAC DISEASE.  - NEGATIVE FOR DYSPLASIA AND MALIGNANCY.   B. STOMACH, RANDOM; COLD BIOPSY:  - ANTRAL AND OXYNTIC MUCOSA WITH FOCAL, MINIMAL CHRONIC INFLAMMATION.  - NEGATIVE FOR ACTIVE INFLAMMATION AND H PYLORI.  - NEGATIVE FOR INTESTINAL METAPLASIA, DYSPLASIA, AND MALIGNANCY.   C. COLON, CECUM; COLD BIOPSY:  - COLLAGENOUS COLITIS.  - NEGATIVE FOR DYSPLASIA AND MALIGNANCY.   D. COLON, RANDOM; COLD BIOPSY:  - COLLAGENOUS COLITIS.  - NEGATIVE FOR DYSPLASIA AND MALIGNANCY.   E. COLON POLYP, DESCENDING; COLD BIOPSY:  - FEATURES SUGGESTIVE OF HYPERPLASTIC POLYP.  - BACKGROUND FEATURES OF COLLAGENOUS COLITIS.  - NEGATIVE FOR DYSPLASIA AND MALIGNANCY.   Past Medical History:  Diagnosis Date   Anxiety    Asthma    Cervicalgia    COPD (chronic obstructive pulmonary disease) ([place])    COVID-19    H/O: hysterectomy 2004   Hyperlipidemia    Hypertension     Past  Surgical History:  Procedure Laterality Date   ABDOMINAL HYSTERECTOMY     COLONOSCOPY WITH PROPOFOL N/A 01/31/2020   Procedure: COLONOSCOPY WITH PROPOFOL;  Surgeon: [name], MD;  Location: [facility] ENDOSCOPY;  Service: Gastroenterology;  Laterality: N/A;   ESOPHAGOGASTRODUODENOSCOPY N/A 01/31/2020   Procedure: ESOPHAGOGASTRODUODENOSCOPY (EGD);  Surgeon: [name], MD;  Location: [facility] ENDOSCOPY;  Service: Gastroenterology;  Laterality: N/A;   TUBAL LIGATION      Current Outpatient Medications:    acetaminophen (TYLENOL) 500 MG tablet, Take 500 mg by mouth every 6 (six) hours as needed., Disp: , Rfl:    albuterol (PROVENTIL HFA) 108 (90 Base) MCG/ACT inhaler, Inhale 2 puffs into the lungs every 4 (four) hours as needed for wheezing or shortness of breath., Disp: 6.7 g, Rfl: 3   budesonide-formoterol (SYMBICORT) 160-4.5 MCG/ACT inhaler, Inhale 2 puffs into the lungs 2 (two) times daily., Disp: 10.2 g, Rfl: 0   citalopram (CELEXA) 40 MG tablet, Take 1 tablet (40 mg total) by  mouth once daily., Disp: 90 tablet, Rfl: 0   hydrOXYzine (VISTARIL) 25 MG capsule, TAKE 1 TO 2 CAPSULES BY MOUTH TWICE DAILY AS NEEDED FOR ANXIETY. (FOR  ANXIETY ATTACKS AND INSOMNIA)., Disp: 60 capsule, Rfl: 0   lisinopril (ZESTRIL) 40 MG tablet, Take 1 tablet (40 mg total) by mouth once daily., Disp: 90 tablet, Rfl: 0   ondansetron (ZOFRAN ODT) 4 MG disintegrating tablet, Dissolve 1-2 tablets (4-8 mg total) by mouth once every 8 (eight) hours as needed for nausea or vomiting., Disp: 30 tablet, Rfl: 1   Pancrelipase, Lip-Prot-Amyl, (ZENPEP) 40000-126000 units CPEP, Take 2 capsules with the first bite of each meal and 1 capsule with the first bite of each snack, Disp: 240 capsule, Rfl: 3   pantoprazole (PROTONIX) 40 MG tablet, Take 1 tablet (40 mg total) by mouth once daily., Disp: 30 tablet, Rfl: 3   rosuvastatin (CRESTOR) 5 MG tablet, Take 1 tablet (5 mg total) by mouth once daily., Disp: 90 tablet, Rfl: 0    sucralfate (CARAFATE) 1 g tablet, Take 1 tablet (1 g total) by mouth 3 (three) times daily with meals as needed for GERD/reflux/epigastric pain when eating or drinking., Disp: 30 tablet, Rfl: 1   tiZANidine (ZANAFLEX) 4 MG tablet, Take 0.5-1 tablets (2-4 mg total) by mouth every 8 (eight) hours as needed for muscle spasms (muscle tightness)., Disp: 30 tablet, Rfl: 1   budesonide (ENTOCORT EC) 3 MG 24 hr capsule, Take 3 capsules (9 mg total) by mouth once daily., Disp: 270 capsule, Rfl: 3   hydrocortisone (ANUSOL-HC) 25 MG suppository, Place 1 suppository (25 mg total) rectally 2 (two) times daily., Disp: 12 suppository, Rfl: 1   Family History  Problem Relation Age of Onset   Cancer Mother    Diabetes Mother    Heart disease Mother    Cancer Father    Drug abuse Brother    Diabetes Maternal Grandmother    COPD Maternal Grandfather    Cancer Brother      Social History   Tobacco Use   Smoking status: Every Day    Packs/day: 0.50    Types: Cigarettes   Smokeless tobacco: Never  Vaping Use   Vaping Use: Former   Substances: CBD  Substance Use Topics   Alcohol use: Yes    Alcohol/week: 0.0 standard drinks    Comment: occ   Drug use: No    Allergies as of 10/24/2020 - Review Complete 10/24/2020  Allergen Reaction Noted   Pregabalin Other (See Comments) 08/21/2014   Tetracycline Other (See Comments) 08/21/2014   Gabapentin Dermatitis 08/21/2014   Ibuprofen Nausea And Vomiting 10/09/2015   Lamisil [terbinafine] Swelling 11/27/2014    Review of Systems:    All systems reviewed and negative except where noted in HPI.   Physical Exam:  BP 116/79 (BP Location: Left Arm, Patient Position: Sitting, Cuff Size: Normal)   Pulse 81   Temp 99.2 F (37.3 C) (Oral)   Ht 5\' 5"  (1.651 m)   Wt 119 lb (54 kg)   BMI 19.80 kg/m  No LMP recorded. Patient has had a hysterectomy.  General:   Alert, t thin built pleasant and cooperative in NAD Head:  Normocephalic and atraumatic. Eyes:   Sclera clear, no icterus.   Conjunctiva pink. Ears:  Normal auditory acuity. Nose:  No deformity, discharge, or lesions. Mouth:  No deformity or lesions,oropharynx pink & moist. Neck:  Supple; no masses or thyromegaly. Lungs:  Respirations even and unlabored.  Clear throughout to auscultation.  No wheezes, crackles, or rhonchi. No acute distress. Heart:  Regular rate and rhythm; no murmurs, clicks, rubs, or gallops. Abdomen:  Normal bowel sounds. Soft, scaphoid, nontender non-distended without masses, hepatosplenomegaly or hernias noted.  No guarding or rebound tenderness.   Rectal: Large perianal skin tags, nontender digital rectal exam, liquid brown stool in the rectal vault Msk:  Symmetrical without gross deformities. Good, equal movement & strength bilaterally. Pulses:  Normal pulses noted. Extremities:  No clubbing or edema.  No cyanosis. Neurologic:  Alert and oriented x3;  grossly normal neurologically. Skin:  Intact without significant lesions or rashes. No jaundice. Psych:  Alert and cooperative. Normal mood and affect.  Imaging Studies: None  Assessment and Plan:   Leslie Evans is a 65 y.o. pleasant Caucasian female with no significant past medical history seen in consultation for chronic nonbloody diarrhea with abdominal pain and unintentional weight loss, diagnosed with collagenous colitis and started on budesonide 9 mg daily.  She also underwent EGD with gastric, duodenal biopsies which were unremarkable.  Terminal ileum was normal.  TSH was normal.  Her diarrhea has significantly improved.  But, she continued to lose weight.  Pancreatic fecal elastase were severely low <50  Severe EPI: Continue Zenpep 40,000 units 2 to 3 capsules with each meal and 1 to 2 capsules with snack Discussed about high-protein diet Encouraged her to maintain weight log I checked vitamin A, D, E and K levels, found to have vitamin D deficiency CT abdomen and pelvis pancreas protocol and CA 19-9  levels are unremarkable Iron panel, B12 and folate levels were normal Patient continues to smoke tobacco, advised her to quit Recommend bone density testing  Collagenous colitis: Continue budesonide 3 mg 3 pills daily for 1 month, once diarrhea resolved, advised her to decrease to 2 pills daily for 2 to 3 weeks, if she continues to have formed bowel movement, can decrease to 1 pill a day  Grade 2 symptomatic hemorrhoids Discussed about hemorrhoid ligation, procedure, risks and benefits Consent obtained Perform hemorrhoid ligation today  Follow up in 2 weeks   Cephas Darby, MD

## 2020-10-24 NOTE — Progress Notes (Signed)

## 2020-11-05 ENCOUNTER — Other Ambulatory Visit: Payer: Self-pay | Admitting: Family Medicine

## 2020-11-05 DIAGNOSIS — F410 Panic disorder [episodic paroxysmal anxiety] without agoraphobia: Secondary | ICD-10-CM

## 2020-11-05 DIAGNOSIS — F419 Anxiety disorder, unspecified: Secondary | ICD-10-CM

## 2020-11-05 NOTE — Telephone Encounter (Signed)
Patient is completely out please send short supply.Medication Refill - Medication hydrOXYzine (VISTARIL) 25 MG capsule   Has the patient contacted their pharmacy? yes (Agent: If no, request that the patient contact the pharmacy for the refill. If patient does not wish to contact the pharmacy document the reason why and proceed with request.) (Agent: If yes, when and what did the pharmacy advise?)contact pcp  Preferred Pharmacy (with phone number or street name):   [place], [place]  Phone:  [phone] Fax:  [phone] Has the patient been seen for an appointment in the last year OR does the patient have an upcoming appointment? yes  Agent: Please be advised that RX refills may take up to 3 business days. We ask that you follow-up with your pharmacy.

## 2020-11-06 MED ORDER — HYDROXYZINE PAMOATE 25 MG PO CAPS: ORAL_CAPSULE | ORAL | 0 refills | Status: DC

## 2020-11-06 NOTE — Telephone Encounter (Signed)
Requested medication (s) are due for refill today:   Yes  Requested medication (s) are on the active medication list:   Yes  Future visit scheduled:   Yes 11/08/2020 with Leisa   Last ordered: 09/13/2020 #60, 0 refills  Returned because it was refused 10/31 by CMA in [place].   It meets criteria for refill however due to it being refused I'm returning it for review since we got another request.   Thanks.   Requested Prescriptions  Pending Prescriptions Disp Refills   hydrOXYzine (VISTARIL) 25 MG capsule 60 capsule 0    Sig: TAKE 1 TO 2 CAPSULES BY MOUTH TWICE DAILY AS NEEDED FOR ANXIETY. (FOR  ANXIETY ATTACKS AND INSOMNIA).     Ear, Nose, and Throat:  Antihistamines Passed - 11/05/2020  5:45 PM      Passed - Valid encounter within last 12 months    Recent Outpatient Visits           8 months ago Collagenous colitis   [facility] Delsa Grana, PA-C   8 months ago Encounter for screening mammogram for malignant neoplasm of breast   [facility] Delsa Grana, PA-C   9 months ago Dehydration   [facility] Delsa Grana, PA-C   1 year ago Essential hypertension   [facility] Delsa Grana, [place]   1 year ago Cough   [place], [place], [place]       Future Appointments             Tomorrow Vanga, Tally Due, MD [place]   In 2 days Delsa Grana, PA-C [facility]

## 2020-11-07 ENCOUNTER — Ambulatory Visit (INDEPENDENT_AMBULATORY_CARE_PROVIDER_SITE_OTHER): Payer: Medicare Other | Admitting: Gastroenterology

## 2020-11-07 ENCOUNTER — Encounter: Payer: Self-pay | Admitting: Gastroenterology

## 2020-11-07 ENCOUNTER — Other Ambulatory Visit: Payer: Self-pay

## 2020-11-07 VITALS — BP 103/68 | HR 78 | Temp 98.2°F | Ht 65.0 in | Wt 121.8 lb

## 2020-11-07 DIAGNOSIS — K641 Second degree hemorrhoids: Secondary | ICD-10-CM

## 2020-11-07 NOTE — Progress Notes (Signed)

## 2020-11-08 ENCOUNTER — Ambulatory Visit: Payer: Self-pay | Admitting: Unknown Physician Specialty

## 2020-11-08 DIAGNOSIS — Z5181 Encounter for therapeutic drug level monitoring: Secondary | ICD-10-CM

## 2020-11-08 DIAGNOSIS — F410 Panic disorder [episodic paroxysmal anxiety] without agoraphobia: Secondary | ICD-10-CM

## 2020-11-08 DIAGNOSIS — J449 Chronic obstructive pulmonary disease, unspecified: Secondary | ICD-10-CM

## 2020-11-08 DIAGNOSIS — K219 Gastro-esophageal reflux disease without esophagitis: Secondary | ICD-10-CM

## 2020-11-08 DIAGNOSIS — R5383 Other fatigue: Secondary | ICD-10-CM

## 2020-11-08 DIAGNOSIS — E7800 Pure hypercholesterolemia, unspecified: Secondary | ICD-10-CM

## 2020-11-08 DIAGNOSIS — I10 Essential (primary) hypertension: Secondary | ICD-10-CM

## 2020-11-19 ENCOUNTER — Other Ambulatory Visit: Payer: Self-pay

## 2020-11-19 ENCOUNTER — Telehealth: Payer: Self-pay

## 2020-11-19 ENCOUNTER — Ambulatory Visit
Admission: RE | Admit: 2020-11-19 | Discharge: 2020-11-19 | Disposition: A | Discharge status: Home / Self Care | Payer: Medicare Other | Source: Ambulatory Visit | Attending: Gastroenterology | Admitting: Gastroenterology

## 2020-11-19 DIAGNOSIS — K8681 Exocrine pancreatic insufficiency: Secondary | ICD-10-CM | POA: Diagnosis not present

## 2020-11-19 DIAGNOSIS — Z13820 Encounter for screening for osteoporosis: Secondary | ICD-10-CM | POA: Diagnosis not present

## 2020-11-19 DIAGNOSIS — M8589 Other specified disorders of bone density and structure, multiple sites: Secondary | ICD-10-CM | POA: Insufficient documentation

## 2020-11-19 DIAGNOSIS — Z780 Asymptomatic menopausal state: Secondary | ICD-10-CM | POA: Diagnosis not present

## 2020-11-19 NOTE — Telephone Encounter (Signed)
Called patient gave her the results and let her know to follow up with pcp

## 2020-11-19 NOTE — Telephone Encounter (Signed)
-----   Message from [name], MD sent at 11/19/2020  4:08 PM EST ----- Hope  Please inform patient that she has osteoporosis.  Her vitamin D levels are normal.  She should follow-up with PCP who will refer her to an endocrinologist for further management of osteoporosis  Rohini Vanga

## 2020-11-20 ENCOUNTER — Telehealth: Payer: Self-pay

## 2020-11-20 ENCOUNTER — Encounter: Payer: Self-pay | Admitting: Gastroenterology

## 2020-11-20 NOTE — Telephone Encounter (Signed)
error 

## 2020-11-21 ENCOUNTER — Encounter: Payer: Self-pay | Admitting: Gastroenterology

## 2020-11-21 ENCOUNTER — Ambulatory Visit (INDEPENDENT_AMBULATORY_CARE_PROVIDER_SITE_OTHER): Payer: Medicare Other | Admitting: Gastroenterology

## 2020-11-21 VITALS — BP 137/82 | HR 73 | Temp 99.0°F | Ht 65.0 in | Wt 124.0 lb

## 2020-11-21 DIAGNOSIS — K641 Second degree hemorrhoids: Secondary | ICD-10-CM

## 2020-11-21 NOTE — Progress Notes (Signed)
PROCEDURE NOTE: The patient presents with symptomatic grade 2 hemorrhoids, unresponsive to maximal medical therapy, requesting rubber band ligation of his/her hemorrhoidal disease.  All risks, benefits and alternative forms of therapy were described and informed consent was obtained.  The decision was made to band the RA internal hemorrhoid, and the [facility]'Regan System was used to perform band ligation without complication.  Digital anorectal examination was then performed to assure proper positioning of the band, and to adjust the banded tissue as required.  The patient was discharged home without pain or other issues.  Dietary and behavioral recommendations were given and (if necessary - prescriptions were given), along with follow-up instructions.  The patient will return as needed for follow-up and possible additional banding as required.  No complications were encountered and the patient tolerated the procedure well.   

## 2020-12-12 ENCOUNTER — Other Ambulatory Visit: Payer: Self-pay | Admitting: Family Medicine

## 2020-12-12 DIAGNOSIS — I10 Essential (primary) hypertension: Secondary | ICD-10-CM

## 2020-12-14 ENCOUNTER — Other Ambulatory Visit: Payer: Self-pay

## 2020-12-14 ENCOUNTER — Other Ambulatory Visit: Payer: Self-pay | Admitting: Nurse Practitioner

## 2020-12-14 ENCOUNTER — Ambulatory Visit (INDEPENDENT_AMBULATORY_CARE_PROVIDER_SITE_OTHER): Payer: Medicare Other | Admitting: Nurse Practitioner

## 2020-12-14 ENCOUNTER — Encounter: Payer: Self-pay | Admitting: Nurse Practitioner

## 2020-12-14 VITALS — BP 130/80 | HR 86 | Temp 98.2°F | Resp 18 | Ht 65.0 in | Wt 124.0 lb

## 2020-12-14 DIAGNOSIS — I10 Essential (primary) hypertension: Secondary | ICD-10-CM | POA: Diagnosis not present

## 2020-12-14 DIAGNOSIS — E7800 Pure hypercholesterolemia, unspecified: Secondary | ICD-10-CM

## 2020-12-14 DIAGNOSIS — M5416 Radiculopathy, lumbar region: Secondary | ICD-10-CM

## 2020-12-14 DIAGNOSIS — J449 Chronic obstructive pulmonary disease, unspecified: Secondary | ICD-10-CM

## 2020-12-14 DIAGNOSIS — F419 Anxiety disorder, unspecified: Secondary | ICD-10-CM

## 2020-12-14 DIAGNOSIS — K52831 Collagenous colitis: Secondary | ICD-10-CM

## 2020-12-14 DIAGNOSIS — F410 Panic disorder [episodic paroxysmal anxiety] without agoraphobia: Secondary | ICD-10-CM

## 2020-12-14 MED ORDER — HYDROXYZINE PAMOATE 25 MG PO CAPS: ORAL_CAPSULE | ORAL | 1 refills | Status: DC

## 2020-12-14 MED ORDER — ROSUVASTATIN CALCIUM 5 MG PO TABS
5.0000 mg | ORAL_TABLET | Freq: Every day | ORAL | 3 refills | Status: DC
Start: 2020-12-14 — End: 2022-01-02

## 2020-12-14 MED ORDER — BUDESONIDE 3 MG PO CPEP: 9.0000 mg | ORAL_CAPSULE | Freq: Every day | ORAL | 3 refills | Status: DC

## 2020-12-14 MED ORDER — BUDESONIDE-FORMOTEROL FUMARATE 160-4.5 MCG/ACT IN AERO: 2.0000 | INHALATION_SPRAY | Freq: Two times a day (BID) | RESPIRATORY_TRACT | 3 refills | Status: DC

## 2020-12-14 MED ORDER — CITALOPRAM HYDROBROMIDE 40 MG PO TABS: 40.0000 mg | ORAL_TABLET | Freq: Every day | ORAL | 3 refills | Status: DC

## 2020-12-14 MED ORDER — LISINOPRIL 40 MG PO TABS
40.0000 mg | ORAL_TABLET | Freq: Every day | ORAL | 3 refills | Status: DC
Start: 2020-12-14 — End: 2021-12-17

## 2020-12-14 MED ORDER — ALBUTEROL SULFATE HFA 108 (90 BASE) MCG/ACT IN AERS: 2.0000 | INHALATION_SPRAY | RESPIRATORY_TRACT | 3 refills | Status: DC | PRN

## 2020-12-14 MED ORDER — TIZANIDINE HCL 4 MG PO TABS: 2.0000 mg | ORAL_TABLET | Freq: Three times a day (TID) | ORAL | 1 refills | Status: DC | PRN

## 2020-12-14 NOTE — Progress Notes (Signed)
BP 130/80   Pulse 86   Temp 98.2 F (36.8 C) (Oral)   Resp 18   Ht 5\' 5"  (1.651 m)   Wt 124 lb (56.2 kg)   SpO2 98%   BMI 20.63 kg/m    Subjective:    Patient ID: Leslie Evans, female    DOB: 15-Jun-1955, 65 y.o.   MRN: [id number]  HPI: Leslie Evans is a 65 y.o. female  Chief Complaint  Patient presents with   Hypertension   Hyperlipidemia   Medication Problem   Hypertension:  She says she has been out of her blood pressure medication for about a week.  She takes lisinopril 40 mg.  Blood pressure today is 130/80. She says it has been normal at home.  She denies any chest pain, shortness of breath, headaches or blurred vision.    Hyperlipidemia: She currently takes rosuvastatin 5 mg daily.  She says she has leg jerks but no myalgia.  Her last LDL was 89.  Will recheck labs today.   The 10-year ASCVD risk score (Arnett DK, et al., 2019) is: 12.4%   Values used to calculate the score:     Age: 70 years     Sex: Female     Is Non-Hispanic African American: No     Diabetic: No     Tobacco smoker: Yes     Systolic Blood Pressure: 557 mmHg     Is BP treated: Yes     HDL Cholesterol: 56 mg/dL     Total Cholesterol: 162 mg/dL   COPD:  she says she is doing well with her current treatments.  She is on albuterol prn and Symbicort. She continues to smoke and does not want to quit at this time.   Anxiety: She is currently taking celexa 40 mg and hydroxyzine for anxiety. She says this is working well.  She had a complication getting her hydroxyzine due to her insurance saying that she need to apply for an exception.  Request has been placed.   GAD 7 : Generalized Anxiety Score 12/14/2020 02/29/2020 02/17/2020 01/17/2020  Nervous, Anxious, on Edge 0 2 1 1   Control/stop worrying 0 2 1 0  Worry too much - different things 0 2 0 1  Trouble relaxing 0 2 1 1   Restless 0 0 0 0  Easily annoyed or irritable 0 0 0 0  Afraid - awful might happen 0 0 0 0  Total GAD 7 Score 0 8 3 3    Anxiety Difficulty Not difficult at all Not difficult at all Not difficult at all Not difficult at all     Depression screen [facility] 2/9 12/14/2020 02/29/2020 02/17/2020 02/17/2020 01/17/2020  Decreased Interest 0 1 0 0 0  Down, Depressed, Hopeless 0 1 1 1  0  PHQ - 2 Score 0 2 1 1  0  Altered sleeping 0 0 3 - 1  Tired, decreased energy 0 2 2 - 0  Change in appetite 0 1 2 - 1  Feeling bad or failure about yourself  0 0 0 - 0  Trouble concentrating 0 1 0 - 0  Moving slowly or fidgety/restless 0 0 0 - 0  Suicidal thoughts 0 0 0 - 0  PHQ-9 Score 0 6 8 - 2  Difficult doing work/chores Not difficult at all Somewhat difficult Not difficult at all - Not difficult at all  Some recent data might be hidden    Lumbar radiculopathy: She says she uses her tizanidine prn when  her back is bothering her.  She says this current treatment is working for her.  Discussed non medication treatments to help, proper body mechanics, and heat therapy.  Colitis:  She continues to take budesonide 9 mg daily, she says she is doing well and has not been having any problems of late. She says her diarrhea has improved.  She is still having episodes but doing much better. Dr. Marius Ditch is her GI last seen on 11/21/20  Relevant past medical, surgical, family and social history reviewed and updated as indicated. Interim medical history since our last visit reviewed. Allergies and medications reviewed and updated.  Review of Systems  Constitutional: Negative for fever or weight change.  Respiratory: Negative for cough and shortness of breath.   Cardiovascular: Negative for chest pain or palpitations.  Gastrointestinal: Negative for abdominal pain, diarrhea Musculoskeletal: Negative for gait problem or joint swelling.  Skin: Negative for rash.  Neurological: Negative for dizziness or headache.  No other specific complaints in a complete review of systems (except as listed in HPI above).      Objective:    BP 130/80   Pulse 86    Temp 98.2 F (36.8 C) (Oral)   Resp 18   Ht 5\' 5"  (1.651 m)   Wt 124 lb (56.2 kg)   SpO2 98%   BMI 20.63 kg/m   Wt Readings from Last 3 Encounters:  12/14/20 124 lb (56.2 kg)  11/21/20 124 lb (56.2 kg)  11/07/20 121 lb 12.8 oz (55.2 kg)    Physical Exam  Constitutional: Patient appears well-developed and well-nourished. No distress.  HEENT: head atraumatic, normocephalic, pupils equal and reactive to light, neck supple Cardiovascular: Normal rate, regular rhythm and normal heart sounds.  No murmur heard. No BLE edema. Pulmonary/Chest: Effort normal and breath sounds normal. No respiratory distress. Abdominal: Soft.  There is no tenderness. Psychiatric: Patient has a normal mood and affect. behavior is normal. Judgment and thought content normal.   Results for orders placed or performed during the hospital encounter of 10/15/20  I-STAT creatinine  Result Value Ref Range   Creatinine, Ser 0.60 0.44 - 1.00 mg/dL      Assessment & Plan:   1. Essential hypertension  - lisinopril (ZESTRIL) 40 MG tablet; Take 1 tablet (40 mg total) by mouth once daily.  Dispense: 90 tablet; Refill: 3 - CBC with Differential/Platelet - COMPLETE METABOLIC PANEL WITH GFR  2. Pure hypercholesterolemia  - rosuvastatin (CRESTOR) 5 MG tablet; Take 1 tablet (5 mg total) by mouth once daily.  Dispense: 90 tablet; Refill: 3 - Lipid panel  3. COPD with asthma ([place])  - albuterol (PROVENTIL HFA) 108 (90 Base) MCG/ACT inhaler; Inhale 2 puffs into the lungs every 4 (four) hours as needed for wheezing or shortness of breath.  Dispense: 6.7 g; Refill: 3 - budesonide-formoterol (SYMBICORT) 160-4.5 MCG/ACT inhaler; Inhale 2 puffs into the lungs 2 (two) times daily.  Dispense: 10.2 g; Refill: 3  4. Chronic anxiety  - citalopram (CELEXA) 40 MG tablet; Take 1 tablet (40 mg total) by mouth once daily.  Dispense: 90 tablet; Refill: 3 - hydrOXYzine (VISTARIL) 25 MG capsule; TAKE 1 TO 2 CAPSULES BY MOUTH TWICE  DAILY AS NEEDED FOR ANXIETY. (FOR  ANXIETY ATTACKS AND INSOMNIA).  Dispense: 60 capsule; Refill: 1  5. Panic attacks  - hydrOXYzine (VISTARIL) 25 MG capsule; TAKE 1 TO 2 CAPSULES BY MOUTH TWICE DAILY AS NEEDED FOR ANXIETY. (FOR  ANXIETY ATTACKS AND INSOMNIA).  Dispense: 60 capsule; Refill: 1  6. Lumbar radiculopathy  - tiZANidine (ZANAFLEX) 4 MG tablet; Take 0.5-1 tablets (2-4 mg total) by mouth every 8 (eight) hours as needed for muscle spasms (muscle tightness).  Dispense: 30 tablet; Refill: 1  7. Collagenous colitis  - budesonide (ENTOCORT EC) 3 MG 24 hr capsule; Take 3 capsules (9 mg total) by mouth once daily.  Dispense: 270 capsule; Refill: 3     Follow up plan: Return in about 6 months (around 06/14/2021) for follow up.

## 2020-12-15 LAB — CBC WITH DIFFERENTIAL/PLATELET
Absolute Monocytes: 474 cells/uL (ref 200–950)
Basophils Absolute: 79 cells/uL (ref 0–200)
Basophils Relative: 1 %
Eosinophils Absolute: 340 cells/uL (ref 15–500)
Eosinophils Relative: 4.3 %
HCT: 47.7 % — ABNORMAL HIGH (ref 35.0–45.0)
Hemoglobin: 16.5 g/dL — ABNORMAL HIGH (ref 11.7–15.5)
Lymphs Abs: 2781 cells/uL (ref 850–3900)
MCH: 31.9 pg (ref 27.0–33.0)
MCHC: 34.6 g/dL (ref 32.0–36.0)
MCV: 92.1 fL (ref 80.0–100.0)
MPV: 11.2 fL (ref 7.5–12.5)
Monocytes Relative: 6 %
Neutro Abs: 4227 cells/uL (ref 1500–7800)
Neutrophils Relative %: 53.5 %
Platelets: 223 10*3/uL (ref 140–400)
RBC: 5.18 10*6/uL — ABNORMAL HIGH (ref 3.80–5.10)
RDW: 12.4 % (ref 11.0–15.0)
Total Lymphocyte: 35.2 %
WBC: 7.9 10*3/uL (ref 3.8–10.8)

## 2020-12-15 LAB — LIPID PANEL
Cholesterol: 163 mg/dL (ref <200)
HDL: 51 mg/dL (ref >=50)
LDL Cholesterol (Calc): 88 mg/dL (calc)
Non-HDL Cholesterol (Calc): 112 mg/dL (calc) (ref <130)
Total CHOL/HDL Ratio: 3.2 (calc) (ref <5.0)
Triglycerides: 139 mg/dL (ref <150)

## 2020-12-15 LAB — COMPLETE METABOLIC PANEL WITH GFR
AG Ratio: 1.8 (calc) (ref 1.0–2.5)
ALT: 13 U/L (ref 6–29)
AST: 13 U/L (ref 10–35)
Albumin: 4.2 g/dL (ref 3.6–5.1)
Alkaline phosphatase (APISO): 67 U/L (ref 37–153)
BUN: 11 mg/dL (ref 7–25)
CO2: 28 mmol/L (ref 20–32)
Calcium: 10.1 mg/dL (ref 8.6–10.4)
Chloride: 106 mmol/L (ref 98–110)
Creat: 0.66 mg/dL (ref 0.50–1.05)
Globulin: 2.4 g/dL (calc) (ref 1.9–3.7)
Glucose, Bld: 80 mg/dL (ref 65–99)
Potassium: 4.3 mmol/L (ref 3.5–5.3)
Sodium: 141 mmol/L (ref 135–146)
Total Bilirubin: 0.6 mg/dL (ref 0.2–1.2)
Total Protein: 6.6 g/dL (ref 6.1–8.1)
eGFR: 97 mL/min/{1.73_m2} (ref >=60)

## 2021-02-25 ENCOUNTER — Telehealth: Payer: Self-pay | Admitting: Family Medicine

## 2021-02-25 ENCOUNTER — Other Ambulatory Visit: Payer: Self-pay | Admitting: Nurse Practitioner

## 2021-02-25 DIAGNOSIS — F410 Panic disorder [episodic paroxysmal anxiety] without agoraphobia: Secondary | ICD-10-CM

## 2021-02-25 DIAGNOSIS — F419 Anxiety disorder, unspecified: Secondary | ICD-10-CM

## 2021-02-25 NOTE — Telephone Encounter (Signed)
Copied from [place] [id number]. Topic: Quick Communication - Rx Refill/Question >> Feb 25, 2021 10:19 AM Tessa Lerner A wrote: Medication: hydrOXYzine (VISTARIL) 25 MG capsule [[id number]] - patient has zero capsules remaining   Has the patient contacted their pharmacy? Yes.  The patient has been directed to contact their PCP. The patient has stressed the urgency of their request  (Agent: If no, request that the patient contact the pharmacy for the refill. If patient does not wish to contact the pharmacy document the reason why and proceed with request.) (Agent: If yes, when and what did the pharmacy advise?)  Preferred Pharmacy (with phone number or street name): [place], [place] - [address] [place] [address] Phone: [phone] Fax: [phone] Hours: Not open 24 hours   Has the patient been seen for an appointment in the last year OR does the patient have an upcoming appointment? Yes.    Agent: Please be advised that RX refills may take up to 3 business days. We ask that you follow-up with your pharmacy.

## 2021-02-26 NOTE — Telephone Encounter (Signed)
Requested Prescriptions  Pending Prescriptions Disp Refills   hydrOXYzine (VISTARIL) 25 MG capsule [Pharmacy Med Name: hydrOXYzine Pamoate 25 MG Oral Capsule] 120 capsule 1    Sig: TAKE 1 TO 2 CAPSULES BY MOUTH TWICE DAILY AS NEEDED FOR INSOMNIA FOR ANXIETY     Ear, Nose, and Throat:  Antihistamines 2 Passed - 02/25/2021 10:16 AM      Passed - Cr in normal range and within 360 days    Creat  Date Value Ref Range Status  12/14/2020 0.66 0.50 - 1.05 mg/dL Final         Passed - Valid encounter within last 12 months    Recent Outpatient Visits          2 months ago Essential hypertension   [place], FNP   12 months ago Collagenous colitis   [facility] Delsa Grana, PA-C   1 year ago Encounter for screening mammogram for malignant neoplasm of breast   [facility] Delsa Grana, PA-C   1 year ago Dehydration   [facility] Delsa Grana, PA-C   1 year ago Essential hypertension   [facility] Delsa Grana, [place]

## 2021-02-26 NOTE — Telephone Encounter (Signed)
Duplicate request.  Requested Prescriptions  Pending Prescriptions Disp Refills   hydrOXYzine (VISTARIL) 25 MG capsule 60 capsule 1    Sig: TAKE 1 TO 2 CAPSULES BY MOUTH TWICE DAILY AS NEEDED FOR ANXIETY. (FOR  ANXIETY ATTACKS AND INSOMNIA).     Ear, Nose, and Throat:  Antihistamines 2 Passed - 02/25/2021 11:32 AM      Passed - Cr in normal range and within 360 days    Creat  Date Value Ref Range Status  12/14/2020 0.66 0.50 - 1.05 mg/dL Final         Passed - Valid encounter within last 12 months    Recent Outpatient Visits          2 months ago Essential hypertension   [place], FNP   12 months ago Collagenous colitis   [facility] Delsa Grana, PA-C   1 year ago Encounter for screening mammogram for malignant neoplasm of breast   [facility] Delsa Grana, PA-C   1 year ago Dehydration   [facility] Delsa Grana, PA-C   1 year ago Essential hypertension   [facility] Delsa Grana, [place]

## 2021-04-15 ENCOUNTER — Other Ambulatory Visit: Payer: Self-pay

## 2021-04-15 DIAGNOSIS — Z1231 Encounter for screening mammogram for malignant neoplasm of breast: Secondary | ICD-10-CM

## 2021-04-20 ENCOUNTER — Other Ambulatory Visit: Payer: Self-pay | Admitting: Family Medicine

## 2021-04-20 DIAGNOSIS — K219 Gastro-esophageal reflux disease without esophagitis: Secondary | ICD-10-CM

## 2021-04-20 DIAGNOSIS — R109 Unspecified abdominal pain: Secondary | ICD-10-CM

## 2021-04-25 ENCOUNTER — Other Ambulatory Visit: Payer: Self-pay | Admitting: Family Medicine

## 2021-04-25 DIAGNOSIS — K529 Noninfective gastroenteritis and colitis, unspecified: Secondary | ICD-10-CM

## 2021-04-25 DIAGNOSIS — E860 Dehydration: Secondary | ICD-10-CM

## 2021-04-25 DIAGNOSIS — R1110 Vomiting, unspecified: Secondary | ICD-10-CM

## 2021-04-25 DIAGNOSIS — R109 Unspecified abdominal pain: Secondary | ICD-10-CM

## 2021-05-04 ENCOUNTER — Other Ambulatory Visit: Payer: Self-pay | Admitting: Family Medicine

## 2021-05-04 DIAGNOSIS — K529 Noninfective gastroenteritis and colitis, unspecified: Secondary | ICD-10-CM

## 2021-05-04 DIAGNOSIS — R1110 Vomiting, unspecified: Secondary | ICD-10-CM

## 2021-05-04 DIAGNOSIS — E860 Dehydration: Secondary | ICD-10-CM

## 2021-05-04 DIAGNOSIS — R109 Unspecified abdominal pain: Secondary | ICD-10-CM

## 2021-05-08 ENCOUNTER — Ambulatory Visit: Payer: Self-pay

## 2021-05-08 NOTE — Telephone Encounter (Signed)
Patient called in stated that she requested a refill on her ondansetron (ZOFRAN ODT) 4 MG disintegrating tablet last week with no response,  because she had been having nausea and vomited several times. Asking for her refill ASAP please can be reached at Ph# [phone]  ? ? ?Left message to call back about symptoms. ?

## 2021-05-08 NOTE — Telephone Encounter (Signed)
2nd attempt, Patient called, left VM to return the call to the office to discuss medication with a nurse.  

## 2021-05-08 NOTE — Telephone Encounter (Signed)
Last prescribed by Dr. Marius Ditch. Refills need to go to him or schedule a follow up here to address it/concerns. Hasn't seen Kristeen Miss since Feb 2022 ?

## 2021-05-08 NOTE — Telephone Encounter (Signed)
3rd attempt, Patient called, left VM to return the call to the office to discuss medication with a nurse. Will route to practice per protocol.  ? ?Patient called in stated that she requested a refill on her ondansetron (ZOFRAN ODT) 4 MG disintegrating tablet last week with no response,  because she had been having nausea and vomited several times. Asking for her refill ASAP please can be reached at Ph# [phone]  ?

## 2021-05-30 ENCOUNTER — Telehealth: Payer: Self-pay

## 2021-05-30 NOTE — Telephone Encounter (Signed)
Submitted PA on Budesonide '3mg'$  on cover my meds

## 2021-05-31 ENCOUNTER — Other Ambulatory Visit: Payer: Self-pay | Admitting: Family Medicine

## 2021-05-31 DIAGNOSIS — K219 Gastro-esophageal reflux disease without esophagitis: Secondary | ICD-10-CM

## 2021-05-31 DIAGNOSIS — R109 Unspecified abdominal pain: Secondary | ICD-10-CM

## 2021-05-31 NOTE — Telephone Encounter (Signed)
Pt called in and is requesting a 90 day supply of this Rx.

## 2021-06-20 ENCOUNTER — Other Ambulatory Visit: Payer: Self-pay | Admitting: Family Medicine

## 2021-06-20 DIAGNOSIS — K219 Gastro-esophageal reflux disease without esophagitis: Secondary | ICD-10-CM

## 2021-06-20 DIAGNOSIS — R109 Unspecified abdominal pain: Secondary | ICD-10-CM

## 2021-06-27 ENCOUNTER — Encounter: Payer: Self-pay | Admitting: Gastroenterology

## 2021-06-27 ENCOUNTER — Ambulatory Visit (INDEPENDENT_AMBULATORY_CARE_PROVIDER_SITE_OTHER): Payer: Medicare Other | Admitting: Gastroenterology

## 2021-06-27 VITALS — BP 131/85 | HR 64 | Temp 98.4°F | Ht 65.0 in | Wt 130.5 lb

## 2021-06-27 DIAGNOSIS — K529 Noninfective gastroenteritis and colitis, unspecified: Secondary | ICD-10-CM | POA: Diagnosis not present

## 2021-06-27 DIAGNOSIS — K8681 Exocrine pancreatic insufficiency: Secondary | ICD-10-CM | POA: Diagnosis not present

## 2021-06-27 DIAGNOSIS — E860 Dehydration: Secondary | ICD-10-CM | POA: Diagnosis not present

## 2021-06-27 DIAGNOSIS — K52831 Collagenous colitis: Secondary | ICD-10-CM | POA: Diagnosis not present

## 2021-06-27 DIAGNOSIS — R110 Nausea: Secondary | ICD-10-CM | POA: Diagnosis not present

## 2021-06-27 MED ORDER — BUDESONIDE 3 MG PO CPEP: 9.0000 mg | ORAL_CAPSULE | Freq: Every day | ORAL | 1 refills | Status: DC

## 2021-06-27 MED ORDER — ZENPEP 40000-126000 UNITS PO CPEP: ORAL_CAPSULE | ORAL | 5 refills | Status: DC

## 2021-06-27 MED ORDER — ONDANSETRON 4 MG PO TBDP: 4.0000 mg | ORAL_TABLET | Freq: Three times a day (TID) | ORAL | 1 refills | Status: DC | PRN

## 2021-06-27 NOTE — Progress Notes (Signed)
Leslie Darby, MD [address]  [place]  [place], [place] [address]  Main: [phone]  Fax: [phone]    Gastroenterology Consultation  Referring Provider:     Delsa Grana, PA-C Primary Care Physician:  Delsa Grana, PA-C Primary Gastroenterologist:  Dr. Cephas Evans Reason for Consultation:    Severe EPI, collagenous colitis        HPI:   Leslie Evans is a 66 y.o. female referred by Dr. Delsa Grana, PA-C  for consultation & management of chronic diarrhea and weight loss.  Patient reports more than 10 years history of nonbloody watery bowel movements, 6-8 times per day, every time she eats, she has diarrheal episode.  She also reports lower abdominal cramps associated with diarrhea.  She occasionally notices blood in her stool.  She does report nausea.  She had episodes of dehydration that resulted in dizzy spells.  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  She has also lost about 20 pounds over the last few years and her weight is now rapidly declining.  Her baseline weight is 136 pounds, today she weighs 113 pounds.  She denies lack of appetite.  She manages to drink protein supplements at work.  Her labs including CBC, CMP and TSH are unremarkable.  States she smokes half pack cigarettes per day since age of 61.  She tried Lomotil in the past which did not help.  She is recently started on Bentyl by her PCP.  Follow-up visit 03/06/2020 Patient is diagnosed with collagenous colitis which is a cause of her chronic diarrhea and weight loss.  There is no evidence of celiac disease, inflammatory bowel disease.  I started her on budesonide 3 mg 3 pills daily.  Her diarrhea has resolved.  Her stools are formed.  However, patient is struggling to gain weight.  She says, she has been eating small frequent meals.  She gained about 1 pound only.  She works as an Therapist, sports and her shift is 12 hours which is making her  very tired and not able to function well.  She is requesting if she can get an FMLA to work intermittently.  She reports that her appetite is good and would like to eat all the time.  Follow-up visit 06/12/2020 Patient is here for follow-up of collagenous colitis and weight loss.  She lost about 6 pounds since last visit.  She said she was able to gain weight when she quit her job for about a month.  She is currently working as part-time.  She is applying for Medicaid.  She is currently taking budesonide 9 mg daily, reports having 2-3 soft bowel movements.  She is not able to incorporate more calories in her diet, leads to vomiting, abdominal discomfort and sometimes diarrhea.  We tried pancreatic enzymes before her diagnosis of collagenous colitis and she said they did not seem to work so we stopped after the diagnosis of collagenous colitis is confirmed.  Follow-up visit 07/24/2020 Patient is diagnosed with severe EPI and has started on pancreatic enzymes.  She is currently taking Zenpep 40,000 units 2 to 3 capsules with each meal and 1-2 with snack.  She gained 4 pounds since last visit.  Her diarrhea has resolved.  Her abdominal pain has resolved.  Patient is anxious today about the CT scan which I discussed with her during last visit in order to evaluate for pancreatic cancer.  She  is currently unemployed, looking for a job.  She will have Medicare effective from October 1st.   Follow-up visit 10/24/2020 Patient is here for follow-up of EPI and collagenous colitis.  She gained about 14 pounds since last visit.  She is trying to eat every 6 hours.  She is compliant with pancreatic enzymes as well as budesonide.  Patient tramadol refill on budesonide and notices return of diarrhea, currently having 3-4 soft bowel movements daily.  Her CT abdomen and pelvis came back unremarkable.  She is still unemployed, looking for a job.  She is taking over-the-counter vitamin E.  She continues to have hemorrhoid  symptoms  Follow-up visit 06/27/2021 Patient is here for follow-up of EPI and collagenous colitis.  Patient continues to gain weight, she gained 5 to 8 pounds since last visit.  She is working as a Marine scientist at [employer], [employer] on surgery floor.  She continues to take Zenpep 3 capsules with each meal and 1 with snack.  She is taking budesonide 6 mg daily.  She is in good spirits today.  She does report episodes of loose stools, worse postprandial associated with especially when she has high calorie drinks.  She does not have any concerns today.  She took vitamin E Supplements.  She had bone density testing which revealed osteopenia.  Patient reports intermittent nausea for which she takes Zofran as needed  NSAIDs: None  Antiplts/Anticoagulants/Anti thrombotics: None  GI Procedures: Colonoscopy approximately in 2004 EGD and colonoscopy 01/31/2020  - Normal examined duodenum. Biopsied. - Erosive gastropathy with no bleeding and no stigmata of recent bleeding. Biopsied. - Normal gastric body and incisura. Biopsied. - Small hiatal hernia. - Esophagogastric landmarks identified. - Normal gastroesophageal junction and esophagus.  - Hemorrhoids found on perianal exam. - The examined portion of the ileum was normal. - Granularity in the ascending colon and in the cecum. Biopsied. - Normal mucosa in the sigmoid colon, in the descending colon, in the transverse colon and in the distal ascending colon. Biopsied. - The rectum is normal. - Non-bleeding external hemorrhoids. - One 3 mm polyp in the descending colon, removed with a cold biopsy forceps. Resected and retrieved.  DIAGNOSIS:  A. DUODENUM, RANDOM; COLD BIOPSY:  - DUODENAL MUCOSA WITH NO SIGNIFICANT PATHOLOGIC ALTERATION.  - NEGATIVE FOR FEATURES OF CELIAC DISEASE.  - NEGATIVE FOR DYSPLASIA AND MALIGNANCY.   B. STOMACH, RANDOM; COLD BIOPSY:  - ANTRAL AND OXYNTIC MUCOSA WITH FOCAL, MINIMAL CHRONIC INFLAMMATION.  - NEGATIVE  FOR ACTIVE INFLAMMATION AND H PYLORI.  - NEGATIVE FOR INTESTINAL METAPLASIA, DYSPLASIA, AND MALIGNANCY.   C. COLON, CECUM; COLD BIOPSY:  - COLLAGENOUS COLITIS.  - NEGATIVE FOR DYSPLASIA AND MALIGNANCY.   D. COLON, RANDOM; COLD BIOPSY:  - COLLAGENOUS COLITIS.  - NEGATIVE FOR DYSPLASIA AND MALIGNANCY.   E. COLON POLYP, DESCENDING; COLD BIOPSY:  - FEATURES SUGGESTIVE OF HYPERPLASTIC POLYP.  - BACKGROUND FEATURES OF COLLAGENOUS COLITIS.  - NEGATIVE FOR DYSPLASIA AND MALIGNANCY.   Past Medical History:  Diagnosis Date   Anxiety    Asthma    Cervicalgia    COPD (chronic obstructive pulmonary disease) ([place])    COVID-19    H/O: hysterectomy 2004   Hyperlipidemia    Hypertension     Past Surgical History:  Procedure Laterality Date   ABDOMINAL HYSTERECTOMY     COLONOSCOPY WITH PROPOFOL N/A 01/31/2020   Procedure: COLONOSCOPY WITH PROPOFOL;  Surgeon: [name], MD;  Location: [facility] ENDOSCOPY;  Service: Gastroenterology;  Laterality: N/A;  ESOPHAGOGASTRODUODENOSCOPY N/A 01/31/2020   Procedure: ESOPHAGOGASTRODUODENOSCOPY (EGD);  Surgeon: [name], MD;  Location: [facility] ENDOSCOPY;  Service: Gastroenterology;  Laterality: N/A;   TUBAL LIGATION      Current Outpatient Medications:    acetaminophen (TYLENOL) 500 MG tablet, Take 500 mg by mouth every 6 (six) hours as needed., Disp: , Rfl:    albuterol (PROVENTIL HFA) 108 (90 Base) MCG/ACT inhaler, Inhale 2 puffs into the lungs every 4 (four) hours as needed for wheezing or shortness of breath., Disp: 6.7 g, Rfl: 3   budesonide-formoterol (SYMBICORT) 160-4.5 MCG/ACT inhaler, Inhale 2 puffs into the lungs 2 (two) times daily., Disp: 10.2 g, Rfl: 3   citalopram (CELEXA) 40 MG tablet, Take 1 tablet (40 mg total) by mouth once daily., Disp: 90 tablet, Rfl: 3   hydrOXYzine (VISTARIL) 25 MG capsule, TAKE 1 TO 2 CAPSULES BY MOUTH TWICE DAILY AS NEEDED FOR INSOMNIA FOR ANXIETY, Disp: 120 capsule, Rfl: 1   lisinopril (ZESTRIL) 40 MG  tablet, Take 1 tablet (40 mg total) by mouth once daily., Disp: 90 tablet, Rfl: 3   pantoprazole (PROTONIX) 40 MG tablet, Take 1 tablet by mouth once daily, Disp: 14 tablet, Rfl: 0   rosuvastatin (CRESTOR) 5 MG tablet, Take 1 tablet (5 mg total) by mouth once daily., Disp: 90 tablet, Rfl: 3   sucralfate (CARAFATE) 1 g tablet, Take 1 tablet (1 g total) by mouth 3 (three) times daily with meals as needed for GERD/reflux/epigastric pain when eating or drinking., Disp: 30 tablet, Rfl: 1   tiZANidine (ZANAFLEX) 4 MG tablet, Take 0.5-1 tablets (2-4 mg total) by mouth every 8 (eight) hours as needed for muscle spasms (muscle tightness)., Disp: 30 tablet, Rfl: 1   budesonide (ENTOCORT EC) 3 MG 24 hr capsule, Take 3 capsules (9 mg total) by mouth once daily., Disp: 270 capsule, Rfl: 1   ondansetron (ZOFRAN ODT) 4 MG disintegrating tablet, Dissolve 1-2 tablets (4-8 mg total) by mouth once every 8 (eight) hours as needed for nausea or vomiting., Disp: 90 tablet, Rfl: 1   Pancrelipase, Lip-Prot-Amyl, (ZENPEP) 40000-126000 units CPEP, Take 2 capsules with the first bite of each meal and 1 capsule with the first bite of each snack, Disp: 240 capsule, Rfl: 5   Family History  Problem Relation Age of Onset   Cancer Mother    Diabetes Mother    Heart disease Mother    Cancer Father    Drug abuse Brother    Diabetes Maternal Grandmother    COPD Maternal Grandfather    Cancer Brother      Social History   Tobacco Use   Smoking status: Every Day    Packs/day: 0.50    Types: Cigarettes   Smokeless tobacco: Never  Vaping Use   Vaping Use: Former   Substances: CBD  Substance Use Topics   Alcohol use: Yes    Alcohol/week: 0.0 standard drinks of alcohol    Comment: occ   Drug use: No    Allergies as of 06/27/2021 - Review Complete 06/27/2021  Allergen Reaction Noted   Pregabalin Other (See Comments) 08/21/2014   Tetracycline Other (See Comments) 08/21/2014   Gabapentin Dermatitis 08/21/2014    Ibuprofen Nausea And Vomiting 10/09/2015   Lamisil [terbinafine] Swelling 11/27/2014    Review of Systems:    All systems reviewed and negative except where noted in HPI.   Physical Exam:  BP 131/85 (BP Location: Left Arm, Patient Position: Sitting, Cuff Size: Normal)   Pulse 64   Temp  98.4 F (36.9 C) (Oral)   Ht '5\' 5"'$  (1.651 m)   Wt 130 lb 8 oz (59.2 kg)   BMI 21.72 kg/m  No LMP recorded. Patient has had a hysterectomy.  General:   Alert, t thin built pleasant and cooperative in NAD Head:  Normocephalic and atraumatic. Eyes:  Sclera clear, no icterus.   Conjunctiva pink. Ears:  Normal auditory acuity. Nose:  No deformity, discharge, or lesions. Mouth:  No deformity or lesions,oropharynx pink & moist. Neck:  Supple; no masses or thyromegaly. Lungs:  Respirations even and unlabored.  Clear throughout to auscultation.   No wheezes, crackles, or rhonchi. No acute distress. Heart:  Regular rate and rhythm; no murmurs, clicks, rubs, or gallops. Abdomen:  Normal bowel sounds. Soft, scaphoid, nontender non-distended without masses, hepatosplenomegaly or hernias noted.  No guarding or rebound tenderness.   Rectal: Large perianal skin tags, nontender digital rectal exam, liquid brown stool in the rectal vault Msk:  Symmetrical without gross deformities. Good, equal movement & strength bilaterally. Pulses:  Normal pulses noted. Extremities:  No clubbing or edema.  No cyanosis. Neurologic:  Alert and oriented x3;  grossly normal neurologically. Skin:  Intact without significant lesions or rashes. No jaundice. Psych:  Alert and cooperative. Normal mood and affect.  Imaging Studies: None  Assessment and Plan:   Era Parr Vandunk is a 66 y.o. pleasant Caucasian female with no significant past medical history seen in consultation for chronic nonbloody diarrhea with abdominal pain and unintentional weight loss, diagnosed with collagenous colitis and started on budesonide 9 mg daily.  She also  underwent EGD with gastric, duodenal biopsies which were unremarkable.  Terminal ileum was normal.  TSH was normal.  Her diarrhea has significantly improved.  She continues to gain weight.  Pancreatic fecal elastase were severely low <50  Severe EPI: Continue Zenpep 40,000 units 2 to 3 capsules with each meal and 1 to 2 capsules with snack Discussed about high-protein diet Encouraged her to maintain weight log I checked vitamin A, D, E and K levels, found to have vitamin E deficiency, s/p replacement Recheck vitamin E levels today CT abdomen and pelvis pancreas protocol and CA 19-9 levels are unremarkable Iron panel, B12 and folate levels were normal Patient continues to smoke tobacco, advised her to quit  Osteopenia Check vitamin D levels, have been normal recently  Collagenous colitis: Continue budesonide 3 mg 3 pills daily for 1 month, once diarrhea resolved, advised her to decrease to 2 pills daily for 2 to 3 weeks, if she continues to have formed bowel movement, can decrease to 1 pill a day  Grade 2 symptomatic hemorrhoids S/p hemorrhoid ligation end of 2022  Follow up in 6 months   Leslie Darby, MD

## 2021-06-30 LAB — VITAMIN E
Vitamin E (Alpha Tocopherol): 8.8 mg/L — ABNORMAL LOW (ref 9.0–29.0)
Vitamin E(Gamma Tocopherol): 0.5 mg/L (ref 0.5–4.9)

## 2021-06-30 LAB — VITAMIN D 25 HYDROXY (VIT D DEFICIENCY, FRACTURES): Vit D, 25-Hydroxy: 35.6 ng/mL (ref 30.0–100.0)

## 2021-07-02 ENCOUNTER — Telehealth: Payer: Self-pay

## 2021-09-03 ENCOUNTER — Telehealth: Payer: Self-pay | Admitting: Gastroenterology

## 2021-09-03 ENCOUNTER — Ambulatory Visit: Payer: Self-pay

## 2021-09-03 DIAGNOSIS — K52831 Collagenous colitis: Secondary | ICD-10-CM

## 2021-09-03 DIAGNOSIS — R197 Diarrhea, unspecified: Secondary | ICD-10-CM

## 2021-09-03 NOTE — Telephone Encounter (Signed)
Order labs. Patient will go to [facility] or [facility] on [address] tomorrow to have the labs and stool kit done.

## 2021-09-03 NOTE — Telephone Encounter (Signed)
Patient has follow up appointment on 12/25/2021 and she states she can not wait that long to be seen. She states last Monday 08/26/2021 she called out of work because she was feeling bad. She states she has got worse since then. She is having abdominal pain in the center right below belly button. She is having diarrhea off and on with some form stool. She has had diarrhea 2 times today. She is having nausea all the time and is vomiting 2 to 3 times a day. She is taking the Zofran '4mg'$ . She states she is able to keep liquids down if she drinks it slowly. She states the last weight she took she was 132 before she got sick today she was 121 with pajamas and a robe. She states she is very weak and not able to do much. She states she will get up and do a little bit and sit down and rest.,

## 2021-09-03 NOTE — Telephone Encounter (Signed)
  Chief Complaint: Vomiting and diarrhea - weight loss Symptoms: ibid Frequency: Ongoing Pertinent Negatives: Patient denies  Disposition: '[]'$ ED /'[]'$ Urgent Care (no appt availability in office) / '[]'$ Appointment(In office/virtual)/ '[]'$  [facility]/ '[]'$ Home Care/ '[]'$ Refused Recommended Disposition /'[]'$ [facility]/ '[x]'$  Follow-up with PCP Additional Notes: Pt has had vomiting and diarrhea for 1 week. Pt has hx of ulcerative colitis. Pt believes that this is what is happening right now. Pt has call into Dr. Marius Ditch and would like to talk to her regarding s/s. Pt will call back if she does not hear from Dr. Verlin Grills office. Or pt will go to UC or ED if needed.    Summary: GI issues   Pt has ben having vomiting and diarrhea for about a week.  She does have a GI doctor and doesn't know whether she needs to see him or someone at [place].   CB@  [phone]      Answer Assessment - Initial Assessment Questions 1. REASON FOR CALL or QUESTION: "What is your reason for calling today?" or "How can I best help you?" or "What question do you have that I can help answer?"     Pt will wait to hear back form Dr. Verlin Grills office  Protocols used: Information Only Call - No Triage-A-AH

## 2021-09-03 NOTE — Telephone Encounter (Signed)
Pt has an appt with Almyra Free for 09/04/21 also waiting to hear from her GI dr

## 2021-09-03 NOTE — Telephone Encounter (Signed)
Recommend GI profile PCR to rule out any infection, check CBC and CMP, magnesium and phosphorus levels  RV

## 2021-09-03 NOTE — Telephone Encounter (Signed)
Patient states she has been out of work the last week and that she is taking her medication right. States she is exhausted and losing weight and weak.

## 2021-09-03 NOTE — Telephone Encounter (Signed)
Summary: GI issues   Pt has ben having vomiting and diarrhea for about a week.  She does have a GI doctor and doesn't know whether she needs to see him or someone at [place].   CB@  [phone]      Called pt [id number]

## 2021-09-03 NOTE — Addendum Note (Signed)
Addended by: Ulyess Blossom L on: 09/03/2021 04:34 PM   Modules accepted: Orders

## 2021-09-04 ENCOUNTER — Other Ambulatory Visit: Payer: Self-pay

## 2021-09-04 ENCOUNTER — Ambulatory Visit (INDEPENDENT_AMBULATORY_CARE_PROVIDER_SITE_OTHER): Payer: Medicare Other | Admitting: Nurse Practitioner

## 2021-09-04 ENCOUNTER — Encounter: Payer: Self-pay | Admitting: Nurse Practitioner

## 2021-09-04 VITALS — BP 118/72 | HR 93 | Temp 98.3°F | Resp 18 | Ht 65.0 in | Wt 122.2 lb

## 2021-09-04 DIAGNOSIS — R112 Nausea with vomiting, unspecified: Secondary | ICD-10-CM

## 2021-09-04 DIAGNOSIS — R197 Diarrhea, unspecified: Secondary | ICD-10-CM

## 2021-09-04 MED ORDER — DICYCLOMINE HCL 20 MG PO TABS: 20.0000 mg | ORAL_TABLET | Freq: Three times a day (TID) | ORAL | 0 refills | Status: DC | PRN

## 2021-09-04 MED ORDER — METOCLOPRAMIDE HCL 10 MG PO TABS: 10.0000 mg | ORAL_TABLET | Freq: Four times a day (QID) | ORAL | 0 refills | Status: DC | PRN

## 2021-09-04 NOTE — Progress Notes (Signed)
BP 118/72   Pulse 93   Temp 98.3 F (36.8 C) (Oral)   Resp 18   Ht '5\' 5"'$  (1.651 m)   Wt 122 lb 3.2 oz (55.4 kg)   SpO2 97%   BMI 20.34 kg/m    Subjective:    Patient ID: Leslie Evans, female    DOB: [birth year], 66 y.o.   MRN: [id number]  HPI: Leslie Evans is a 66 y.o. female  Chief Complaint  Patient presents with   Diarrhea   Emesis    For 1 week   Vomiting and diarrhea: patient reports she has had vomiting and diarrhea since September 21.  She denies any fever.Patient states she does have abdominal cramping.  She says she has a hard time keeping anything down.  She says she is able to drink small amounts of water.  She says she has tried to eat chicken noodle soup and some saltine crackers.  She says she is really not able to hold anything down.  Patient orts she has had multiple episodes of diarrhea.  Discussed that she likely needs to go the emergency room for IV fluids.  Patient is declining that at this time.  Discussed again that if symptoms worsen she needs to go to the emergency room.  Patient verbalized understanding.  She states she had similar symptoms about a year ago.  Has been in contact with GI.  Her GI doctor is Dr. Marius Ditch.  Did order blood work and stool cultures.  Patient would like to get that done in the office today.  Orders placed.  She states she has taken Zofran to help with her nausea.  But has not helped.  We will send in Reglan.  Patient also reports she has missed a lot of work and will need a work note.  We will get labs, stool culture.  Also recommend she follow-up with GI.  Relevant past medical, surgical, family and social history reviewed and updated as indicated. Interim medical history since our last visit reviewed. Allergies and medications reviewed and updated.  Review of Systems  Constitutional: Negative for fever or weight change.  Respiratory: Negative for cough and shortness of breath.   Cardiovascular: Negative for chest pain or  palpitations.  Gastrointestinal: positive for abdominal pain, diarrhea, vomiting Musculoskeletal: Negative for gait problem or joint swelling.  Skin: Negative for rash.  Neurological: Negative for dizziness or headache.  No other specific complaints in a complete review of systems (except as listed in HPI above).      Objective:    BP 118/72   Pulse 93   Temp 98.3 F (36.8 C) (Oral)   Resp 18   Ht '5\' 5"'$  (1.651 m)   Wt 122 lb 3.2 oz (55.4 kg)   SpO2 97%   BMI 20.34 kg/m   Wt Readings from Last 3 Encounters:  09/04/21 122 lb 3.2 oz (55.4 kg)  06/27/21 130 lb 8 oz (59.2 kg)  12/14/20 124 lb (56.2 kg)    Physical Exam  Constitutional: Patient appears well-developed and well-nourished. No distress.  HEENT: head atraumatic, normocephalic, pupils equal and reactive to light, neck supple Cardiovascular: Normal rate, regular rhythm and normal heart sounds.  No murmur heard. No BLE edema. Pulmonary/Chest: Effort normal and breath sounds normal. No respiratory distress. Abdominal: Soft.  There is no tenderness. Psychiatric: Patient has a normal mood and affect. behavior is normal. Judgment and thought content normal.  Results for orders placed or performed in visit on 06/27/21  Vitamin E  Result Value Ref Range   Vitamin E (Alpha Tocopherol) 8.8 (L) 9.0 - 29.0 mg/L   Vitamin E(Gamma Tocopherol) 0.5 0.5 - 4.9 mg/L  Vitamin D (25 hydroxy)  Result Value Ref Range   Vit D, 25-Hydroxy 35.6 30.0 - 100.0 ng/mL      Assessment & Plan:   Problem List Items Addressed This Visit   None Visit Diagnoses     Nausea and vomiting, unspecified vomiting type    -  Primary   We will send in Reglan.  We will also get labs and stool culture.  Recommend following up with GI.  Symptoms worsen go to ER   Relevant Medications   metoCLOPramide (REGLAN) 10 MG tablet   dicyclomine (BENTYL) 20 MG tablet   Other Relevant Orders   CBC with Differential/Platelet   COMPLETE METABOLIC PANEL WITH GFR    Magnesium   Phosphorus   Lipase   Amylase   Diarrhea, unspecified type       We will send in Reglan.  We will also get labs and stool culture.  Recommend following up with GI.  Symptoms worsen go to ER   Relevant Orders   CBC with Differential/Platelet   COMPLETE METABOLIC PANEL WITH GFR   Magnesium   Phosphorus   Stool Giardia/Cryptosporidium   Clostridium difficile culture-fecal   Salmonella/Shigella Cult, [place] EIA and Shiga Toxin reflex   Ova and parasite examination        Follow up plan: Return if symptoms worsen or fail to improve.

## 2021-09-05 LAB — COMPLETE METABOLIC PANEL WITH GFR
AG Ratio: 1.6 (calc) (ref 1.0–2.5)
ALT: 6 U/L (ref 6–29)
AST: 7 U/L — ABNORMAL LOW (ref 10–35)
Albumin: 3.8 g/dL (ref 3.6–5.1)
Alkaline phosphatase (APISO): 73 U/L (ref 37–153)
BUN: 7 mg/dL (ref 7–25)
CO2: 24 mmol/L (ref 20–32)
Calcium: 9.7 mg/dL (ref 8.6–10.4)
Chloride: 107 mmol/L (ref 98–110)
Creat: 0.66 mg/dL (ref 0.50–1.05)
Globulin: 2.4 g/dL (calc) (ref 1.9–3.7)
Glucose, Bld: 87 mg/dL (ref 65–99)
Potassium: 3.8 mmol/L (ref 3.5–5.3)
Sodium: 140 mmol/L (ref 135–146)
Total Bilirubin: 0.5 mg/dL (ref 0.2–1.2)
Total Protein: 6.2 g/dL (ref 6.1–8.1)
eGFR: 97 mL/min/{1.73_m2} (ref >=60)

## 2021-09-05 LAB — CBC WITH DIFFERENTIAL/PLATELET
Absolute Monocytes: 752 cells/uL (ref 200–950)
Basophils Absolute: 69 cells/uL (ref 0–200)
Basophils Relative: 0.7 %
Eosinophils Absolute: 198 cells/uL (ref 15–500)
Eosinophils Relative: 2 %
HCT: 43.9 % (ref 35.0–45.0)
Hemoglobin: 15.4 g/dL (ref 11.7–15.5)
Lymphs Abs: 2614 cells/uL (ref 850–3900)
MCH: 31.4 pg (ref 27.0–33.0)
MCHC: 35.1 g/dL (ref 32.0–36.0)
MCV: 89.6 fL (ref 80.0–100.0)
MPV: 10.5 fL (ref 7.5–12.5)
Monocytes Relative: 7.6 %
Neutro Abs: 6267 cells/uL (ref 1500–7800)
Neutrophils Relative %: 63.3 %
Platelets: 327 10*3/uL (ref 140–400)
RBC: 4.9 10*6/uL (ref 3.80–5.10)
RDW: 12.3 % (ref 11.0–15.0)
Total Lymphocyte: 26.4 %
WBC: 9.9 10*3/uL (ref 3.8–10.8)

## 2021-09-05 LAB — AMYLASE: Amylase: 38 U/L (ref 21–101)

## 2021-09-05 LAB — PHOSPHORUS: Phosphorus: 3.1 mg/dL (ref 2.1–4.3)

## 2021-09-05 LAB — LIPASE: Lipase: 26 U/L (ref 7–60)

## 2021-09-05 LAB — MAGNESIUM: Magnesium: 2 mg/dL (ref 1.5–2.5)

## 2021-09-09 ENCOUNTER — Emergency Department (HOSPITAL_COMMUNITY)
Admission: EM | Admit: 2021-09-09 | Discharge: 2021-09-09 | Disposition: A | Discharge status: Home / Self Care | Payer: Medicare Other | Attending: Emergency Medicine | Admitting: Emergency Medicine

## 2021-09-09 ENCOUNTER — Emergency Department (HOSPITAL_COMMUNITY): Payer: Medicare Other

## 2021-09-09 ENCOUNTER — Other Ambulatory Visit: Payer: Self-pay

## 2021-09-09 ENCOUNTER — Encounter (HOSPITAL_COMMUNITY): Payer: Self-pay | Admitting: Emergency Medicine

## 2021-09-09 DIAGNOSIS — R110 Nausea: Secondary | ICD-10-CM

## 2021-09-09 DIAGNOSIS — R918 Other nonspecific abnormal finding of lung field: Secondary | ICD-10-CM | POA: Diagnosis not present

## 2021-09-09 DIAGNOSIS — E860 Dehydration: Secondary | ICD-10-CM | POA: Diagnosis not present

## 2021-09-09 DIAGNOSIS — R109 Unspecified abdominal pain: Secondary | ICD-10-CM | POA: Diagnosis not present

## 2021-09-09 DIAGNOSIS — R197 Diarrhea, unspecified: Secondary | ICD-10-CM | POA: Insufficient documentation

## 2021-09-09 DIAGNOSIS — J449 Chronic obstructive pulmonary disease, unspecified: Secondary | ICD-10-CM | POA: Diagnosis not present

## 2021-09-09 DIAGNOSIS — R112 Nausea with vomiting, unspecified: Secondary | ICD-10-CM | POA: Diagnosis not present

## 2021-09-09 DIAGNOSIS — Z79899 Other long term (current) drug therapy: Secondary | ICD-10-CM | POA: Diagnosis not present

## 2021-09-09 DIAGNOSIS — I10 Essential (primary) hypertension: Secondary | ICD-10-CM | POA: Insufficient documentation

## 2021-09-09 DIAGNOSIS — K219 Gastro-esophageal reflux disease without esophagitis: Secondary | ICD-10-CM

## 2021-09-09 LAB — URINALYSIS, ROUTINE W REFLEX MICROSCOPIC
Bilirubin Urine: NEGATIVE
Glucose, UA: NEGATIVE mg/dL
Hgb urine dipstick: NEGATIVE
Ketones, ur: NEGATIVE mg/dL
Leukocytes,Ua: NEGATIVE
Nitrite: NEGATIVE
Protein, ur: NEGATIVE mg/dL
Specific Gravity, Urine: 1.003 — ABNORMAL LOW (ref 1.005–1.030)
pH: 6 (ref 5.0–8.0)

## 2021-09-09 LAB — COMPREHENSIVE METABOLIC PANEL
ALT: 10 U/L (ref 0–44)
AST: 13 U/L — ABNORMAL LOW (ref 15–41)
Albumin: 3.2 g/dL — ABNORMAL LOW (ref 3.5–5.0)
Alkaline Phosphatase: 71 U/L (ref 38–126)
Anion gap: 8 (ref 5–15)
BUN: 10 mg/dL (ref 8–23)
CO2: 23 mmol/L (ref 22–32)
Calcium: 9.8 mg/dL (ref 8.9–10.3)
Chloride: 106 mmol/L (ref 98–111)
Creatinine, Ser: 0.76 mg/dL (ref 0.44–1.00)
GFR, Estimated: >60 mL/min (ref >60)
Glucose, Bld: 114 mg/dL — ABNORMAL HIGH (ref 70–99)
Potassium: 3.7 mmol/L (ref 3.5–5.1)
Sodium: 137 mmol/L (ref 135–145)
Total Bilirubin: 0.4 mg/dL (ref 0.3–1.2)
Total Protein: 5.9 g/dL — ABNORMAL LOW (ref 6.5–8.1)

## 2021-09-09 LAB — LIPASE, BLOOD: Lipase: 44 U/L (ref 11–51)

## 2021-09-09 LAB — CBC
HCT: 43.6 % (ref 36.0–46.0)
Hemoglobin: 14.9 g/dL (ref 12.0–15.0)
MCH: 31.1 pg (ref 26.0–34.0)
MCHC: 34.2 g/dL (ref 30.0–36.0)
MCV: 91 fL (ref 80.0–100.0)
Platelets: 284 10*3/uL (ref 150–400)
RBC: 4.79 MIL/uL (ref 3.87–5.11)
RDW: 12.4 % (ref 11.5–15.5)
WBC: 11.5 10*3/uL — ABNORMAL HIGH (ref 4.0–10.5)
nRBC: 0 % (ref 0.0–0.2)

## 2021-09-09 MED ORDER — ONDANSETRON 4 MG PO TBDP
4.0000 mg | ORAL_TABLET | Freq: Once | ORAL | Status: AC | PRN
  Administered 2021-09-09: 4 mg via ORAL
  Filled 2021-09-09: qty 1

## 2021-09-09 MED ORDER — ONDANSETRON 4 MG PO TBDP: 4.0000 mg | ORAL_TABLET | Freq: Three times a day (TID) | ORAL | 0 refills | Status: DC | PRN

## 2021-09-09 MED ORDER — SODIUM CHLORIDE 0.9 % IV BOLUS
1000.0000 mL | Freq: Once | INTRAVENOUS | Status: AC
  Administered 2021-09-09: 1000 mL via INTRAVENOUS

## 2021-09-09 MED ORDER — PANTOPRAZOLE SODIUM 40 MG PO TBEC: 40.0000 mg | DELAYED_RELEASE_TABLET | Freq: Every day | ORAL | 0 refills | Status: DC

## 2021-09-09 MED ORDER — SODIUM CHLORIDE 0.9 % IV BOLUS
1000.0000 mL | Freq: Once | INTRAVENOUS | Status: AC
Start: 2021-09-09 — End: 2021-09-09
  Administered 2021-09-09: 1000 mL via INTRAVENOUS

## 2021-09-09 MED ORDER — SODIUM CHLORIDE 0.9 % IV SOLN: INTRAVENOUS | Status: DC

## 2021-09-09 MED ORDER — PANTOPRAZOLE SODIUM 40 MG IV SOLR
40.0000 mg | Freq: Once | INTRAVENOUS | Status: AC
  Administered 2021-09-09: 40 mg via INTRAVENOUS
  Filled 2021-09-09: qty 10

## 2021-09-09 NOTE — Discharge Instructions (Signed)
Take the medications as prescribed.  Follow-up with your primary care doctor to be rechecked if the symptoms are not improving.  Return as needed for worsening symptoms

## 2021-09-09 NOTE — ED Provider Notes (Signed)
[facility] EMERGENCY DEPARTMENT Provider Note   CSN: [id number] Arrival date & time: 09/09/21  8315     History  Chief Complaint  Patient presents with   Emesis    Leslie Evans is a 66 y.o. female.   Emesis Patient has history of COPD, hypertension, collagenous colitis Pt states Leslie Evans started having trouble with vomiting and diarrhea the last couple of weeks.  Leslie Evans called her doctor and was started on reglan.  Leslie Evans ran out of her protonix.  Pt was at work today and vomited several times.  Leslie Evans is feeling dehydrated and weak.  Leslie Evans has chronic diarrhea with her UC.  No change in the frequency.  Leslie Evans has several loose stools per day.  No fever.  Leslie Evans does have pain in her abd when vomiting.  Mostly in the upper abdomen.    Home Medications Prior to Admission medications   Medication Sig Start Date End Date Taking? Authorizing Provider  acetaminophen (TYLENOL) 500 MG tablet Take 500 mg by mouth every 6 (six) hours as needed.    [provider]  albuterol (PROVENTIL HFA) 108 (90 Base) MCG/ACT inhaler Inhale 2 puffs into the lungs every 4 (four) hours as needed for wheezing or shortness of breath. 12/14/20   Bo Merino, FNP  budesonide (ENTOCORT EC) 3 MG 24 hr capsule Take 3 capsules (9 mg total) by mouth once daily. 06/27/21   [name], MD  budesonide-formoterol [facility]) 160-4.5 MCG/ACT inhaler Inhale 2 puffs into the lungs 2 (two) times daily. 12/14/20   Bo Merino, FNP  citalopram (CELEXA) 40 MG tablet Take 1 tablet (40 mg total) by mouth once daily. 12/14/20   Bo Merino, FNP  dicyclomine (BENTYL) 20 MG tablet Take 1 tablet (20 mg total) by mouth 3 (three) times daily as needed for spasms (abd cramping). 09/04/21   Bo Merino, FNP  hydrOXYzine (VISTARIL) 25 MG capsule TAKE 1 TO 2 CAPSULES BY MOUTH TWICE DAILY AS NEEDED FOR INSOMNIA FOR ANXIETY 02/26/21   Delsa Grana, PA-C  lisinopril (ZESTRIL) 40 MG tablet Take 1 tablet (40 mg total)  by mouth once daily. 12/14/20   Bo Merino, FNP  metoCLOPramide (REGLAN) 10 MG tablet Take 1 tablet (10 mg total) by mouth every 6 (six) hours as needed for nausea or vomiting. 09/04/21   Bo Merino, FNP  ondansetron (ZOFRAN ODT) 4 MG disintegrating tablet Dissolve 1-2 tablets (4-8 mg total) by mouth once every 8 (eight) hours as needed for nausea or vomiting. 09/09/21   Dorie Rank, MD  Pancrelipase, Lip-Prot-Amyl, (ZENPEP) [phone] units CPEP Take 2 capsules with the first bite of each meal and 1 capsule with the first bite of each snack 06/27/21   [name], MD  pantoprazole (PROTONIX) 40 MG tablet Take 1 tablet (40 mg total) by mouth daily. 09/09/21   Dorie Rank, MD  rosuvastatin (CRESTOR) 5 MG tablet Take 1 tablet (5 mg total) by mouth once daily. 12/14/20   Bo Merino, FNP  sucralfate (CARAFATE) 1 g tablet Take 1 tablet (1 g total) by mouth 3 (three) times daily with meals as needed for GERD/reflux/epigastric pain when eating or drinking. 09/13/20   [name], MD  tiZANidine (ZANAFLEX) 4 MG tablet Take 0.5-1 tablets (2-4 mg total) by mouth every 8 (eight) hours as needed for muscle spasms (muscle tightness). 12/14/20   Bo Merino, FNP      Allergies    Pregabalin, Tetracycline, Gabapentin, Ibuprofen,  and Lamisil [terbinafine]    Review of Systems   Review of Systems  Gastrointestinal:  Positive for vomiting.    Physical Exam Updated Vital Signs BP 111/71   Pulse 61   Temp 98.4 F (36.9 C) (Oral)   Resp 19   SpO2 95%  Physical Exam Vitals and nursing note reviewed.  Constitutional:      General: Leslie Evans is not in acute distress.    Appearance: Leslie Evans is well-developed.  HENT:     Head: Normocephalic and atraumatic.     Right Ear: External ear normal.     Left Ear: External ear normal.  Eyes:     General: No scleral icterus.       Right eye: No discharge.        Left eye: No discharge.     Conjunctiva/sclera: Conjunctivae normal.  Neck:      Trachea: No tracheal deviation.  Cardiovascular:     Rate and Rhythm: Normal rate and regular rhythm.  Pulmonary:     Effort: Pulmonary effort is normal. No respiratory distress.     Breath sounds: Normal breath sounds. No stridor. No wheezing or rales.  Abdominal:     General: Bowel sounds are normal. There is no distension.     Palpations: Abdomen is soft.     Tenderness: There is abdominal tenderness. There is no guarding or rebound.  Musculoskeletal:        General: No tenderness or deformity.     Cervical back: Neck supple.  Skin:    General: Skin is warm and dry.     Findings: No rash.  Neurological:     General: No focal deficit present.     Mental Status: Leslie Evans is alert.     Cranial Nerves: No cranial nerve deficit (no facial droop, extraocular movements intact, no slurred speech).     Sensory: No sensory deficit.     Motor: No abnormal muscle tone or seizure activity.     Coordination: Coordination normal.  Psychiatric:        Mood and Affect: Mood normal.     ED Results / Procedures / Treatments   Labs (all labs ordered are listed, but only abnormal results are displayed) Labs Reviewed  COMPREHENSIVE METABOLIC PANEL - Abnormal; Notable for the following components:      Result Value   Glucose, Bld 114 (*)    Total Protein 5.9 (*)    Albumin 3.2 (*)    AST 13 (*)    All other components within normal limits  CBC - Abnormal; Notable for the following components:   WBC 11.5 (*)    All other components within normal limits  URINALYSIS, ROUTINE W REFLEX MICROSCOPIC - Abnormal; Notable for the following components:   Color, Urine STRAW (*)    Specific Gravity, Urine 1.003 (*)    All other components within normal limits  LIPASE, BLOOD    EKG EKG Interpretation  Date/Time:  Monday September 09 2021 07:33:35 EDT Ventricular Rate:  73 PR Interval:  160 QRS Duration: 86 QT Interval:  362 QTC Calculation: 398 R Axis:   -47 Text Interpretation: Normal sinus  rhythm Biatrial enlargement Left axis deviation Abnormal ECG No previous ECGs available Confirmed by Dorie Rank [phone]) on 09/09/2021 8:01:58 AM  Radiology DG Abd Acute W/Chest  Result Date: 09/09/2021 CLINICAL DATA:  66 year old female with abdominal pain, nausea vomiting. EXAM: DG ABDOMEN ACUTE WITH 1 VIEW CHEST COMPARISON:  Chest radiographs 02/07/2019 and earlier. CT Abdomen and Pelvis  10/15/2020. FINDINGS: AP upright view of the chest at 0821 hours. Chronic pulmonary hyperinflation and mild diffuse increased interstitial markings. Mediastinal contours are stable and within normal limits. No pneumothorax or pneumoperitoneum. No acute pulmonary opacity. Visualized tracheal air column is within normal limits. Upright and supine views of the abdomen and pelvis at 0825 hours. No pneumoperitoneum. Non obstructed bowel gas pattern. Abdominal and pelvic visceral contours appear stable. Small chronic bone anchors at the pubic rami. No acute osseous abnormality identified. IMPRESSION: 1. Normal bowel gas pattern, no free air. 2. Chronic pulmonary hyperinflation. No acute cardiopulmonary abnormality. Electronically Signed   By: Genevie Ann M.D.   On: 09/09/2021 08:57    Procedures Procedures    Medications Ordered in ED Medications  sodium chloride 0.9 % bolus 1,000 mL (1,000 mLs Intravenous New Bag/Given 09/09/21 0852)    And  sodium chloride 0.9 % bolus 1,000 mL (1,000 mLs Intravenous New Bag/Given 09/09/21 0852)    And  0.9 %  sodium chloride infusion ( Intravenous New Bag/Given 09/09/21 0942)  ondansetron (ZOFRAN-ODT) disintegrating tablet 4 mg (4 mg Oral Given 09/09/21 0734)  pantoprazole (PROTONIX) injection 40 mg (40 mg Intravenous Given 09/09/21 0853)    ED Course/ Medical Decision Making/ A&P Clinical Course as of 09/09/21 1029  Mon Sep 09, 2021  0913 Comprehensive metabolic panel(!) Metabolic panel without signs of severe dehydration, no significant electrolyte abnormalities [JK]  0913 Lipase,  blood Normal [JK]  0913 CBC(!) White blood cell count slightly elevated [JK]  0913 DG Abd Acute W/Chest Acute abdominal series without signs of obstruction [JK]  1006 Urinalysis, Routine w reflex microscopic Urine, Clean Catch(!) without signs of infection [JK]    Clinical Course User Index [JK] Dorie Rank, MD                           Medical Decision Making Problems Addressed: Nausea and vomiting, unspecified vomiting type: acute illness or injury that poses a threat to life or bodily functions  Amount and/or Complexity of Data Reviewed Labs: ordered. Decision-making details documented in ED Course. Radiology: ordered and independent interpretation performed. Decision-making details documented in ED Course.  Risk Prescription drug management. Drug therapy requiring intensive monitoring for toxicity. Risk Details: Patient presented to the emergency room for evaluation of vomiting mild abdominal discomfort.  Leslie Evans was concerned about dehydration.  Leslie Evans does have history of colitis and chronic diarrhea.  Patient ran ton of her Protonix prescription.  Leslie Evans thinks this is when her symptoms got worse.  ED work-up without signs of severe dehydration.  Abdominal x-rays not suggestive of bowel obstruction.  No signs of UTI or pancreatitis.  No signs hepatitis.  Patient improved with IV hydration and antacids and antiemetics.  Leslie Evans is feeling much better now and ready for discharge   Evaluation and diagnostic testing in the emergency department does not suggest an emergent condition requiring admission or immediate intervention beyond what has been performed at this time.  The patient is safe for discharge and has been instructed to return immediately for worsening symptoms, change in symptoms or any other concerns.         Final Clinical Impression(s) / ED Diagnoses Final diagnoses:  Nausea and vomiting, unspecified vomiting type    Rx / DC Orders ED Discharge Orders          Ordered     pantoprazole (PROTONIX) 40 MG tablet  Daily        09/09/21 1027  ondansetron (ZOFRAN ODT) 4 MG disintegrating tablet  Every 8 hours PRN        09/09/21 1027              Dorie Rank, MD 09/09/21 1032

## 2021-09-09 NOTE — ED Triage Notes (Signed)
Patient here with complaints of feeling dehydrated, w/ nausea/ emesis/ diarrhea. Patient with dx of UC, and pancreatitis. Patient reports feeling very malaised and weak and feels like she's currently having a flare up.

## 2021-12-17 ENCOUNTER — Other Ambulatory Visit: Payer: Self-pay | Admitting: Nurse Practitioner

## 2021-12-17 DIAGNOSIS — F419 Anxiety disorder, unspecified: Secondary | ICD-10-CM

## 2021-12-17 DIAGNOSIS — I10 Essential (primary) hypertension: Secondary | ICD-10-CM

## 2021-12-17 NOTE — Telephone Encounter (Signed)
Requested Prescriptions  Pending Prescriptions Disp Refills   lisinopril (ZESTRIL) 40 MG tablet [Pharmacy Med Name: Lisinopril 40 MG Oral Tablet] 90 tablet 0    Sig: Take 1 tablet by mouth once daily     Cardiovascular:  ACE Inhibitors Passed - 12/17/2021 10:38 AM      Passed - Cr in normal range and within 180 days    Creat  Date Value Ref Range Status  09/04/2021 0.66 0.50 - 1.05 mg/dL Final   Creatinine, Ser  Date Value Ref Range Status  09/09/2021 0.76 0.44 - 1.00 mg/dL Final         Passed - K in normal range and within 180 days    Potassium  Date Value Ref Range Status  09/09/2021 3.7 3.5 - 5.1 mmol/L Final         Passed - Patient is not pregnant      Passed - Last BP in normal range    BP Readings from Last 1 Encounters:  09/09/21 101/60         Passed - Valid encounter within last 6 months    Recent Outpatient Visits           3 months ago Nausea and vomiting, unspecified vomiting type   [facility] Bo Merino, FNP   1 year ago Essential hypertension   [facility] Bo Merino, FNP   1 year ago Collagenous colitis   [facility] Delsa Grana, PA-C   1 year ago Encounter for screening mammogram for malignant neoplasm of breast   [facility] Delsa Grana, PA-C   1 year ago Dehydration   [facility] Delsa Grana, PA-C       Future Appointments             In 1 week Vanga, Tally Due, MD [place]             citalopram ([place]) 40 MG tablet [Pharmacy Med Name: Citalopram Hydrobromide 40 MG Oral Tablet] 90 tablet 0    Sig: Take 1 tablet by mouth once daily     Psychiatry:  Antidepressants - SSRI Passed - 12/17/2021 10:38 AM      Passed - Valid encounter within last 6 months    Recent Outpatient Visits           3 months ago Nausea and vomiting, unspecified vomiting type   [facility] Bo Merino, FNP    1 year ago Essential hypertension   [facility] Bo Merino, FNP   1 year ago Collagenous colitis   [facility] Delsa Grana, PA-C   1 year ago Encounter for screening mammogram for malignant neoplasm of breast   [facility] Delsa Grana, PA-C   1 year ago Dehydration   [facility] Delsa Grana, PA-C       Future Appointments             In 1 week Vanga, Tally Due, MD [place]

## 2021-12-25 ENCOUNTER — Ambulatory Visit: Payer: Self-pay | Admitting: Gastroenterology

## 2021-12-25 ENCOUNTER — Other Ambulatory Visit: Payer: Self-pay | Admitting: Nurse Practitioner

## 2021-12-25 DIAGNOSIS — E7800 Pure hypercholesterolemia, unspecified: Secondary | ICD-10-CM

## 2021-12-25 DIAGNOSIS — J4489 Other specified chronic obstructive pulmonary disease: Secondary | ICD-10-CM

## 2021-12-26 NOTE — Telephone Encounter (Signed)
Requested medication (s) are due for refill today: expired medications  Requested medication (s) are on the active medication list: yes  Last refill:  12/14/20 #90 3 refills- crestor, 12/14/20 #6.7 g 3 refills- albuterol  Future visit scheduled: no  Notes to clinic:  expired medication . Do you want to renew rxs?     Requested Prescriptions  Pending Prescriptions Disp Refills   rosuvastatin (CRESTOR) 5 MG tablet [Pharmacy Med Name: Rosuvastatin Calcium 5 MG Oral Tablet] 90 tablet 0    Sig: Take 1 tablet by mouth once daily     Cardiovascular:  Antilipid - Statins 2 Failed - 12/25/2021  3:19 PM      Failed - Lipid Panel in normal range within the last 12 months    Cholesterol  Date Value Ref Range Status  12/14/2020 163 <200 mg/dL Final   LDL Cholesterol (Calc)  Date Value Ref Range Status  12/14/2020 88 mg/dL (calc) Final    Comment:    Reference range: <100 . Desirable range <100 mg/dL for primary prevention;   <70 mg/dL for patients with CHD or diabetic patients  with > or = 2 CHD risk factors. Marland Kitchen LDL-C is now calculated using the Martin-Hopkins  calculation, which is a validated novel method providing  better accuracy than the Friedewald equation in the  estimation of LDL-C.  Cresenciano Genre et al. Annamaria Helling. 7494;496(75): 2061-2068  (http://education.[employer].com/faq/FAQ164)    HDL  Date Value Ref Range Status  12/14/2020 51 > OR = 50 mg/dL Final   Triglycerides  Date Value Ref Range Status  12/14/2020 139 <150 mg/dL Final         Passed - Cr in normal range and within 360 days    Creat  Date Value Ref Range Status  09/04/2021 0.66 0.50 - 1.05 mg/dL Final   Creatinine, Ser  Date Value Ref Range Status  09/09/2021 0.76 0.44 - 1.00 mg/dL Final         Passed - Patient is not pregnant      Passed - Valid encounter within last 12 months    Recent Outpatient Visits           3 months ago Nausea and vomiting, unspecified vomiting type   [facility] Bo Merino, FNP   1 year ago Essential hypertension   [facility] Bo Merino, FNP   1 year ago Collagenous colitis   [facility] Delsa Grana, PA-C   1 year ago Encounter for screening mammogram for malignant neoplasm of breast   [facility] Delsa Grana, PA-C   1 year ago Dehydration   [facility] Delsa Grana, PA-C               albuterol (VENTOLIN HFA) 108 (90 Base) MCG/ACT inhaler [Pharmacy Med Name: Albuterol Sulfate HFA 108 (90 Base) MCG/ACT Inhalation Aerosol Solution] 7 each 0    Sig: INHALE 2 PUFFS BY MOUTH EVERY 4 HOURS AS NEEDED FOR WHEEZING FOR SHORTNESS OF BREATH     Pulmonology:  Beta Agonists 2 Passed - 12/25/2021  3:19 PM      Passed - Last BP in normal range    BP Readings from Last 1 Encounters:  09/09/21 101/60         Passed - Last Heart Rate in normal range    Pulse Readings from Last 1 Encounters:  09/09/21 65         Passed - Valid encounter within  last 12 months    Recent Outpatient Visits           3 months ago Nausea and vomiting, unspecified vomiting type   [facility] Bo Merino, FNP   1 year ago Essential hypertension   [facility] Bo Merino, FNP   1 year ago Collagenous colitis   [facility] Delsa Grana, PA-C   1 year ago Encounter for screening mammogram for malignant neoplasm of breast   [facility] Delsa Grana, PA-C   1 year ago Dehydration   [facility] Delsa Grana, [place]

## 2021-12-31 IMAGING — CT CT ABD-PELV W/ CM
2 of 5 series · 16 of 46 positions shown, 18 images · IV contrast (APPLIED)
Comparison: X-ray Chest 02/07/2019; CT Chest 03/30/2013; U/S Pelvis
06/02/2002.

CLINICAL DATA: Pancreatitis, persistent. Chronic abdominal pain.
Diarrhea. Weight loss. Fatigue. History of pancreatitis and colitis.

EXAM:
CT ABDOMEN AND PELVIS WITH CONTRAST
TECHNIQUE: Multidetector CT imaging of the abdomen and pelvis was performed
using the standard protocol following bolus administration of
intravenous contrast.
CONTRAST:  75mL OMNIPAQUE IOHEXOL 350 MG/ML SOLN

[Series 2: routine abd/pel with · axial · 0.70mm/px · z∈[-874,-494]mm · 13 of 86 slices shown, 15 images]
[im 5/86  soft-tissue]
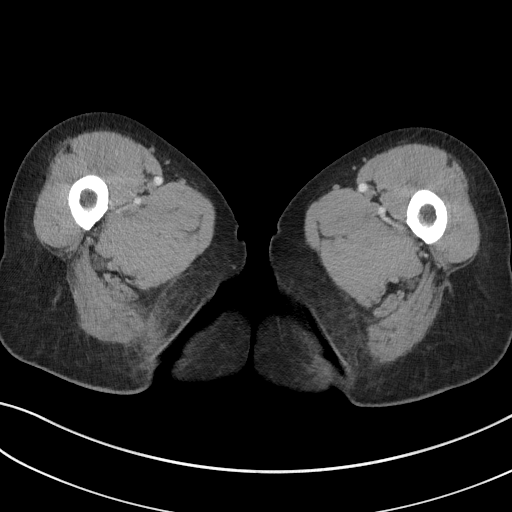
[im 5/86  bone]
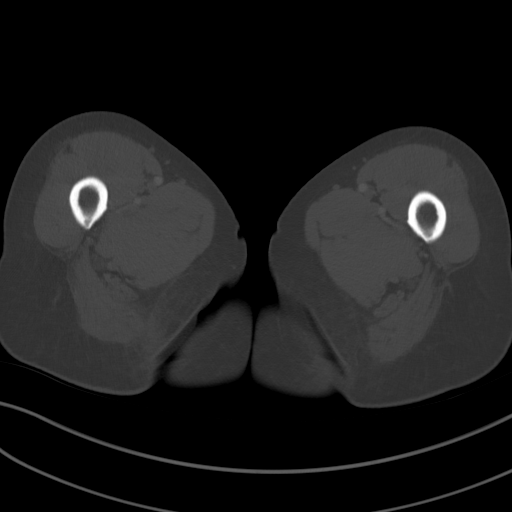
[im 10/86  soft-tissue]
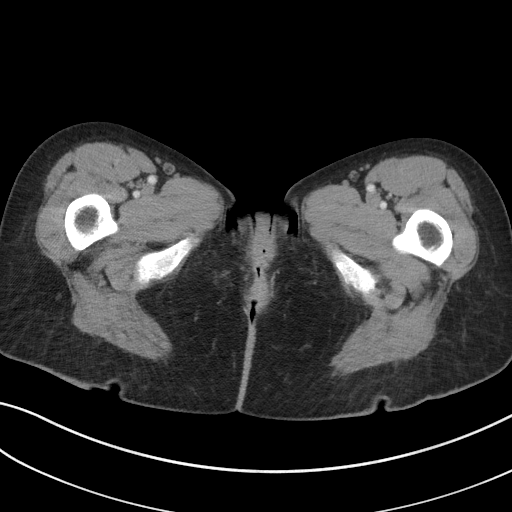
[im 19/86  soft-tissue]
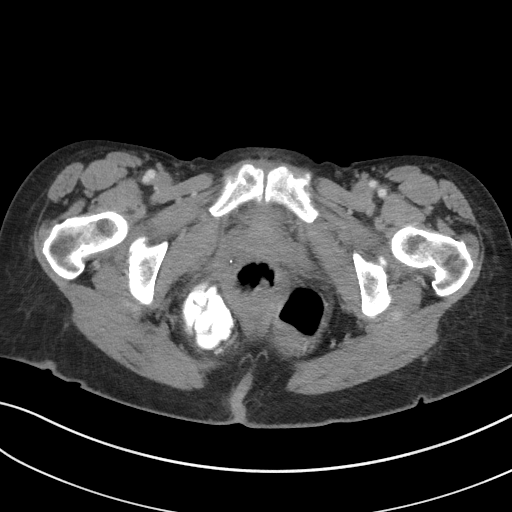
[im 24/86  soft-tissue]
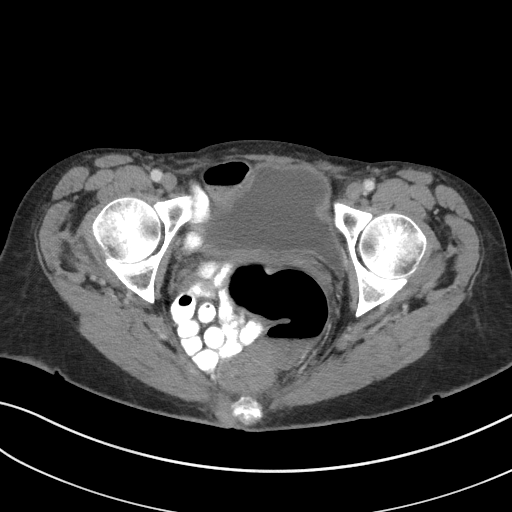
[im 29/86  soft-tissue]
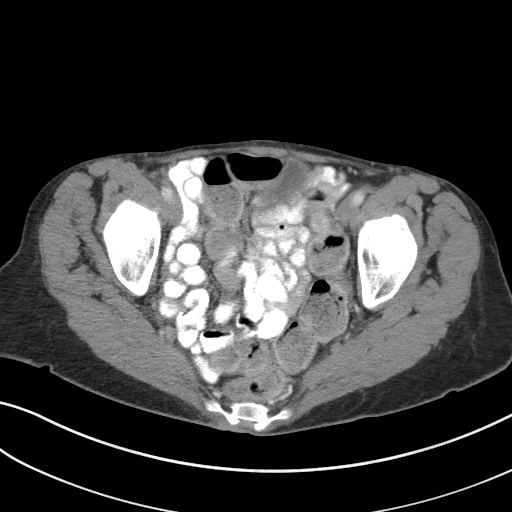
[im 38/86  soft-tissue]
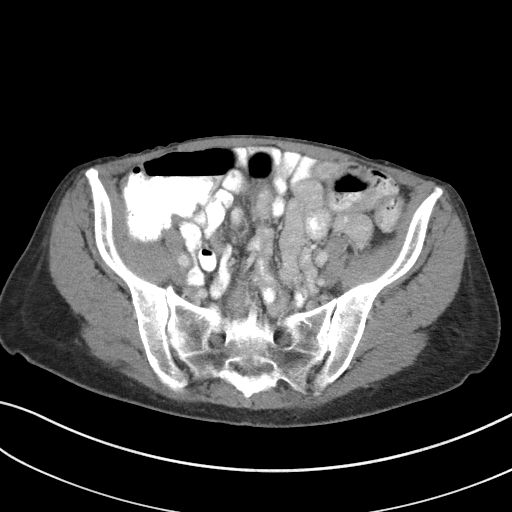
[im 43/86  soft-tissue]
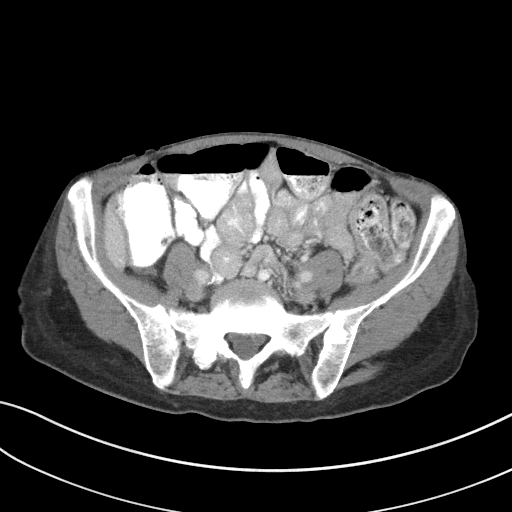
[im 48/86  soft-tissue]
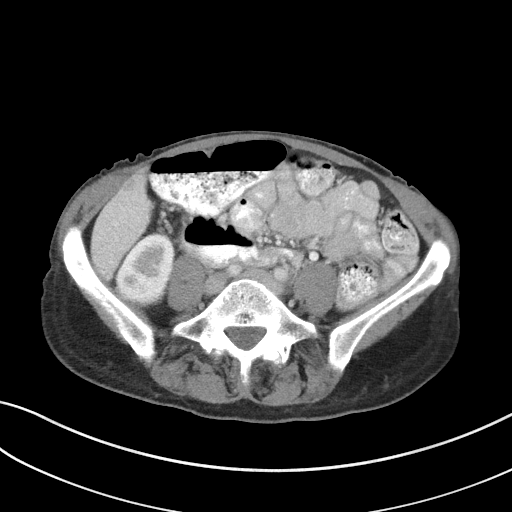
[im 57/86  soft-tissue]
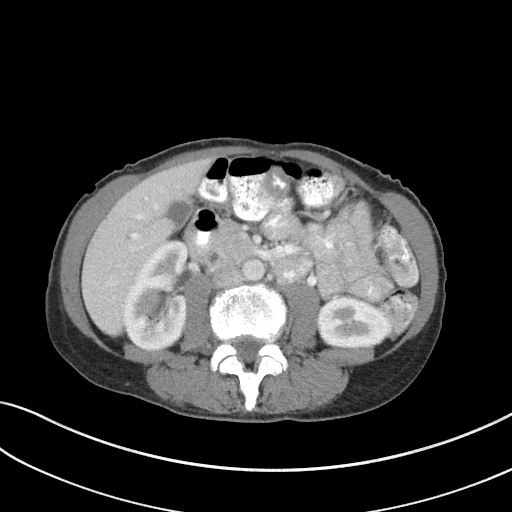
[im 57/86  bone]
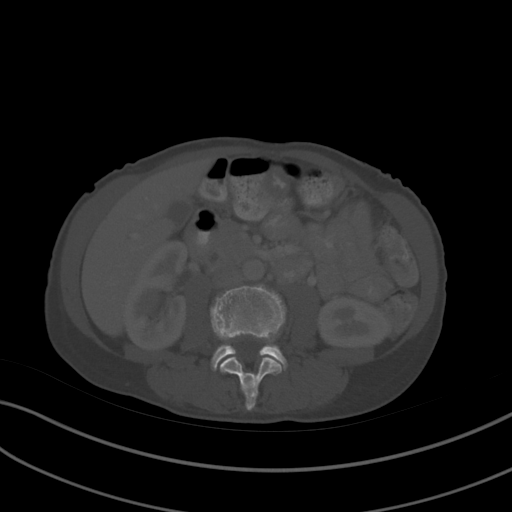
[im 62/86  soft-tissue]
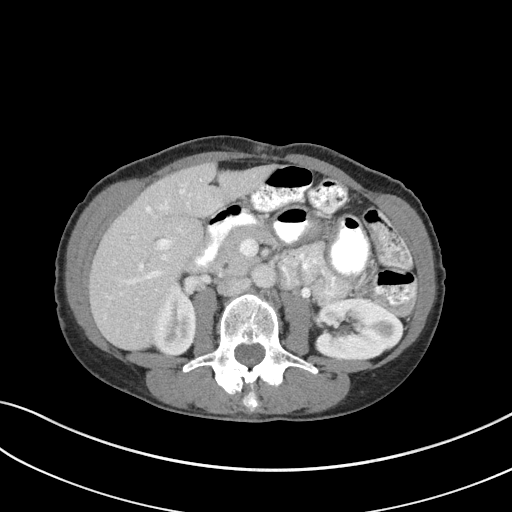
[im 67/86  soft-tissue]
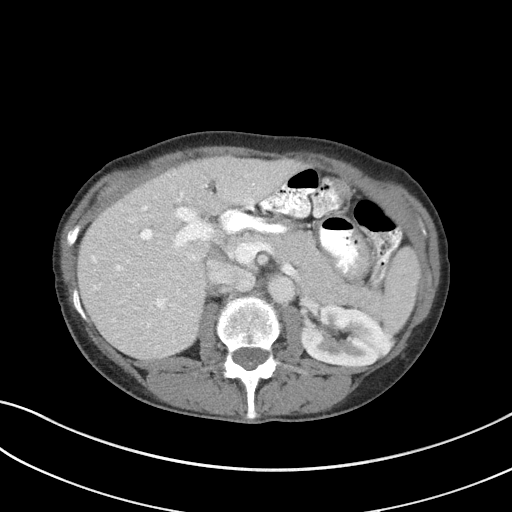
[im 76/86  soft-tissue]
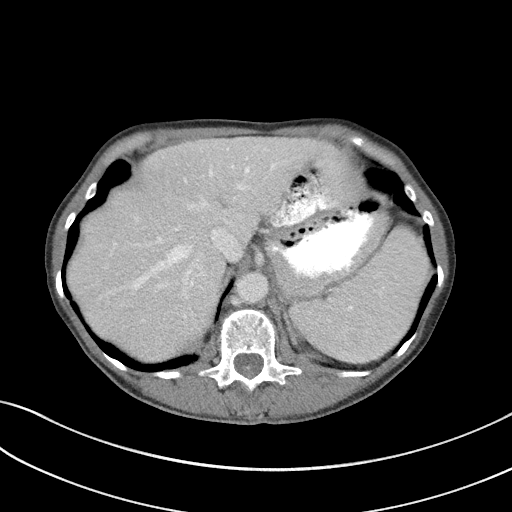
[im 81/86  soft-tissue]
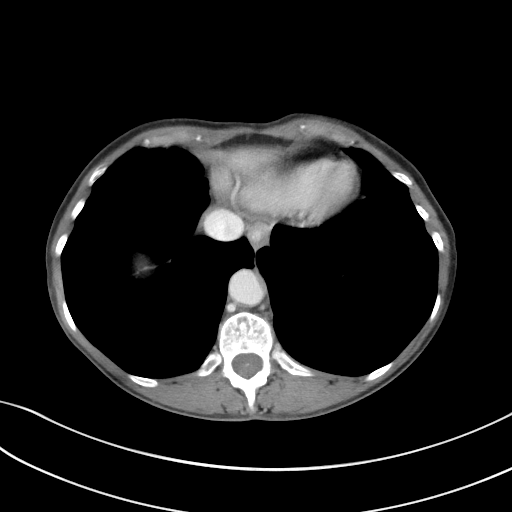

[Series 5: coronal st · coronal · 0.70mm/px · 3 of 77 slices shown]
[im 26/77  soft-tissue]
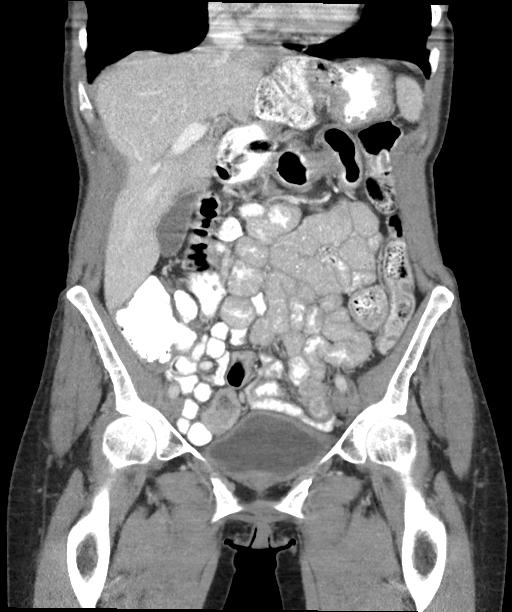
[im 34/77  soft-tissue]
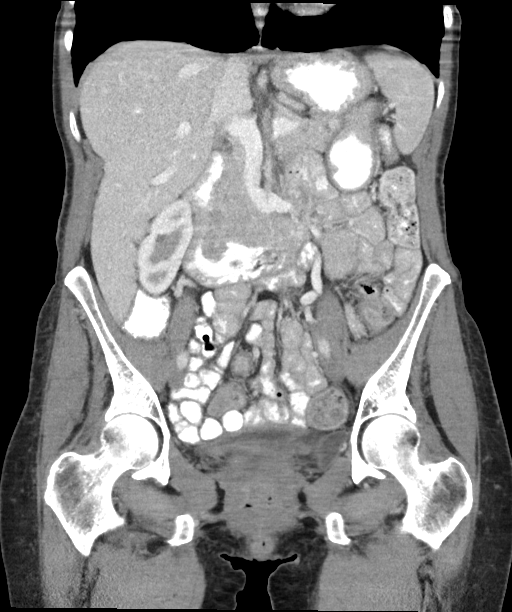
[im 43/77  soft-tissue]
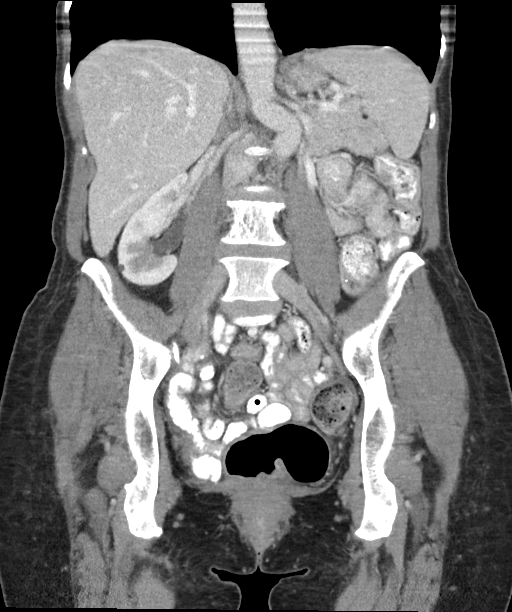

[16 of 46 positions shown; findings below may reference images not displayed]

FINDINGS: Lower chest: No significant pulmonary nodules or acute consolidative
airspace disease.

Hepatobiliary: Top-normal liver size. Hypodense subcentimeter
peripheral right liver lesion is too small to characterize and
correlates with a stable focus of hyperenhancement on 03/30/2013 CT,
considered benign. Subcentimeter subcapsular hypodense anterior
inferior right liver lesion is too small to characterize. No
additional liver lesions. Normal gallbladder with no radiopaque
cholelithiasis. No biliary ductal dilatation.

Pancreas: Normal, with no mass or duct dilation. No peripancreatic
fat stranding or fluid collections. No pancreatic parenchymal
calcifications.

Spleen: Normal size. No mass.

Adrenals/Urinary Tract: Normal adrenals. Scattered subcentimeter
hypodense bilateral renal cortical lesions are too small to
characterize and require no follow-up. No hydronephrosis. Small
coarse parenchymal calcification with associated minimal renal
cortical scarring in the upper right kidney. Normal bladder.

Stomach/Bowel: Normal non-distended stomach. Normal caliber small
bowel with no small bowel wall thickening. Normal appendix. Oral
contrast transits to the left colon. Normal large bowel with no
diverticulosis, large bowel wall thickening or pericolonic fat
stranding.

Vascular/Lymphatic: Atherosclerotic nonaneurysmal abdominal aorta.
Patent portal, splenic, hepatic and renal veins. No pathologically
enlarged lymph nodes in the abdomen or pelvis.

Reproductive: Status post hysterectomy, with no abnormal findings at
the vaginal cuff. No adnexal mass.

Other: No pneumoperitoneum, ascites or focal fluid collection.

Musculoskeletal: No aggressive appearing focal osseous lesions. Mild
thoracolumbar spondylosis.
IMPRESSION: No acute abnormality. No evidence of bowel obstruction or acute
bowel inflammation. Normal appendix. No CT findings of acute or
chronic pancreatitis.

Aortic Atherosclerosis ([id number]).

## 2022-02-25 ENCOUNTER — Other Ambulatory Visit: Payer: Self-pay | Admitting: Nurse Practitioner

## 2022-02-25 DIAGNOSIS — I10 Essential (primary) hypertension: Secondary | ICD-10-CM

## 2022-02-26 NOTE — Telephone Encounter (Signed)
Requested Prescriptions  Pending Prescriptions Disp Refills   lisinopril (ZESTRIL) 40 MG tablet [Pharmacy Med Name: Lisinopril 40 MG Oral Tablet] 90 tablet 0    Sig: Take 1 tablet by mouth once daily     Cardiovascular:  ACE Inhibitors Passed - 02/25/2022  1:51 PM      Passed - Cr in normal range and within 180 days    Creat  Date Value Ref Range Status  09/04/2021 0.66 0.50 - 1.05 mg/dL Final   Creatinine, Ser  Date Value Ref Range Status  09/09/2021 0.76 0.44 - 1.00 mg/dL Final         Passed - K in normal range and within 180 days    Potassium  Date Value Ref Range Status  09/09/2021 3.7 3.5 - 5.1 mmol/L Final         Passed - Patient is not pregnant      Passed - Last BP in normal range    BP Readings from Last 1 Encounters:  09/09/21 101/60         Passed - Valid encounter within last 6 months    Recent Outpatient Visits           5 months ago Nausea and vomiting, unspecified vomiting type   [facility] Bo Merino, FNP   1 year ago Essential hypertension   [place], FNP   1 year ago Collagenous colitis   [facility] Delsa Grana, PA-C   2 years ago Encounter for screening mammogram for malignant neoplasm of breast   [facility] Delsa Grana, [place]   2 years ago Dehydration   [facility] Delsa Grana, [place]

## 2022-04-28 ENCOUNTER — Telehealth: Payer: Self-pay | Admitting: Gastroenterology

## 2022-04-28 NOTE — Telephone Encounter (Signed)
Pt left message in severe stomach pain she thinks its pancretitis  started Saturday  please return call

## 2022-04-28 NOTE — Telephone Encounter (Signed)
Patient is having mid abdominal pain and having abdominal cramping in the lower stomach. The pain states the pain is a sharp pain. States she has tried worse when she walks or stands up. The pain is a 8 out of 10 on the pain scale. She states she is vomiting and not able to keep anything down. Advise patient she needs to go to the ER. She states she will go as soon as someone comes and gets her. She wanted to let Dr. Allegra Lai know

## 2022-05-02 ENCOUNTER — Encounter
Admission: EM | Disposition: A | Discharge status: Home / Self Care | Payer: Self-pay | Source: Home / Self Care | Attending: General Surgery

## 2022-05-02 ENCOUNTER — Observation Stay: Payer: Medicare Other | Admitting: Certified Registered"

## 2022-05-02 ENCOUNTER — Inpatient Hospital Stay
Admission: EM | Admit: 2022-05-02 | Discharge: 2022-05-09 | DRG: 335 | Disposition: A | Discharge status: Home / Self Care | Payer: Medicare Other | Attending: General Surgery | Admitting: General Surgery

## 2022-05-02 ENCOUNTER — Emergency Department: Payer: Medicare Other

## 2022-05-02 ENCOUNTER — Other Ambulatory Visit: Payer: Self-pay

## 2022-05-02 DIAGNOSIS — R1084 Generalized abdominal pain: Secondary | ICD-10-CM | POA: Diagnosis not present

## 2022-05-02 DIAGNOSIS — F419 Anxiety disorder, unspecified: Secondary | ICD-10-CM | POA: Diagnosis present

## 2022-05-02 DIAGNOSIS — J4489 Other specified chronic obstructive pulmonary disease: Secondary | ICD-10-CM | POA: Diagnosis present

## 2022-05-02 DIAGNOSIS — K8689 Other specified diseases of pancreas: Secondary | ICD-10-CM | POA: Diagnosis present

## 2022-05-02 DIAGNOSIS — Z888 Allergy status to other drugs, medicaments and biological substances status: Secondary | ICD-10-CM

## 2022-05-02 DIAGNOSIS — Z881 Allergy status to other antibiotic agents status: Secondary | ICD-10-CM | POA: Diagnosis not present

## 2022-05-02 DIAGNOSIS — K5190 Ulcerative colitis, unspecified, without complications: Secondary | ICD-10-CM | POA: Diagnosis present

## 2022-05-02 DIAGNOSIS — Z833 Family history of diabetes mellitus: Secondary | ICD-10-CM | POA: Diagnosis not present

## 2022-05-02 DIAGNOSIS — E785 Hyperlipidemia, unspecified: Secondary | ICD-10-CM | POA: Diagnosis present

## 2022-05-02 DIAGNOSIS — K469 Unspecified abdominal hernia without obstruction or gangrene: Secondary | ICD-10-CM | POA: Diagnosis not present

## 2022-05-02 DIAGNOSIS — Z7951 Long term (current) use of inhaled steroids: Secondary | ICD-10-CM | POA: Diagnosis not present

## 2022-05-02 DIAGNOSIS — E43 Unspecified severe protein-calorie malnutrition: Secondary | ICD-10-CM | POA: Insufficient documentation

## 2022-05-02 DIAGNOSIS — I10 Essential (primary) hypertension: Secondary | ICD-10-CM | POA: Diagnosis present

## 2022-05-02 DIAGNOSIS — Z681 Body mass index (BMI) 19 or less, adult: Secondary | ICD-10-CM | POA: Diagnosis not present

## 2022-05-02 DIAGNOSIS — K458 Other specified abdominal hernia without obstruction or gangrene: Principal | ICD-10-CM | POA: Diagnosis present

## 2022-05-02 DIAGNOSIS — K56609 Unspecified intestinal obstruction, unspecified as to partial versus complete obstruction: Secondary | ICD-10-CM

## 2022-05-02 DIAGNOSIS — Z886 Allergy status to analgesic agent status: Secondary | ICD-10-CM

## 2022-05-02 DIAGNOSIS — Z79899 Other long term (current) drug therapy: Secondary | ICD-10-CM | POA: Diagnosis not present

## 2022-05-02 DIAGNOSIS — N200 Calculus of kidney: Secondary | ICD-10-CM | POA: Diagnosis not present

## 2022-05-02 DIAGNOSIS — K5669 Other partial intestinal obstruction: Secondary | ICD-10-CM | POA: Diagnosis not present

## 2022-05-02 DIAGNOSIS — Z90710 Acquired absence of both cervix and uterus: Secondary | ICD-10-CM

## 2022-05-02 DIAGNOSIS — Z8616 Personal history of COVID-19: Secondary | ICD-10-CM | POA: Diagnosis not present

## 2022-05-02 DIAGNOSIS — Z8249 Family history of ischemic heart disease and other diseases of the circulatory system: Secondary | ICD-10-CM

## 2022-05-02 DIAGNOSIS — R109 Unspecified abdominal pain: Secondary | ICD-10-CM | POA: Diagnosis not present

## 2022-05-02 DIAGNOSIS — F17210 Nicotine dependence, cigarettes, uncomplicated: Secondary | ICD-10-CM | POA: Diagnosis present

## 2022-05-02 DIAGNOSIS — K450 Other specified abdominal hernia with obstruction, without gangrene: Principal | ICD-10-CM | POA: Diagnosis present

## 2022-05-02 DIAGNOSIS — Z825 Family history of asthma and other chronic lower respiratory diseases: Secondary | ICD-10-CM | POA: Diagnosis not present

## 2022-05-02 DIAGNOSIS — I700 Atherosclerosis of aorta: Secondary | ICD-10-CM | POA: Diagnosis not present

## 2022-05-02 DIAGNOSIS — K5651 Intestinal adhesions [bands], with partial obstruction: Secondary | ICD-10-CM | POA: Diagnosis not present

## 2022-05-02 DIAGNOSIS — Z5331 Laparoscopic surgical procedure converted to open procedure: Secondary | ICD-10-CM | POA: Diagnosis not present

## 2022-05-02 HISTORY — PX: LYSIS OF ADHESION: SHX5961

## 2022-05-02 HISTORY — PX: LAPAROTOMY: SHX154

## 2022-05-02 LAB — COMPREHENSIVE METABOLIC PANEL
ALT: 13 U/L (ref 0–44)
AST: 17 U/L (ref 15–41)
Albumin: 3.8 g/dL (ref 3.5–5.0)
Alkaline Phosphatase: 61 U/L (ref 38–126)
Anion gap: 8 (ref 5–15)
BUN: 11 mg/dL (ref 8–23)
CO2: 24 mmol/L (ref 22–32)
Calcium: 9.8 mg/dL (ref 8.9–10.3)
Chloride: 109 mmol/L (ref 98–111)
Creatinine, Ser: 0.68 mg/dL (ref 0.44–1.00)
GFR, Estimated: >60 mL/min (ref >60)
Glucose, Bld: 137 mg/dL — ABNORMAL HIGH (ref 70–99)
Potassium: 3.2 mmol/L — ABNORMAL LOW (ref 3.5–5.1)
Sodium: 141 mmol/L (ref 135–145)
Total Bilirubin: 0.7 mg/dL (ref 0.3–1.2)
Total Protein: 6.6 g/dL (ref 6.5–8.1)

## 2022-05-02 LAB — URINALYSIS, ROUTINE W REFLEX MICROSCOPIC
Bilirubin Urine: NEGATIVE
Glucose, UA: NEGATIVE mg/dL
Hgb urine dipstick: NEGATIVE
Ketones, ur: NEGATIVE mg/dL
Leukocytes,Ua: NEGATIVE
Nitrite: NEGATIVE
Protein, ur: NEGATIVE mg/dL
Specific Gravity, Urine: 1.004 — ABNORMAL LOW (ref 1.005–1.030)
pH: 7 (ref 5.0–8.0)

## 2022-05-02 LAB — CBC
HCT: 44.3 % (ref 36.0–46.0)
Hemoglobin: 15.9 g/dL — ABNORMAL HIGH (ref 12.0–15.0)
MCH: 31.5 pg (ref 26.0–34.0)
MCHC: 35.9 g/dL (ref 30.0–36.0)
MCV: 87.7 fL (ref 80.0–100.0)
Platelets: 243 10*3/uL (ref 150–400)
RBC: 5.05 MIL/uL (ref 3.87–5.11)
RDW: 13.2 % (ref 11.5–15.5)
WBC: 6.5 10*3/uL (ref 4.0–10.5)
nRBC: 0 % (ref 0.0–0.2)

## 2022-05-02 LAB — LIPASE, BLOOD: Lipase: 50 U/L (ref 11–51)

## 2022-05-02 SURGERY — LAPAROTOMY, FOR LYSIS OF ADHESIONS
Anesthesia: General | Site: Abdomen

## 2022-05-02 MED ORDER — MOMETASONE FURO-FORMOTEROL FUM 200-5 MCG/ACT IN AERO: 2.0000 | INHALATION_SPRAY | Freq: Two times a day (BID) | RESPIRATORY_TRACT | Status: DC

## 2022-05-02 MED ORDER — KETAMINE HCL 50 MG/5ML IJ SOSY
PREFILLED_SYRINGE | INTRAMUSCULAR | Status: AC
  Filled 2022-05-02: qty 5

## 2022-05-02 MED ORDER — SODIUM CHLORIDE (PF) 0.9 % IJ SOLN
INTRAMUSCULAR | Status: DC | PRN
  Administered 2022-05-02: 50 mL

## 2022-05-02 MED ORDER — EPINEPHRINE PF 1 MG/ML IJ SOLN
INTRAMUSCULAR | Status: AC
  Filled 2022-05-02: qty 1

## 2022-05-02 MED ORDER — OXYCODONE HCL 5 MG/5ML PO SOLN: 5.0000 mg | Freq: Once | ORAL | Status: DC | PRN

## 2022-05-02 MED ORDER — BUPIVACAINE LIPOSOME 1.3 % IJ SUSP
INTRAMUSCULAR | Status: DC | PRN
  Administered 2022-05-02: 10 mL

## 2022-05-02 MED ORDER — MIDAZOLAM HCL 2 MG/2ML IJ SOLN
INTRAMUSCULAR | Status: AC
  Filled 2022-05-02: qty 2

## 2022-05-02 MED ORDER — SUCCINYLCHOLINE CHLORIDE 200 MG/10ML IV SOSY
PREFILLED_SYRINGE | INTRAVENOUS | Status: DC | PRN
  Administered 2022-05-02: 100 mg via INTRAVENOUS

## 2022-05-02 MED ORDER — ONDANSETRON 4 MG PO TBDP: 4.0000 mg | ORAL_TABLET | Freq: Four times a day (QID) | ORAL | Status: DC | PRN

## 2022-05-02 MED ORDER — PANTOPRAZOLE SODIUM 40 MG IV SOLR
40.0000 mg | Freq: Every day | INTRAVENOUS | Status: DC
  Administered 2022-05-02 – 2022-05-03 (×2): 40 mg via INTRAVENOUS
  Filled 2022-05-02 (×2): qty 10

## 2022-05-02 MED ORDER — ORAL CARE MOUTH RINSE: 15.0000 mL | Freq: Once | OROMUCOSAL | Status: AC

## 2022-05-02 MED ORDER — SODIUM CHLORIDE 0.9 % IV SOLN: INTRAVENOUS | Status: DC

## 2022-05-02 MED ORDER — CHLORHEXIDINE GLUCONATE 0.12 % MT SOLN
15.0000 mL | Freq: Once | OROMUCOSAL | Status: AC
  Administered 2022-05-02: 15 mL via OROMUCOSAL

## 2022-05-02 MED ORDER — BUPIVACAINE LIPOSOME 1.3 % IJ SUSP
INTRAMUSCULAR | Status: AC
  Filled 2022-05-02: qty 20

## 2022-05-02 MED ORDER — ACETAMINOPHEN 325 MG RE SUPP: 650.0000 mg | Freq: Four times a day (QID) | RECTAL | Status: DC | PRN

## 2022-05-02 MED ORDER — METOPROLOL TARTRATE 5 MG/5ML IV SOLN
INTRAVENOUS | Status: DC | PRN
  Administered 2022-05-02: 3 mg via INTRAVENOUS

## 2022-05-02 MED ORDER — BUPIVACAINE-EPINEPHRINE (PF) 0.25% -1:200000 IJ SOLN
INTRAMUSCULAR | Status: DC | PRN
  Administered 2022-05-02: 30 mL

## 2022-05-02 MED ORDER — IOHEXOL 300 MG/ML  SOLN
100.0000 mL | Freq: Once | INTRAMUSCULAR | Status: AC | PRN
Start: 2022-05-02 — End: 2022-05-02
  Administered 2022-05-02: 100 mL via INTRAVENOUS

## 2022-05-02 MED ORDER — ONDANSETRON HCL 4 MG/2ML IJ SOLN
4.0000 mg | Freq: Four times a day (QID) | INTRAMUSCULAR | Status: DC | PRN
  Administered 2022-05-03 – 2022-05-08 (×6): 4 mg via INTRAVENOUS
  Filled 2022-05-02 (×6): qty 2

## 2022-05-02 MED ORDER — PROPOFOL 10 MG/ML IV BOLUS
INTRAVENOUS | Status: DC | PRN
  Administered 2022-05-02: 150 ug/kg/min via INTRAVENOUS
  Administered 2022-05-02: 100 mg via INTRAVENOUS

## 2022-05-02 MED ORDER — CEFAZOLIN SODIUM-DEXTROSE 2-3 GM-%(50ML) IV SOLR
INTRAVENOUS | Status: DC | PRN
  Administered 2022-05-02: 2 g via INTRAVENOUS

## 2022-05-02 MED ORDER — PROPOFOL 1000 MG/100ML IV EMUL
INTRAVENOUS | Status: AC
  Filled 2022-05-02: qty 100

## 2022-05-02 MED ORDER — ENOXAPARIN SODIUM 40 MG/0.4ML IJ SOSY
40.0000 mg | PREFILLED_SYRINGE | INTRAMUSCULAR | Status: DC
  Administered 2022-05-03 – 2022-05-09 (×7): 40 mg via SUBCUTANEOUS
  Filled 2022-05-02 (×8): qty 0.4

## 2022-05-02 MED ORDER — ROCURONIUM BROMIDE 100 MG/10ML IV SOLN
INTRAVENOUS | Status: DC | PRN
  Administered 2022-05-02 (×2): 20 mg via INTRAVENOUS
  Administered 2022-05-02: 10 mg via INTRAVENOUS

## 2022-05-02 MED ORDER — PANTOPRAZOLE SODIUM 40 MG IV SOLR: 40.0000 mg | Freq: Every day | INTRAVENOUS | Status: DC

## 2022-05-02 MED ORDER — BUDESONIDE 3 MG PO CPEP
9.0000 mg | ORAL_CAPSULE | Freq: Every day | ORAL | Status: DC
  Filled 2022-05-02: qty 3

## 2022-05-02 MED ORDER — FENTANYL CITRATE (PF) 100 MCG/2ML IJ SOLN
25.0000 ug | INTRAMUSCULAR | Status: DC | PRN
  Administered 2022-05-02 (×2): 25 ug via INTRAVENOUS

## 2022-05-02 MED ORDER — BUPIVACAINE HCL (PF) 0.25 % IJ SOLN
INTRAMUSCULAR | Status: AC
  Filled 2022-05-02: qty 30

## 2022-05-02 MED ORDER — ONDANSETRON HCL 4 MG/2ML IJ SOLN
4.0000 mg | Freq: Four times a day (QID) | INTRAMUSCULAR | Status: DC | PRN
  Administered 2022-05-02: 4 mg via INTRAVENOUS

## 2022-05-02 MED ORDER — SUGAMMADEX SODIUM 200 MG/2ML IV SOLN
INTRAVENOUS | Status: DC | PRN
  Administered 2022-05-02: 200 mg via INTRAVENOUS

## 2022-05-02 MED ORDER — DICYCLOMINE HCL 20 MG PO TABS: 20.0000 mg | ORAL_TABLET | Freq: Three times a day (TID) | ORAL | Status: DC | PRN

## 2022-05-02 MED ORDER — CHLORHEXIDINE GLUCONATE 0.12 % MT SOLN
OROMUCOSAL | Status: AC
  Filled 2022-05-02: qty 15

## 2022-05-02 MED ORDER — SODIUM CHLORIDE 0.9 % IV BOLUS
1000.0000 mL | Freq: Once | INTRAVENOUS | Status: AC
  Administered 2022-05-02: 1000 mL via INTRAVENOUS

## 2022-05-02 MED ORDER — FENTANYL CITRATE (PF) 100 MCG/2ML IJ SOLN
INTRAMUSCULAR | Status: AC
  Filled 2022-05-02: qty 2

## 2022-05-02 MED ORDER — ENOXAPARIN SODIUM 40 MG/0.4ML IJ SOSY: 40.0000 mg | PREFILLED_SYRINGE | INTRAMUSCULAR | Status: DC

## 2022-05-02 MED ORDER — LIDOCAINE HCL (CARDIAC) PF 100 MG/5ML IV SOSY
PREFILLED_SYRINGE | INTRAVENOUS | Status: DC | PRN
  Administered 2022-05-02: 100 mg via INTRAVENOUS

## 2022-05-02 MED ORDER — ACETAMINOPHEN 10 MG/ML IV SOLN: 1000.0000 mg | Freq: Once | INTRAVENOUS | Status: DC | PRN

## 2022-05-02 MED ORDER — ONDANSETRON HCL 4 MG/2ML IJ SOLN
4.0000 mg | Freq: Once | INTRAMUSCULAR | Status: AC
  Administered 2022-05-02: 4 mg via INTRAVENOUS
  Filled 2022-05-02: qty 2

## 2022-05-02 MED ORDER — ALBUTEROL SULFATE (2.5 MG/3ML) 0.083% IN NEBU: 3.0000 mL | INHALATION_SOLUTION | Freq: Four times a day (QID) | RESPIRATORY_TRACT | Status: DC | PRN

## 2022-05-02 MED ORDER — MIDAZOLAM HCL 2 MG/2ML IJ SOLN
INTRAMUSCULAR | Status: DC | PRN
  Administered 2022-05-02 (×2): 2 mg via INTRAVENOUS

## 2022-05-02 MED ORDER — 0.9 % SODIUM CHLORIDE (POUR BTL) OPTIME
TOPICAL | Status: DC | PRN
  Administered 2022-05-02: 1000 mL
  Administered 2022-05-02: 500 mL

## 2022-05-02 MED ORDER — PANCRELIPASE (LIP-PROT-AMYL) 12000-38000 UNITS PO CPEP
40000.0000 [IU] | ORAL_CAPSULE | Freq: Two times a day (BID) | ORAL | Status: DC | PRN
  Filled 2022-05-02: qty 1

## 2022-05-02 MED ORDER — FENTANYL CITRATE PF 50 MCG/ML IJ SOSY
50.0000 ug | PREFILLED_SYRINGE | INTRAMUSCULAR | Status: DC | PRN
  Administered 2022-05-02: 50 ug via INTRAVENOUS
  Filled 2022-05-02: qty 1

## 2022-05-02 MED ORDER — LACTATED RINGERS IV SOLN: INTRAVENOUS | Status: DC

## 2022-05-02 MED ORDER — OXYCODONE HCL 5 MG PO TABS: 5.0000 mg | ORAL_TABLET | Freq: Once | ORAL | Status: DC | PRN

## 2022-05-02 MED ORDER — HYDROXYZINE HCL 25 MG PO TABS: 25.0000 mg | ORAL_TABLET | Freq: Three times a day (TID) | ORAL | Status: DC | PRN

## 2022-05-02 MED ORDER — FENTANYL CITRATE (PF) 100 MCG/2ML IJ SOLN
INTRAMUSCULAR | Status: DC | PRN
  Administered 2022-05-02: 100 ug via INTRAVENOUS

## 2022-05-02 MED ORDER — ACETAMINOPHEN 325 MG PO TABS: 650.0000 mg | ORAL_TABLET | Freq: Four times a day (QID) | ORAL | Status: DC | PRN

## 2022-05-02 MED ORDER — ONDANSETRON HCL 4 MG/2ML IJ SOLN: 4.0000 mg | Freq: Once | INTRAMUSCULAR | Status: DC | PRN

## 2022-05-02 MED ORDER — CITALOPRAM HYDROBROMIDE 20 MG PO TABS
40.0000 mg | ORAL_TABLET | Freq: Every day | ORAL | Status: DC
Start: 2022-05-02 — End: 2022-05-02

## 2022-05-02 MED ORDER — SODIUM CHLORIDE (PF) 0.9 % IJ SOLN
INTRAMUSCULAR | Status: AC
  Filled 2022-05-02: qty 50

## 2022-05-02 MED ORDER — KETOROLAC TROMETHAMINE 30 MG/ML IJ SOLN
15.0000 mg | Freq: Once | INTRAMUSCULAR | Status: AC
  Administered 2022-05-02: 15 mg via INTRAVENOUS
  Filled 2022-05-02: qty 1

## 2022-05-02 MED ORDER — TIZANIDINE HCL 2 MG PO TABS: 2.0000 mg | ORAL_TABLET | Freq: Three times a day (TID) | ORAL | Status: DC | PRN

## 2022-05-02 MED ORDER — MORPHINE SULFATE (PF) 4 MG/ML IV SOLN
4.0000 mg | INTRAVENOUS | Status: DC | PRN
  Administered 2022-05-02 – 2022-05-04 (×5): 4 mg via INTRAVENOUS
  Filled 2022-05-02 (×5): qty 1

## 2022-05-02 MED ORDER — LISINOPRIL 10 MG PO TABS
40.0000 mg | ORAL_TABLET | Freq: Every day | ORAL | Status: DC
Start: 2022-05-02 — End: 2022-05-02

## 2022-05-02 MED ORDER — DEXMEDETOMIDINE HCL IN NACL 200 MCG/50ML IV SOLN
INTRAVENOUS | Status: DC | PRN
  Administered 2022-05-02: 12 ug via INTRAVENOUS
  Administered 2022-05-02: 8 ug via INTRAVENOUS

## 2022-05-02 MED ORDER — DEXAMETHASONE SODIUM PHOSPHATE 10 MG/ML IJ SOLN
INTRAMUSCULAR | Status: DC | PRN
  Administered 2022-05-02: 10 mg via INTRAVENOUS

## 2022-05-02 MED ORDER — HYDROCODONE-ACETAMINOPHEN 5-325 MG PO TABS: 1.0000 | ORAL_TABLET | ORAL | Status: DC | PRN

## 2022-05-02 MED ORDER — KETAMINE HCL 10 MG/ML IJ SOLN
INTRAMUSCULAR | Status: DC | PRN
  Administered 2022-05-02: 30 mg via INTRAVENOUS

## 2022-05-02 MED ORDER — HYDROCODONE-ACETAMINOPHEN 5-325 MG PO TABS
1.0000 | ORAL_TABLET | ORAL | Status: DC | PRN
  Administered 2022-05-02 – 2022-05-03 (×2): 2 via ORAL
  Filled 2022-05-02 (×2): qty 2

## 2022-05-02 MED ORDER — PANCRELIPASE (LIP-PROT-AMYL) 12000-38000 UNITS PO CPEP
80000.0000 [IU] | ORAL_CAPSULE | Freq: Three times a day (TID) | ORAL | Status: DC
  Administered 2022-05-03 – 2022-05-09 (×17): 84000 [IU] via ORAL
  Filled 2022-05-02 (×9): qty 1
  Filled 2022-05-02 (×2): qty 7
  Filled 2022-05-02 (×3): qty 1
  Filled 2022-05-02: qty 7
  Filled 2022-05-02 (×2): qty 1
  Filled 2022-05-02: qty 7
  Filled 2022-05-02: qty 1
  Filled 2022-05-02: qty 7

## 2022-05-02 SURGICAL SUPPLY — 51 items
ADH SKN CLS APL DERMABOND .7 (GAUZE/BANDAGES/DRESSINGS) ×2
BAG PRESSURE INF REUSE 1000 (BAG) IMPLANT
COVER TIP SHEARS 8 DVNC (MISCELLANEOUS) ×2 IMPLANT
COVER WAND RF STERILE (DRAPES) ×2 IMPLANT
DERMABOND ADVANCED .7 DNX12 (GAUZE/BANDAGES/DRESSINGS) ×2 IMPLANT
DRAPE ARM DVNC X/XI (DISPOSABLE) ×6 IMPLANT
DRAPE COLUMN DVNC XI (DISPOSABLE) ×2 IMPLANT
DRSG OPSITE POSTOP 3X4 (GAUZE/BANDAGES/DRESSINGS) IMPLANT
DRSG OPSITE POSTOP 4X10 (GAUZE/BANDAGES/DRESSINGS) IMPLANT
ELECT REM PT RETURN 9FT ADLT (ELECTROSURGICAL) ×2
ELECTRODE REM PT RTRN 9FT ADLT (ELECTROSURGICAL) ×2 IMPLANT
FORCEPS BPLR R/ABLATION 8 DVNC (INSTRUMENTS) ×2 IMPLANT
GLOVE BIO SURGEON STRL SZ 6.5 (GLOVE) ×4 IMPLANT
GLOVE BIOGEL PI IND STRL 6.5 (GLOVE) ×4 IMPLANT
GOWN STRL REUS W/ TWL LRG LVL3 (GOWN DISPOSABLE) ×6 IMPLANT
GOWN STRL REUS W/TWL LRG LVL3 (GOWN DISPOSABLE) ×12
IRRIGATOR SUCT 8 DISP DVNC XI (IRRIGATION / IRRIGATOR) IMPLANT
IV NS 1000ML (IV SOLUTION)
IV NS 1000ML BAXH (IV SOLUTION) IMPLANT
KIT PINK PAD W/HEAD ARE REST (MISCELLANEOUS) ×2 IMPLANT
KIT PINK PAD W/HEAD ARM REST (MISCELLANEOUS) ×2 IMPLANT
LABEL OR SOLS (LABEL) ×2 IMPLANT
MANIFOLD NEPTUNE II (INSTRUMENTS) ×2 IMPLANT
NDL DRIVE SUT CUT DVNC (INSTRUMENTS) ×2 IMPLANT
NDL HYPO 22X1.5 SAFETY MO (MISCELLANEOUS) ×2 IMPLANT
NDL INSUFFLATION 14GA 120MM (NEEDLE) ×2 IMPLANT
NEEDLE DRIVE SUT CUT DVNC (INSTRUMENTS) ×2 IMPLANT
NEEDLE HYPO 22X1.5 SAFETY MO (MISCELLANEOUS) ×4 IMPLANT
NEEDLE INSUFFLATION 14GA 120MM (NEEDLE) ×2 IMPLANT
NS IRRIG 1000ML POUR BTL (IV SOLUTION) IMPLANT
NS IRRIG 500ML POUR BTL (IV SOLUTION) IMPLANT
OBTURATOR OPTICAL STND 8 DVNC (TROCAR) ×4
OBTURATOR OPTICALSTD 8 DVNC (TROCAR) ×2 IMPLANT
PACK BASIN MAJOR ARMC (MISCELLANEOUS) IMPLANT
PACK LAP CHOLECYSTECTOMY (MISCELLANEOUS) ×2 IMPLANT
SCISSORS MNPLR CVD DVNC XI (INSTRUMENTS) ×2 IMPLANT
SEAL UNIV 5-12 XI (MISCELLANEOUS) ×6 IMPLANT
SET TUBE SMOKE EVAC HIGH FLOW (TUBING) ×2 IMPLANT
SOL ELECTROSURG ANTI STICK (MISCELLANEOUS) ×2
SOLUTION ELECTROSURG ANTI STCK (MISCELLANEOUS) ×2 IMPLANT
SPONGE T-LAP 18X18 ~~LOC~~+RFID (SPONGE) IMPLANT
STAPLER SKIN PROX 35W (STAPLE) IMPLANT
SUT MNCRL AB 4-0 PS2 18 (SUTURE) ×2 IMPLANT
SUT PDS AB 0 CT1 27 (SUTURE) IMPLANT
SUT STRATAFIX PDS 30 CT-1 (SUTURE) ×2 IMPLANT
SUT V-LOC 90 ABS 3-0 VLT  V-20 (SUTURE)
SUT V-LOC 90 ABS 3-0 VLT V-20 (SUTURE) IMPLANT
TAPE TRANSPORE STRL 2 31045 (GAUZE/BANDAGES/DRESSINGS) ×2 IMPLANT
TRAP FLUID SMOKE EVACUATOR (MISCELLANEOUS) ×2 IMPLANT
TRAY FOLEY MTR SLVR 16FR STAT (SET/KITS/TRAYS/PACK) IMPLANT
WATER STERILE IRR 500ML POUR (IV SOLUTION) ×2 IMPLANT

## 2022-05-02 NOTE — Anesthesia Postprocedure Evaluation (Signed)
Anesthesia Post Note  Patient: Leslie Evans  Procedure(s) Performed: LYSIS OF ADHESION EXPLORATORY LAPAROTOMY (Abdomen)  Patient location during evaluation: PACU Anesthesia Type: General Level of consciousness: awake and alert Pain management: pain level controlled Vital Signs Assessment: post-procedure vital signs reviewed and stable Respiratory status: spontaneous breathing, nonlabored ventilation, respiratory function stable and patient connected to nasal cannula oxygen Cardiovascular status: blood pressure returned to baseline and stable Postop Assessment: no apparent nausea or vomiting Anesthetic complications: no   There were no known notable events for this encounter.   Last Vitals:  Vitals:   05/02/22 1706 05/02/22 1915  BP: (!) 142/83 (!) 157/89  Pulse: 66 61  Resp:  16  Temp: 36.5 C 36.9 C  SpO2: 99% 98%    Last Pain:  Vitals:   05/02/22 2100  TempSrc:   PainSc: 0-No pain                 Cleda Mccreedy Shunsuke Granzow

## 2022-05-02 NOTE — Telephone Encounter (Signed)
Patient states the pain got a little better after she called so she did not go to the ER. She states last night and this morning she states the pain got worse. Advised patient again to go to the ER. She states she will go this time she promised.

## 2022-05-02 NOTE — ED Notes (Signed)
Primary RN made aware pt coming to 5h and blood was not drawn in triage.

## 2022-05-02 NOTE — ED Provider Notes (Signed)
[facility] Provider Note   Event Date/Time   First MD Initiated Contact with Patient 05/02/22 [phone]     (approximate) History  Abdominal Pain  HPI Leslie Evans is a 67 y.o. female with a stated past medical history of ulcerative colitis who presents complaining of generalized anterior abdominal pain with associated nausea that has been present and worsening over the last week.  Patient now endorses 10/10, burning/aching midepigastric to periumbilical and right lower quadrant.  Patient also endorses intermittent nausea/vomiting/diarrhea over the last week.  Patient states last p.o. intake was at 7 AM and she has not vomited. ROS: Patient currently denies any vision changes, tinnitus, difficulty speaking, facial droop, sore throat, chest pain, shortness of breath, dysuria, or weakness/numbness/paresthesias in any extremity   Physical Exam  Triage Vital Signs: ED Triage Vitals [05/02/22 0928]  Enc Vitals Group     BP (!) 164/108     Pulse Rate 88     Resp 18     Temp 98.1 F (36.7 C)     Temp Source Oral     SpO2 99 %     Weight      Height      Head Circumference      Peak Flow      Pain Score 10     Pain Loc      Pain Edu?      Excl. in GC?    Most recent vital signs: Vitals:   05/03/22 0458 05/03/22 0735  BP: (!) 169/84 (!) 162/91  Pulse: (!) 59 (!) 59  Resp: 20   Temp: 98.5 F (36.9 C) 97.6 F (36.4 C)  SpO2: 99% 96%   General: Awake, oriented x4. CV:  Good peripheral perfusion.  Resp:  Normal effort.  Abd:  No distention.  Tenderness to palpation over mid epigastric region to right lower quadrant Other:  Elderly well-developed and well-nourished Caucasian female laying in bed in moderate distress secondary to pain ED Results / Procedures / Treatments  Labs (all labs ordered are listed, but only abnormal results are displayed) Labs Reviewed  COMPREHENSIVE METABOLIC PANEL - Abnormal; Notable for the following components:      Result  Value   Potassium 3.2 (*)    Glucose, Bld 137 (*)    All other components within normal limits  CBC - Abnormal; Notable for the following components:   Hemoglobin 15.9 (*)    All other components within normal limits  URINALYSIS, ROUTINE W REFLEX MICROSCOPIC - Abnormal; Notable for the following components:   Color, Urine STRAW (*)    APPearance CLEAR (*)    Specific Gravity, Urine 1.004 (*)    All other components within normal limits  CBC - Abnormal; Notable for the following components:   WBC 12.5 (*)    All other components within normal limits  BASIC METABOLIC PANEL - Abnormal; Notable for the following components:   Glucose, Bld 100 (*)    All other components within normal limits  SURGICAL PCR SCREEN  LIPASE, BLOOD   EKG ED ECG REPORT I, Merwyn Katos, the attending physician, personally viewed and interpreted this ECG. Date: 05/02/2022 EKG Time: 0930 Rate: 79 Rhythm: normal sinus rhythm QRS Axis: normal Intervals: normal ST/T Wave abnormalities: normal Narrative Interpretation: no evidence of acute ischemia RADIOLOGY ED MD interpretation: CT of the abdomen and pelvis with IV contrast interpreted independently by me and shows twisting the sigmoid colon and distal small bowel loops with associated mesenteric  edema with findings concerning for possible internal hernia Official radiology report(s): CT ABDOMEN PELVIS W CONTRAST  Result Date: 05/02/2022 CLINICAL DATA:  Lower quadrant pain EXAM: CT ABDOMEN AND PELVIS WITH CONTRAST TECHNIQUE: Multidetector CT imaging of the abdomen and pelvis was performed using the standard protocol following bolus administration of intravenous contrast. RADIATION DOSE REDUCTION: This exam was performed according to the departmental dose-optimization program which includes automated exposure control, adjustment of the mA and/or kV according to patient size and/or use of iterative reconstruction technique. CONTRAST:  OMNIPAQUE IOHEXOL 300  MG/ML  SOLN COMPARISON:  CT abdomen and pelvis dated October 15, 2020 FINDINGS: Lower chest: No acute abnormality. Hepatobiliary: No focal liver abnormality is seen. No gallstones, gallbladder wall thickening, or biliary dilatation. Pancreas: Unremarkable. No pancreatic ductal dilatation or surrounding inflammatory changes. Spleen: Normal in size without focal abnormality. Adrenals/Urinary Tract: Bilateral adrenal glands are unremarkable. No hydronephrosis nephrolithiasis. No suspicious renal lesions. Bladder is unremarkable. Stomach/Bowel: Twisting of the sigmoid colon and distal small bowel loops with associated mesenteric edema. Findings are concerning for internal hernia with probable neck in the mid upper pelvis. No evidence of pneumatosis. No evidence of obstruction. Vascular/Lymphatic: Aortic atherosclerosis. No enlarged abdominal or pelvic lymph nodes. Reproductive: No adnexal masses. Other: Trace abdominopelvic ascites.  No evidence of free air. Musculoskeletal: No aggressive appearing osseous lesions. IMPRESSION: 1. Twisting of the sigmoid colon and distal small bowel loops with associated mesenteric edema, findings are concerning for internal hernia with probable neck in the mid upper pelvis. No evidence of upstream obstruction. Mesenteric edema is concerning ischemia, although there is no evidence of pneumatosis or free air. 2. Trace abdominopelvic ascites. Critical Value/emergent results were called by telephone at the time of interpretation on 05/02/2022 at 11:42 am to provider [facility] , who verbally acknowledged these results. Electronically Signed   By: Allegra Lai M.D.   On: 05/02/2022 11:46   PROCEDURES: Critical Care performed: Yes, see critical care procedure note(s) .1-3 Lead EKG Interpretation  Performed by: Merwyn Katos, MD Authorized by: Merwyn Katos, MD     Interpretation: normal     ECG rate:  71   ECG rate assessment: normal     Rhythm: sinus rhythm     Ectopy:  none     Conduction: normal   CRITICAL CARE Performed by: Merwyn Katos  Total critical care time: 37 minutes  Critical care time was exclusive of separately billable procedures and treating other patients.  Critical care was necessary to treat or prevent imminent or life-threatening deterioration.  Critical care was time spent personally by me on the following activities: development of treatment plan with patient and/or surrogate as well as nursing, discussions with consultants, evaluation of patient's response to treatment, examination of patient, obtaining history from patient or surrogate, ordering and performing treatments and interventions, ordering and review of laboratory studies, ordering and review of radiographic studies, pulse oximetry and re-evaluation of patient's condition.  MEDICATIONS ORDERED IN ED: Medications  enoxaparin (LOVENOX) injection 40 mg (has no administration in time range)  0.9 %  sodium chloride infusion ( Intravenous Stopped 05/03/22 0421)  morphine (PF) 4 MG/ML injection 4 mg (4 mg Intravenous Given 05/03/22 0518)  ondansetron (ZOFRAN-ODT) disintegrating tablet 4 mg ( Oral See Alternative 05/03/22 0850)    Or  ondansetron (ZOFRAN) injection 4 mg (4 mg Intravenous Given 05/03/22 0850)  pantoprazole (PROTONIX) injection 40 mg (40 mg Intravenous Given 05/02/22 2212)  lipase/protease/amylase (CREON) capsule 84,000 Units (84,000 Units Oral Given  05/03/22 0814)  lipase/protease/amylase (CREON) capsule 36,000 Units (has no administration in time range)  oxyCODONE (Oxy IR/ROXICODONE) immediate release tablet 5 mg (5 mg Oral Given 05/03/22 0812)  sodium chloride 0.9 % bolus 1,000 mL (0 mLs Intravenous Stopped 05/02/22 1109)  ondansetron (ZOFRAN) injection 4 mg (4 mg Intravenous Given 05/02/22 0952)  iohexol (OMNIPAQUE) 300 MG/ML solution 100 mL (100 mLs Intravenous Contrast Given 05/02/22 1039)  ketorolac (TORADOL) 30 MG/ML injection 15 mg (15 mg Intravenous Given 05/02/22  1108)  chlorhexidine (PERIDEX) 0.12 % solution 15 mL (15 mLs Mouth/Throat Given 05/02/22 1343)    Or  Oral care mouth rinse ( Mouth Rinse See Alternative 05/02/22 1343)   IMPRESSION / MDM / ASSESSMENT AND PLAN / ED COURSE  I reviewed the triage vital signs and the nursing notes.                             The patient is on the cardiac monitor to evaluate for evidence of arrhythmia and/or significant heart rate changes. Patient's presentation is most consistent with acute presentation with potential threat to life or bodily function.  This patient presents to the ED for concern of abdominal pain, this involves an extensive number of treatment options, and is a complaint that carries with it a high risk of complications and morbidity.  The differential diagnosis includes pancreatitis, small bowel obstruction, UC flare, incarcerated hernia Co morbidities that complicate the patient evaluation  Ulcerative colitis, chronic pancreatitis Additional history obtained:  External records from outside source obtained and reviewed including recent ER visit on 09/09/2021 Lab Tests:  I Ordered, and personally interpreted labs.  The pertinent results include: Potassium 3.2 Imaging Studies ordered:  I ordered imaging studies including CT of the abdomen and pelvis with IV contrast  I independently visualized and interpreted imaging which showed twisting of the sigmoid and distal small intestine about a probable internal hernia  I agree with the radiologist interpretation Cardiac Monitoring: / EKG:  The patient was maintained on a cardiac monitor.  I personally viewed and interpreted the cardiac monitored which showed an underlying rhythm of: Normal sinus rhythm Consultations Obtained:  I requested consultation with the Dr. Maia Plan in general surgery,  and discussed lab and imaging findings as well as pertinent plan - they recommend: Admit to the OR Problem List / ED Course / Critical interventions /  Medication management  Internal hernia  I ordered medication including fentanyl and Zofran for antiemetic and analgesia  Reevaluation of the patient after these medicines showed that the patient improved  I have reviewed the patients home medicines and have made adjustments as needed Dispo: Admit to surgery       FINAL CLINICAL IMPRESSION(S) / ED DIAGNOSES   Final diagnoses:  Internal hernia  Intestinal obstruction, unspecified cause, unspecified whether partial or complete (HCC)   Rx / DC Orders   ED Discharge Orders     None      Note:  This document was prepared using Dragon voice recognition software and may include unintentional dictation errors.   Merwyn Katos, MD 05/03/22 [phone]

## 2022-05-02 NOTE — Op Note (Signed)
Preoperative diagnosis: Internal hernia  Postoperative diagnosis: Internal hernia of the transverse colon due to intestinal band evaluation  Procedure: Attempted diagnostic laparoscopy                     Exploratory laparotomy                     Extensive lysis of adhesion                      Anesthesia: GETA  Surgeon: Dr. Hazle Quant, MD  Wound Classification: Clean   Indications: Patient is a 67 y.o. 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 Resection was indicated for oncologic treatment.   Findings: 1.  Band of adhesion from terminal ileum and cecum and Cassini ring up and compressing the transverse colon causing a closed-loop obstruction 2.  GI track evaluated from stomach, duodenum, jejunum, and ileum, followed by ascending, transverse, descending, sigmoid colon and rectum without any other pathology identified.  Description of procedure:  The patient was placed in the supine position and general endotracheal anesthesia was induced. Sequential pneumatic compression devices were placed on lower extremities. Preoperative antibiotics were given. A Foley catheter and nasogastric tube were placed. The abdomen was prepped and draped in the usual sterile fashion. A time-out was completed verifying correct patient, procedure, site, positioning, and implant(s) and/or special equipment prior to beginning this procedure.  Small infraumbilical incision was done and Veress needle was inserted.  Pneumoperitoneum was achieved.  Trocar was placed in the infraumbilical incision.  A second trocar was placed at the right lower quadrant.  The patient was still with pain that the CO2 started tracking through the subcutaneous tissue.  Unable to prevent expansion of the CO2 to her subcutaneous tissue.  At this point we decided to abort laparoscopic procedure to avoid complication of elevated CO2 in her system. A vertical midline incision was made from  epigastric to just above the pubis. This was deepened through the subcutaneous tissues and hemostasis was achieved with electrocautery. The linea alba was identified and incised and the peritoneal cavity entered.  The abdomen was explored.  Multiple adhesions were seen causing a closed-loop obstruction of the transverse colon.  Difficult and extensive lysis of additions were done until the transverse colon was able to be freed and mobilized.  Once the adhesions were lysed, the colon was seen comfortably without any tension.  I elevated the transverse colon and inspected the small intestine from jejunum to ileum.  I also inspected the stomach and the first portion of the duodenum.  Then I also inspected the ascending colon transverse and descending colon.  No sign of ischemia was identified on the closed-loop obstruction segment.  Since no other pathology was identified, it was decided to close the abdomen. The fascia was closed with a running suture of PDS 0. The skin was closed with skin staplers.  The patient tolerated the procedure well and was taken to the postanesthesia care unit in stable condition.   Specimen: None  Complications: None  Estimated Blood Loss: 5 mL

## 2022-05-02 NOTE — ED Triage Notes (Addendum)
Pt to ED via POV from home. Pt reports epigastric pain, lower abdominal pain since Saturday that has been getting worse. Pt reports hx of pancreatitis, UC and ulcers. Pt reports N/V/D.

## 2022-05-02 NOTE — Consult Note (Signed)
SURGICAL CONSULTATION NOTE   HISTORY OF PRESENT ILLNESS (HPI):  67 y.o. female presented to [facility] ED for evaluation of abdominal pain. Patient reports she has been having worsening abdominal pain since Saturday.  She endorses that the pain was severe that she was bending over.  Pain localized to the suprapubic area.  Pain sometimes radiates to the upper abdomen.  She cannot identify any alleviating or aggravating factors.  The only thing that has control elevated with the pain is the pain medication at the ED today.  She endorses some nausea and vomiting as well.  She has been having small bowel movements.  At the ED she was found with tenderness to palpation in the lower abdomen.  CBC shows no leukocytosis.  Hemoglobin 15.9.  There is no significant electrolyte disturbance.  Adequate renal function.  Lipase within normal limits.  She had a CT scan of the abdomen and pelvis that shows twisting and edema of the sigmoid colon and distal small bowel.  There is concern for internal hernia.  Mesenteric edema concerning for ischemia.  Note patient has history of UC.  She has been followed by GI.  She has been control for more than 10 years.  She endorses being compliant with her medications.  Last colonoscopy 2 years ago.  Surgery is consulted by Dr. Vicente Males in this context for evaluation and management of suspected internal hernia.  PAST MEDICAL HISTORY (PMH):  Past Medical History:  Diagnosis Date   Anxiety    Asthma    Cervicalgia    COPD (chronic obstructive pulmonary disease) (HCC)    COVID-19    H/O: hysterectomy 2004   Hyperlipidemia    Hypertension      PAST SURGICAL HISTORY (PSH):  Past Surgical History:  Procedure Laterality Date   ABDOMINAL HYSTERECTOMY     COLONOSCOPY WITH PROPOFOL N/A 01/31/2020   Procedure: COLONOSCOPY WITH PROPOFOL;  Surgeon: Toney Reil, MD;  Location: [facility] ENDOSCOPY;  Service: Gastroenterology;  Laterality: N/A;   ESOPHAGOGASTRODUODENOSCOPY N/A  01/31/2020   Procedure: ESOPHAGOGASTRODUODENOSCOPY (EGD);  Surgeon: Toney Reil, MD;  Location: [facility] ENDOSCOPY;  Service: Gastroenterology;  Laterality: N/A;   TUBAL LIGATION       MEDICATIONS:  Prior to Admission medications   Medication Sig Start Date End Date Taking? Authorizing Provider  acetaminophen (TYLENOL) 500 MG tablet Take 500 mg by mouth every 6 (six) hours as needed.    [provider]  albuterol (VENTOLIN HFA) 108 (90 Base) MCG/ACT inhaler INHALE 2 PUFFS BY MOUTH EVERY 4 HOURS AS NEEDED FOR WHEEZING FOR SHORTNESS OF BREATH 01/02/22   Berniece Salines, FNP  budesonide (ENTOCORT EC) 3 MG 24 hr capsule Take 3 capsules (9 mg total) by mouth once daily. 06/27/21   Toney Reil, MD  budesonide-formoterol [facility]) 160-4.5 MCG/ACT inhaler Inhale 2 puffs into the lungs 2 (two) times daily. 12/14/20   Berniece Salines, FNP  citalopram (CELEXA) 40 MG tablet Take 1 tablet by mouth once daily 12/17/21   Berniece Salines, FNP  dicyclomine (BENTYL) 20 MG tablet Take 1 tablet (20 mg total) by mouth 3 (three) times daily as needed for spasms (abd cramping). 09/04/21   Berniece Salines, FNP  hydrOXYzine (VISTARIL) 25 MG capsule TAKE 1 TO 2 CAPSULES BY MOUTH TWICE DAILY AS NEEDED FOR INSOMNIA FOR ANXIETY 02/26/21   Danelle Berry, PA-C  lisinopril (ZESTRIL) 40 MG tablet Take 1 tablet by mouth once daily 02/26/22   Berniece Salines, FNP  metoCLOPramide (REGLAN) 10  MG tablet Take 1 tablet (10 mg total) by mouth every 6 (six) hours as needed for nausea or vomiting. 09/04/21   Berniece Salines, FNP  ondansetron (ZOFRAN ODT) 4 MG disintegrating tablet Dissolve 1-2 tablets (4-8 mg total) by mouth once every 8 (eight) hours as needed for nausea or vomiting. 09/09/21   Linwood Dibbles, MD  Pancrelipase, Lip-Prot-Amyl, (ZENPEP) [id number] units CPEP Take 2 capsules with the first bite of each meal and 1 capsule with the first bite of each snack 06/27/21   Toney Reil, MD  pantoprazole (PROTONIX)  40 MG tablet Take 1 tablet (40 mg total) by mouth daily. 09/09/21   Linwood Dibbles, MD  rosuvastatin (CRESTOR) 5 MG tablet Take 1 tablet by mouth once daily 01/02/22   Berniece Salines, FNP  sucralfate (CARAFATE) 1 g tablet Take 1 tablet (1 g total) by mouth 3 (three) times daily with meals as needed for GERD/reflux/epigastric pain when eating or drinking. 09/13/20   Toney Reil, MD  tiZANidine (ZANAFLEX) 4 MG tablet Take 0.5-1 tablets (2-4 mg total) by mouth every 8 (eight) hours as needed for muscle spasms (muscle tightness). 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 [place]   Tobacco Use   Smoking status: Every Day    Packs/day: .5    Types: Cigarettes   Smokeless tobacco: Never  Vaping Use   Vaping Use: Former   Substances: CBD  Substance and Sexual Activity   Alcohol use: Yes    Alcohol/week: 0.0 standard drinks of alcohol    Comment: occ   Drug use: No   Sexual activity: Not Currently  Other Topics Concern   Not on file  Social History Narrative   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[place],  Patient stated no insurance   Social Determinants of Health   Financial Resource Strain: High Risk (03/25/2018)   Overall Financial Resource Strain (CARDIA)    Difficulty of Paying Living Expenses: Hard  Food Insecurity: Food Insecurity Present (03/25/2018)   Hunger Vital Sign    Worried About Running Out of Food in the Last Year: Sometimes true    Ran Out of Food in the Last Year: Sometimes true  Transportation  Needs: No Transportation Needs (03/25/2018)   PRAPARE - Administrator, Civil Service (Medical): No    Lack of Transportation (Non-Medical): No  Physical Activity: Not on file  Stress: Not on file  Social Connections: Not on file  Intimate Partner Violence: Not on file      FAMILY HISTORY:  Family History  Problem Relation Age of Onset   Cancer Mother    Diabetes Mother    Heart disease Mother    Cancer Father    Drug abuse Brother    Diabetes Maternal Grandmother    COPD Maternal Grandfather    Cancer Brother      REVIEW OF SYSTEMS:  Constitutional: denies weight loss, fever, chills, or  sweats  Eyes: denies any other vision changes, history of eye injury  ENT: denies sore throat, hearing problems  Respiratory: denies shortness of breath, wheezing  Cardiovascular: denies chest pain, palpitations  Gastrointestinal: Positive abdominal pain, nausea and vomiting Genitourinary: denies burning with urination or urinary frequency Musculoskeletal: denies any other joint pains or cramps  Skin: denies any other rashes or skin discolorations  Neurological: denies any other headache, dizziness, weakness  Psychiatric: denies any other depression, anxiety   All other review of systems were negative   VITAL SIGNS:  Temp:  [98.1 F (36.7 C)] 98.1 F (36.7 C) (04/26 0928) Pulse Rate:  [88] 88 (04/26 0928) Resp:  [18] 18 (04/26 0928) BP: (164)/(108) 164/108 (04/26 0928) SpO2:  [99 %] 99 % (04/26 0928)             INTAKE/OUTPUT:  This shift: No intake/output data recorded.  Last 2 shifts: @IOLAST2SHIFTS @   PHYSICAL EXAM:  Constitutional:  -- Normal body habitus  -- Awake, alert, and oriented x3  Eyes:  -- Pupils equally round and reactive to light  -- No scleral icterus  Ear, nose, and throat:  -- No jugular venous distension  Pulmonary:  -- No crackles  -- Equal breath sounds bilaterally -- Breathing non-labored at rest Cardiovascular:  -- S1, S2 present  --  No pericardial rubs Gastrointestinal:  -- Abdomen soft, tender to palpation in the lower abdomen, non-distended, no guarding or rebound tenderness -- No abdominal masses appreciated, pulsatile or otherwise  Musculoskeletal and Integumentary:  -- Wounds: None appreciated -- Extremities: B/L UE and LE FROM, hands and feet warm, no edema  Neurologic:  -- Motor function: intact and symmetric -- Sensation: intact and symmetric   Labs:     Latest Ref Rng & Units 05/02/2022    9:55 AM 09/09/2021    7:38 AM 09/04/2021   11:43 AM  CBC  WBC 4.0 - 10.5 K/uL 6.5  11.5  9.9   Hemoglobin 12.0 - 15.0 g/dL 13.0  86.5  78.4   Hematocrit 36.0 - 46.0 % 44.3  43.6  43.9   Platelets 150 - 400 K/uL 243  284  327       Latest Ref Rng & Units 05/02/2022    9:55 AM 09/09/2021    7:38 AM 09/04/2021   11:43 AM  CMP  Glucose 70 - 99 mg/dL 696  295  87   BUN 8 - 23 mg/dL 11  10  7    Creatinine 0.44 - 1.00 mg/dL 2.84  1.32  4.40   Sodium 135 - 145 mmol/L 141  137  140   Potassium 3.5 - 5.1 mmol/L 3.2  3.7  3.8   Chloride 98 - 111 mmol/L 109  106  107   CO2 22 - 32 mmol/L 24  23  24    Calcium 8.9 - 10.3 mg/dL 9.8  9.8  9.7   Total Protein 6.5 - 8.1 g/dL 6.6  5.9  6.2   Total Bilirubin 0.3 - 1.2 mg/dL 0.7  0.4  0.5   Alkaline Phos 38 - 126 U/L 61  71    AST 15 - 41 U/L 17  13  7    ALT 0 - 44 U/L 13  10  6      Imaging studies:   I personally evaluated the images of the CT scan of the abdomen and pelvis today. EXAM: CT ABDOMEN AND PELVIS WITH CONTRAST   TECHNIQUE: Multidetector CT imaging of the abdomen and pelvis was  performed using the standard protocol following bolus administration of intravenous contrast.   RADIATION DOSE REDUCTION: This exam was performed according to the departmental dose-optimization program which includes automated exposure control, adjustment of the mA and/or kV according to patient size and/or use of iterative reconstruction technique.   CONTRAST:  OMNIPAQUE  IOHEXOL 300 MG/ML  SOLN   COMPARISON:  CT abdomen and pelvis dated October 15, 2020   FINDINGS: Lower chest: No acute abnormality.   Hepatobiliary: No focal liver abnormality is seen. No gallstones, gallbladder wall thickening, or biliary dilatation.   Pancreas: Unremarkable. No pancreatic ductal dilatation or surrounding inflammatory changes.   Spleen: Normal in size without focal abnormality.   Adrenals/Urinary Tract: Bilateral adrenal glands are unremarkable. No hydronephrosis nephrolithiasis. No suspicious renal lesions. Bladder is unremarkable.   Stomach/Bowel: Twisting of the sigmoid colon and distal small bowel loops with associated mesenteric edema. Findings are concerning for internal hernia with probable neck in the mid upper pelvis. No evidence of pneumatosis. No evidence of obstruction.   Vascular/Lymphatic: Aortic atherosclerosis. No enlarged abdominal or pelvic lymph nodes.   Reproductive: No adnexal masses.   Other: Trace abdominopelvic ascites.  No evidence of free air.   Musculoskeletal: No aggressive appearing osseous lesions.   IMPRESSION: 1. Twisting of the sigmoid colon and distal small bowel loops with associated mesenteric edema, findings are concerning for internal hernia with probable neck in the mid upper pelvis. No evidence of upstream obstruction. Mesenteric edema is concerning ischemia, although there is no evidence of pneumatosis or free air. 2. Trace abdominopelvic ascites.   Critical Value/emergent results were called by telephone at the time of interpretation on 05/02/2022 at 11:42 am to provider [facility] , who verbally acknowledged these results.     Electronically Signed   By: Allegra Lai M.D.   On: 05/02/2022 11:46  Assessment/Plan:  67 y.o. female with abdominal pain concerning for internal hernia or intestinal volvulus, complicated by pertinent comorbidities including ulcerative colitis.  Patient with initial  presentation of abdominal pain with chronic diarrhea and constipation.  CT scan today shows twisting of the sigmoid colon and ileum with concern of internal hernia with mesenteric edema concerning for early ischemia.  Due to the pain on the physical exam and the CT scan finding I discussed with patient recommendation of doing diagnostic laparoscopy for evaluation of the intestine.  Goal is to reduce the volvulus or internal hernia and fix the problem.  Discussed with patient possibility of bowel resection.  Also discussed with patient possibility of exploratory laparotomy.  Discussed with patient the risk of surgery including bleeding, infection, bowel perforation, injury to adjacent organs such as intestine, liver, bladder, kidneys, ureters, among others.  The patient reports she understood and agreed to proceed with plan.  Gae Gallop, MD

## 2022-05-02 NOTE — Anesthesia Preprocedure Evaluation (Signed)
Anesthesia Evaluation  Patient identified by MRN, date of birth, ID band Patient awake    Reviewed: Allergy & Precautions, NPO status , Patient's Chart, lab work & pertinent test results  History of Anesthesia Complications Negative for: history of anesthetic complications  Airway Mallampati: I  TM Distance: >3 FB Neck ROM: Full    Dental  (+) Missing, Chipped   Pulmonary asthma , neg sleep apnea, COPD,  COPD inhaler, Current SmokerPatient did not abstain from smoking. Breathing feels at baseline   Pulmonary exam normal breath sounds clear to auscultation       Cardiovascular Exercise Tolerance: Good METShypertension, Pt. on medications (-) CAD and (-) Past MI (-) dysrhythmias  Rhythm:Regular Rate:Normal - Systolic murmurs    Neuro/Psych  PSYCHIATRIC DISORDERS Anxiety     negative neurological ROS     GI/Hepatic ,neg GERD  ,,(+)     (-) substance abuse  Hx recurring pancreatitis.  CT scan today: IMPRESSION: 1. Twisting of the sigmoid colon and distal small bowel loops with associated mesenteric edema, findings are concerning for internal hernia with probable neck in the mid upper pelvis. No evidence of upstream obstruction. Mesenteric edema is concerning ischemia, although there is no evidence of pneumatosis or free air. 2. Trace abdominopelvic ascites.    Endo/Other  neg diabetes    Renal/GU negative Renal ROS     Musculoskeletal   Abdominal   Peds  Hematology   Anesthesia Other Findings Past Medical History: No date: Anxiety No date: Asthma No date: Cervicalgia No date: COPD (chronic obstructive pulmonary disease) (HCC) No date: COVID-19 2004: H/O: hysterectomy No date: Hyperlipidemia No date: Hypertension  Reproductive/Obstetrics                              Anesthesia Physical Anesthesia Plan  ASA: 3 and emergent  Anesthesia Plan: General   Post-op Pain  Management: Ofirmev IV (intra-op)*   Induction: Intravenous and Rapid sequence  PONV Risk Score and Plan: 4 or greater and Ondansetron, Dexamethasone and Midazolam  Airway Management Planned: Oral ETT and Video Laryngoscope Planned  Additional Equipment: None  Intra-op Plan:   Post-operative Plan: Extubation in OR  Informed Consent: I have reviewed the patients History and Physical, chart, labs and discussed the procedure including the risks, benefits and alternatives for the proposed anesthesia with the patient or authorized representative who has indicated his/her understanding and acceptance.     Dental advisory given  Plan Discussed with: CRNA and Surgeon  Anesthesia Plan Comments: (Patient had meal (toast with homemade blackberry jam) at 730am, had a lot of nausea with it. Endorses nausea and vomiting. I spoke with surgeon Dr Hazle Quant who deems this case emergent in light of the inappropriate NPO status. Discussed risks of anesthesia with patient, including PONV, sore throat, aspiration, lip/dental/eye damage. Rare risks discussed as well, such as cardiorespiratory and neurological sequelae, and allergic reactions. Discussed the role of CRNA in patient's perioperative care. Patient understands. Patient counseled on benefits of smoking cessation, and increased perioperative risks associated with continued smoking. )         Anesthesia Quick Evaluation

## 2022-05-02 NOTE — Telephone Encounter (Signed)
Pt is still having severe abdominal pain and cramping  pt states that she can not stand up straight without pain symptoms started 04/26/2022 please return call

## 2022-05-02 NOTE — H&P (Signed)
SURGICAL HISTORY AND PHYSICAL NOTE    HISTORY OF PRESENT ILLNESS (HPI):  67 y.o. female presented to [facility] ED for evaluation of abdominal pain. Patient reports she has been having worsening abdominal pain since Saturday.  She endorses that the pain was severe that she was bending over.  Pain localized to the suprapubic area.  Pain sometimes radiates to the upper abdomen.  She cannot identify any alleviating or aggravating factors.  The only thing that has control elevated with the pain is the pain medication at the ED today.  She endorses some nausea and vomiting as well.  She has been having small bowel movements.   At the ED she was found with tenderness to palpation in the lower abdomen.  CBC shows no leukocytosis.  Hemoglobin 15.9.  There is no significant electrolyte disturbance.  Adequate renal function.  Lipase within normal limits.  She had a CT scan of the abdomen and pelvis that shows twisting and edema of the sigmoid colon and distal small bowel.  There is concern for internal hernia.  Mesenteric edema concerning for ischemia.   Note patient has history of UC.  She has been followed by GI.  She has been control for more than 10 years.  She endorses being compliant with her medications.  Last colonoscopy 2 years ago.   Surgery is consulted by Dr. Vicente Males in this context for evaluation and management of suspected internal hernia.   PAST MEDICAL HISTORY (PMH):      Past Medical History:  Diagnosis Date   Anxiety     Asthma     Cervicalgia     COPD (chronic obstructive pulmonary disease) (HCC)     COVID-19     H/O: hysterectomy 2004   Hyperlipidemia     Hypertension        PAST SURGICAL HISTORY (PSH):       Past Surgical History:  Procedure Laterality Date   ABDOMINAL HYSTERECTOMY       COLONOSCOPY WITH PROPOFOL N/A 01/31/2020    Procedure: COLONOSCOPY WITH PROPOFOL;  Surgeon: Toney Reil, MD;  Location: [facility] ENDOSCOPY;  Service: Gastroenterology;  Laterality: N/A;    ESOPHAGOGASTRODUODENOSCOPY N/A 01/31/2020    Procedure: ESOPHAGOGASTRODUODENOSCOPY (EGD);  Surgeon: Toney Reil, MD;  Location: [facility] ENDOSCOPY;  Service: Gastroenterology;  Laterality: N/A;   TUBAL LIGATION          MEDICATIONS:         Prior to Admission medications   Medication Sig Start Date End Date Taking? Authorizing Provider  acetaminophen (TYLENOL) 500 MG tablet Take 500 mg by mouth every 6 (six) hours as needed.       [provider]  albuterol (VENTOLIN HFA) 108 (90 Base) MCG/ACT inhaler INHALE 2 PUFFS BY MOUTH EVERY 4 HOURS AS NEEDED FOR WHEEZING FOR SHORTNESS OF BREATH 01/02/22     Berniece Salines, FNP  budesonide (ENTOCORT EC) 3 MG 24 hr capsule Take 3 capsules (9 mg total) by mouth once daily. 06/27/21     Toney Reil, MD  budesonide-formoterol [facility]) 160-4.5 MCG/ACT inhaler Inhale 2 puffs into the lungs 2 (two) times daily. 12/14/20     Berniece Salines, FNP  citalopram (CELEXA) 40 MG tablet Take 1 tablet by mouth once daily 12/17/21     Berniece Salines, FNP  dicyclomine (BENTYL) 20 MG tablet Take 1 tablet (20 mg total) by mouth 3 (three) times daily as needed for spasms (abd cramping). 09/04/21     Zane Herald,  Rudolpho Sevin, FNP  hydrOXYzine (VISTARIL) 25 MG capsule TAKE 1 TO 2 CAPSULES BY MOUTH TWICE DAILY AS NEEDED FOR INSOMNIA FOR ANXIETY 02/26/21     Danelle Berry, PA-C  lisinopril (ZESTRIL) 40 MG tablet Take 1 tablet by mouth once daily 02/26/22     Berniece Salines, FNP  metoCLOPramide (REGLAN) 10 MG tablet Take 1 tablet (10 mg total) by mouth every 6 (six) hours as needed for nausea or vomiting. 09/04/21     Berniece Salines, FNP  ondansetron (ZOFRAN ODT) 4 MG disintegrating tablet Dissolve 1-2 tablets (4-8 mg total) by mouth once every 8 (eight) hours as needed for nausea or vomiting. 09/09/21     Linwood Dibbles, MD  Pancrelipase, Lip-Prot-Amyl, (ZENPEP) [phone] units CPEP Take 2 capsules with the first bite of each meal and 1 capsule with the first bite of each  snack 06/27/21     Toney Reil, MD  pantoprazole (PROTONIX) 40 MG tablet Take 1 tablet (40 mg total) by mouth daily. 09/09/21     Linwood Dibbles, MD  rosuvastatin (CRESTOR) 5 MG tablet Take 1 tablet by mouth once daily 01/02/22     Berniece Salines, FNP  sucralfate (CARAFATE) 1 g tablet Take 1 tablet (1 g total) by mouth 3 (three) times daily with meals as needed for GERD/reflux/epigastric pain when eating or drinking. 09/13/20     Toney Reil, MD  tiZANidine (ZANAFLEX) 4 MG tablet Take 0.5-1 tablets (2-4 mg total) by mouth every 8 (eight) hours as needed for muscle spasms (muscle tightness). 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 [place]   Tobacco Use   Smoking status: Every Day      Packs/day: .5      Types: Cigarettes   Smokeless tobacco: Never  Vaping Use   Vaping Use: Former   Substances: CBD  Substance and Sexual Activity   Alcohol use: Yes      Alcohol/week: 0.0 standard drinks of alcohol      Comment: occ   Drug use: No   Sexual activity: Not Currently  Other Topics Concern   Not on file  Social History Narrative    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[place],  Patient stated no insurance    Social Determinants of Health        Financial Resource Strain: High Risk (03/25/2018)    Overall Financial Resource Strain (CARDIA)     Difficulty of Paying Living Expenses: Hard  Food Insecurity: Food Insecurity Present (03/25/2018)    Hunger Vital Sign     Worried About Running  Out of Food in the Last Year: Sometimes true     Ran Out of Food in the Last Year: Sometimes true  Transportation Needs: No Transportation Needs (03/25/2018)    PRAPARE - Transportation     Lack of  Transportation (Medical): No     Lack of Transportation (Non-Medical): No  Physical Activity: Not on file  Stress: Not on file  Social Connections: Not on file  Intimate Partner Violence: Not on file        FAMILY HISTORY:       Family History  Problem Relation Age of Onset   Cancer Mother     Diabetes Mother     Heart disease Mother     Cancer Father     Drug abuse Brother     Diabetes Maternal Grandmother     COPD Maternal Grandfather     Cancer Brother        REVIEW OF SYSTEMS:  Constitutional: denies weight loss, fever, chills, or sweats  Eyes: denies any other vision changes, history of eye injury  ENT: denies sore throat, hearing problems  Respiratory: denies shortness of breath, wheezing  Cardiovascular: denies chest pain, palpitations  Gastrointestinal: Positive abdominal pain, nausea and vomiting Genitourinary: denies burning with urination or urinary frequency Musculoskeletal: denies any other joint pains or cramps  Skin: denies any other rashes or skin discolorations  Neurological: denies any other headache, dizziness, weakness  Psychiatric: denies any other depression, anxiety    All other review of systems were negative    VITAL SIGNS:  Temp:  [98.1 F (36.7 C)] 98.1 F (36.7 C) (04/26 0928) Pulse Rate:  [88] 88 (04/26 0928) Resp:  [18] 18 (04/26 0928) BP: (164)/(108) 164/108 (04/26 0928) SpO2:  [99 %] 99 % (04/26 0928)              INTAKE/OUTPUT:  This shift: No intake/output data recorded.  Last 2 shifts: @IOLAST2SHIFTS @    PHYSICAL EXAM:  Constitutional:  -- Normal body habitus  -- Awake, alert, and oriented x3  Eyes:  -- Pupils equally round and reactive to light  -- No scleral icterus  Ear, nose, and throat:  -- No jugular venous distension   Pulmonary:  -- No crackles  -- Equal breath sounds bilaterally -- Breathing non-labored at rest Cardiovascular:  -- S1, S2 present  -- No pericardial rubs Gastrointestinal:  -- Abdomen soft, tender to palpation in the lower abdomen, non-distended, no guarding or rebound tenderness -- No abdominal masses appreciated, pulsatile or otherwise  Musculoskeletal and Integumentary:  -- Wounds: None appreciated -- Extremities: B/L UE and LE FROM, hands and feet warm, no edema  Neurologic:  -- Motor function: intact and symmetric -- Sensation: intact and symmetric     Labs:      Latest Ref Rng & Units 05/02/2022    9:55 AM 09/09/2021    7:38 AM 09/04/2021   11:43 AM  CBC  WBC 4.0 - 10.5 K/uL 6.5  11.5  9.9   Hemoglobin 12.0 - 15.0 g/dL 40.9  81.1  91.4   Hematocrit 36.0 - 46.0 % 44.3  43.6  43.9   Platelets 150 - 400 K/uL 243  284  327         Latest Ref Rng & Units 05/02/2022    9:55 AM 09/09/2021    7:38 AM 09/04/2021   11:43 AM  CMP  Glucose 70 - 99 mg/dL 782  956  87   BUN 8 - 23 mg/dL 11  10  7    Creatinine 0.44 - 1.00 mg/dL 2.13  0.86  5.78   Sodium 135 - 145 mmol/L 141  137  140   Potassium 3.5 - 5.1 mmol/L 3.2  3.7  3.8   Chloride 98 -  111 mmol/L 109  106  107   CO2 22 - 32 mmol/L 24  23  24    Calcium 8.9 - 10.3 mg/dL 9.8  9.8  9.7   Total Protein 6.5 - 8.1 g/dL 6.6  5.9  6.2   Total Bilirubin 0.3 - 1.2 mg/dL 0.7  0.4  0.5   Alkaline Phos 38 - 126 U/L 61  71     AST 15 - 41 U/L 17  13  7    ALT 0 - 44 U/L 13  10  6        Imaging studies:    I personally evaluated the images of the CT scan of the abdomen and pelvis today. EXAM: CT ABDOMEN AND PELVIS WITH CONTRAST   TECHNIQUE: Multidetector CT imaging of the abdomen and pelvis was performed using the standard protocol following bolus administration of intravenous contrast.   RADIATION DOSE REDUCTION: This exam was performed according to the departmental dose-optimization program which includes automated exposure  control, adjustment of the mA and/or kV according to patient size and/or use of iterative reconstruction technique.   CONTRAST:  OMNIPAQUE IOHEXOL 300 MG/ML  SOLN   COMPARISON:  CT abdomen and pelvis dated October 15, 2020   FINDINGS: Lower chest: No acute abnormality.   Hepatobiliary: No focal liver abnormality is seen. No gallstones, gallbladder wall thickening, or biliary dilatation.   Pancreas: Unremarkable. No pancreatic ductal dilatation or surrounding inflammatory changes.   Spleen: Normal in size without focal abnormality.   Adrenals/Urinary Tract: Bilateral adrenal glands are unremarkable. No hydronephrosis nephrolithiasis. No suspicious renal lesions. Bladder is unremarkable.   Stomach/Bowel: Twisting of the sigmoid colon and distal small bowel loops with associated mesenteric edema. Findings are concerning for internal hernia with probable neck in the mid upper pelvis. No evidence of pneumatosis. No evidence of obstruction.   Vascular/Lymphatic: Aortic atherosclerosis. No enlarged abdominal or pelvic lymph nodes.   Reproductive: No adnexal masses.   Other: Trace abdominopelvic ascites.  No evidence of free air.   Musculoskeletal: No aggressive appearing osseous lesions.   IMPRESSION: 1. Twisting of the sigmoid colon and distal small bowel loops with associated mesenteric edema, findings are concerning for internal hernia with probable neck in the mid upper pelvis. No evidence of upstream obstruction. Mesenteric edema is concerning ischemia, although there is no evidence of pneumatosis or free air. 2. Trace abdominopelvic ascites.   Critical Value/emergent results were called by telephone at the time of interpretation on 05/02/2022 at 11:42 am to provider [facility] , who verbally acknowledged these results.     Electronically Signed   By: Allegra Lai M.D.   On: 05/02/2022 11:46   Assessment/Plan:  67 y.o. female with abdominal pain  concerning for internal hernia or intestinal volvulus, complicated by pertinent comorbidities including ulcerative colitis.   Patient with initial presentation of abdominal pain with chronic diarrhea and constipation.  CT scan today shows twisting of the sigmoid colon and ileum with concern of internal hernia with mesenteric edema concerning for early ischemia.  Due to the pain on the physical exam and the CT scan finding I discussed with patient recommendation of doing diagnostic laparoscopy for evaluation of the intestine.  Goal is to reduce the volvulus or internal hernia and fix the problem.  Discussed with patient possibility of bowel resection.  Also discussed with patient possibility of exploratory laparotomy.  Discussed with patient the risk of surgery including bleeding, infection, bowel perforation, injury to adjacent organs such as intestine, liver, bladder, kidneys,  ureters, among others.  The patient reports she understood and agreed to proceed with plan.   Gae Gallop, MD

## 2022-05-02 NOTE — Transfer of Care (Signed)
Immediate Anesthesia Transfer of Care Note  Patient: Leslie Evans  Procedure(s) Performed: LYSIS OF ADHESION EXPLORATORY LAPAROTOMY (Abdomen)  Patient Location: PACU  Anesthesia Type:General  Level of Consciousness: awake, alert , and oriented  Airway & Oxygen Therapy: Patient Spontanous Breathing  Post-op Assessment: Report given to RN and Post -op Vital signs reviewed and stable  Post vital signs: stable  Last Vitals:  Vitals Value Taken Time  BP 138/98 05/02/22 1601  Temp    Pulse 60 05/02/22 1607  Resp 21 05/02/22 1607  SpO2 97 % 05/02/22 1607  Vitals shown include unvalidated device data.  Last Pain:  Vitals:   05/02/22 1320  TempSrc: Temporal  PainSc: 3          Complications: No notable events documented.

## 2022-05-02 NOTE — Anesthesia Procedure Notes (Signed)
Procedure Name: Intubation Date/Time: 05/02/2022 2:39 PM  Performed by: Maryla Morrow., CRNAPre-anesthesia Checklist: Patient identified, Patient being monitored, Timeout performed, Emergency Drugs available and Suction available Patient Re-evaluated:Patient Re-evaluated prior to induction Oxygen Delivery Method: Circle system utilized Preoxygenation: Pre-oxygenation with 100% oxygen Induction Type: IV induction Ventilation: Mask ventilation without difficulty Laryngoscope Size: McGraph and 4 Grade View: Grade I Tube type: Oral Tube size: 7.0 mm Number of attempts: 1 Airway Equipment and Method: Stylet Placement Confirmation: ETT inserted through vocal cords under direct vision, positive ETCO2 and breath sounds checked- equal and bilateral Secured at: 19 cm Tube secured with: Tape Dental Injury: Teeth and Oropharynx as per pre-operative assessment

## 2022-05-03 ENCOUNTER — Encounter: Payer: Self-pay | Admitting: General Surgery

## 2022-05-03 LAB — BASIC METABOLIC PANEL
Anion gap: 5 (ref 5–15)
BUN: 11 mg/dL (ref 8–23)
CO2: 23 mmol/L (ref 22–32)
Calcium: 9.1 mg/dL (ref 8.9–10.3)
Chloride: 109 mmol/L (ref 98–111)
Creatinine, Ser: 0.55 mg/dL (ref 0.44–1.00)
GFR, Estimated: >60 mL/min (ref >60)
Glucose, Bld: 100 mg/dL — ABNORMAL HIGH (ref 70–99)
Potassium: 3.5 mmol/L (ref 3.5–5.1)
Sodium: 137 mmol/L (ref 135–145)

## 2022-05-03 LAB — CBC
HCT: 39.1 % (ref 36.0–46.0)
Hemoglobin: 13.8 g/dL (ref 12.0–15.0)
MCH: 31.1 pg (ref 26.0–34.0)
MCHC: 35.3 g/dL (ref 30.0–36.0)
MCV: 88.1 fL (ref 80.0–100.0)
Platelets: 187 10*3/uL (ref 150–400)
RBC: 4.44 MIL/uL (ref 3.87–5.11)
RDW: 12.9 % (ref 11.5–15.5)
WBC: 12.5 10*3/uL — ABNORMAL HIGH (ref 4.0–10.5)
nRBC: 0 % (ref 0.0–0.2)

## 2022-05-03 MED ORDER — METHOCARBAMOL 500 MG PO TABS
500.0000 mg | ORAL_TABLET | Freq: Three times a day (TID) | ORAL | Status: DC | PRN
  Administered 2022-05-03 – 2022-05-08 (×6): 500 mg via ORAL
  Filled 2022-05-03 (×6): qty 1

## 2022-05-03 MED ORDER — LISINOPRIL 20 MG PO TABS
40.0000 mg | ORAL_TABLET | Freq: Every day | ORAL | Status: DC
  Administered 2022-05-03 – 2022-05-09 (×7): 40 mg via ORAL
  Filled 2022-05-03 (×7): qty 2

## 2022-05-03 MED ORDER — OXYCODONE HCL 5 MG PO TABS
5.0000 mg | ORAL_TABLET | ORAL | Status: DC | PRN
  Administered 2022-05-03 – 2022-05-09 (×25): 5 mg via ORAL
  Filled 2022-05-03 (×25): qty 1

## 2022-05-03 NOTE — Progress Notes (Signed)
Patient ID: Leslie Evans, female   DOB: [birth year], 67 y.o.   MRN: [id number]     SURGICAL PROGRESS NOTE   Hospital Day(s): 1.   Interval History: Patient seen and examined, no acute events or new complaints overnight. Patient reports feeling "pretty good".  She did have some pain during the morning that was controlled with oxycodone.  She still not passing gas or having any bowel movements.  No significant nausea with the diet either.  Vital signs in last 24 hours: [min-max] current  Temp:  [97.6 F (36.4 C)-99.2 F (37.3 C)] 97.6 F (36.4 C) (04/27 0735) Pulse Rate:  [56-66] 59 (04/27 0735) Resp:  [12-22] 20 (04/27 0458) BP: (132-169)/(75-98) 162/91 (04/27 0735) SpO2:  [94 %-99 %] 96 % (04/27 0735) Weight:  [51.4 kg] 51.4 kg (04/26 1320)     Height: 5\' 5"  (165.1 cm) Weight: 51.4 kg BMI (Calculated): 18.87   Physical Exam:  Constitutional: alert, cooperative and no distress  Respiratory: breathing non-labored at rest  Cardiovascular: regular rate and sinus rhythm  Gastrointestinal: soft, tender in the upper abdomen, and non-distended  Labs:     Latest Ref Rng & Units 05/03/2022    5:45 AM 05/02/2022    9:55 AM 09/09/2021    7:38 AM  CBC  WBC 4.0 - 10.5 K/uL 12.5  6.5  11.5   Hemoglobin 12.0 - 15.0 g/dL 84.1  32.4  40.1   Hematocrit 36.0 - 46.0 % 39.1  44.3  43.6   Platelets 150 - 400 K/uL 187  243  284       Latest Ref Rng & Units 05/03/2022    5:45 AM 05/02/2022    9:55 AM 09/09/2021    7:38 AM  CMP  Glucose 70 - 99 mg/dL 027  253  664   BUN 8 - 23 mg/dL 11  11  10    Creatinine 0.44 - 1.00 mg/dL 4.03  4.74  2.59   Sodium 135 - 145 mmol/L 137  141  137   Potassium 3.5 - 5.1 mmol/L 3.5  3.2  3.7   Chloride 98 - 111 mmol/L 109  109  106   CO2 22 - 32 mmol/L 23  24  23    Calcium 8.9 - 10.3 mg/dL 9.1  9.8  9.8   Total Protein 6.5 - 8.1 g/dL  6.6  5.9   Total Bilirubin 0.3 - 1.2 mg/dL  0.7  0.4   Alkaline Phos 38 - 126 U/L  61  71   AST 15 - 41 U/L  17  13   ALT 0 - 44 U/L   13  10     Imaging studies: No new pertinent imaging studies   Assessment/Plan:  67 y.o. female with large intestinal closed-loop obstruction 1 Day Post-Op s/p lysis of adhesion, complicated by pertinent comorbidities including obesity, chronic pancreatic insufficiency, hypertension.  -Stable postop day #1 -Expected slow recovery.  No gas or bowel movement today.  No nausea either.  Will continue with current diet. -Encourage patient to ambulate -Continue pain management for pain control -Lovenox for DVT prophylaxis   Gae Gallop, MD

## 2022-05-03 NOTE — Plan of Care (Signed)
  Problem: Health Behavior/Discharge Planning: Goal: Ability to manage health-related needs will improve Outcome: Progressing   Problem: Clinical Measurements: Goal: Ability to maintain clinical measurements within normal limits will improve Outcome: Progressing  Patient has ambulated x2 in hallway, tolerating well.  Pain more effectively controlled with change to oxycodone

## 2022-05-04 LAB — CBC
HCT: 34.5 % — ABNORMAL LOW (ref 36.0–46.0)
Hemoglobin: 12 g/dL (ref 12.0–15.0)
MCH: 30.8 pg (ref 26.0–34.0)
MCHC: 34.8 g/dL (ref 30.0–36.0)
MCV: 88.5 fL (ref 80.0–100.0)
Platelets: 165 10*3/uL (ref 150–400)
RBC: 3.9 MIL/uL (ref 3.87–5.11)
RDW: 13.2 % (ref 11.5–15.5)
WBC: 6.3 10*3/uL (ref 4.0–10.5)
nRBC: 0 % (ref 0.0–0.2)

## 2022-05-04 LAB — BASIC METABOLIC PANEL
Anion gap: 4 — ABNORMAL LOW (ref 5–15)
BUN: 8 mg/dL (ref 8–23)
CO2: 24 mmol/L (ref 22–32)
Calcium: 8.9 mg/dL (ref 8.9–10.3)
Chloride: 111 mmol/L (ref 98–111)
Creatinine, Ser: 0.59 mg/dL (ref 0.44–1.00)
GFR, Estimated: >60 mL/min (ref >60)
Glucose, Bld: 88 mg/dL (ref 70–99)
Potassium: 3 mmol/L — ABNORMAL LOW (ref 3.5–5.1)
Sodium: 139 mmol/L (ref 135–145)

## 2022-05-04 MED ORDER — PANTOPRAZOLE SODIUM 40 MG IV SOLR
40.0000 mg | Freq: Every day | INTRAVENOUS | Status: DC
  Administered 2022-05-04 – 2022-05-06 (×3): 40 mg via INTRAVENOUS
  Filled 2022-05-04 (×3): qty 10

## 2022-05-04 MED ORDER — CHEWING GUM (ORBIT) SUGAR FREE: 1.0000 | CHEWING_GUM | ORAL | Status: DC | PRN

## 2022-05-04 MED ORDER — PANTOPRAZOLE SODIUM 40 MG PO TBEC: 40.0000 mg | DELAYED_RELEASE_TABLET | Freq: Every day | ORAL | Status: DC

## 2022-05-04 NOTE — Progress Notes (Signed)
PHARMACIST - PHYSICIAN COMMUNICATION  CONCERNING: IV to Oral Route Change Policy  RECOMMENDATION: This patient is receiving pantoprazole by the intravenous route.  Based on criteria approved by the Pharmacy and Therapeutics Committee, the intravenous medication(s) is/are being converted to the equivalent oral dose form(s).  DESCRIPTION: These criteria include: The patient is eating (either orally or via tube) and/or has been taking other orally administered medications for a least 24 hours The patient has no evidence of active gastrointestinal bleeding or impaired GI absorption (gastrectomy, short bowel, patient on TNA or NPO).  If you have questions about this conversion, please contact the Pharmacy Department   Tressie Ellis, [facility] 05/04/2022 10:31 AM

## 2022-05-04 NOTE — Progress Notes (Signed)
Patient ID: Leslie Evans, female   DOB: 98/01/1912, 67 y.o.   MRN: [id number]     SURGICAL PROGRESS NOTE   Hospital Day(s): 2.   Interval History: Patient seen and examined, no acute events or new complaints overnight. Patient reports having a little bit of progress.  Still passing gas.  Abdominal pain continues to be controlled with current pain medications.  No nausea or vomiting.  Vital signs in last 24 hours: [min-max] current  Temp:  [96.5 F (35.8 C)-99.1 F (37.3 C)] 99.1 F (37.3 C) (04/28 0739) Pulse Rate:  [55-60] 55 (04/28 0739) Resp:  [16-20] 16 (04/28 0431) BP: (138-157)/(72-94) 138/72 (04/28 0739) SpO2:  [96 %-99 %] 96 % (04/28 0739)     Height: 5\' 5"  (165.1 cm) Weight: 51.4 kg BMI (Calculated): 18.87   Physical Exam:  Constitutional: alert, cooperative and no distress  Respiratory: breathing non-labored at rest  Cardiovascular: regular rate and sinus rhythm  Gastrointestinal: soft, mildly tender, and mildly-distended  Labs:     Latest Ref Rng & Units 05/04/2022    5:52 AM 05/03/2022    5:45 AM 05/02/2022    9:55 AM  CBC  WBC 4.0 - 10.5 K/uL 6.3  12.5  6.5   Hemoglobin 12.0 - 15.0 g/dL 08.6  57.8  46.9   Hematocrit 36.0 - 46.0 % 34.5  39.1  44.3   Platelets 150 - 400 K/uL 165  187  243       Latest Ref Rng & Units 05/04/2022    5:52 AM 05/03/2022    5:45 AM 05/02/2022    9:55 AM  CMP  Glucose 70 - 99 mg/dL 88  629  528   BUN 8 - 23 mg/dL 8  11  11    Creatinine 0.44 - 1.00 mg/dL 4.13  2.44  0.10   Sodium 135 - 145 mmol/L 139  137  141   Potassium 3.5 - 5.1 mmol/L 3.0  3.5  3.2   Chloride 98 - 111 mmol/L 111  109  109   CO2 22 - 32 mmol/L 24  23  24    Calcium 8.9 - 10.3 mg/dL 8.9  9.1  9.8   Total Protein 6.5 - 8.1 g/dL   6.6   Total Bilirubin 0.3 - 1.2 mg/dL   0.7   Alkaline Phos 38 - 126 U/L   61   AST 15 - 41 U/L   17   ALT 0 - 44 U/L   13     Imaging studies: No new pertinent imaging studies   Assessment/Plan:  67 y.o. female with large intestinal  closed-loop obstruction 2 Day Post-Op s/p lysis of adhesion, complicated by pertinent comorbidities including obesity, chronic pancreatic insufficiency, hypertension.   -Continue recovering slowly -Stable vital signs without fever. -Continue with suspected postoperative ileus.  No nausea or vomiting. -Encouraged the patient to mobilize -Continue pain management  Gae Gallop, MD

## 2022-05-05 ENCOUNTER — Other Ambulatory Visit: Payer: Self-pay

## 2022-05-05 MED ORDER — POTASSIUM CHLORIDE 10 MEQ/100ML IV SOLN
10.0000 meq | INTRAVENOUS | Status: AC
  Administered 2022-05-05 (×4): 10 meq via INTRAVENOUS
  Filled 2022-05-05 (×3): qty 100

## 2022-05-05 NOTE — Progress Notes (Signed)
Patient ID: Leslie Evans, female   DOB: [birth year], 67 y.o.   MRN: [id number]     SURGICAL PROGRESS NOTE   Hospital Day(s): 3.   Interval History: Patient seen and examined, no acute events or new complaints overnight. Patient reports she has been having intermittent nausea.  She was unable to tolerate water but she was doing better with a clear liquid.  She endorses that she farted couple of times during the day today.  Continue having intermittent pain.  Pain controlled with current pain medications.  Vital signs in last 24 hours: [min-max] current  Temp:  [98.1 F (36.7 C)-98.6 F (37 C)] 98.4 F (36.9 C) (04/29 1554) Pulse Rate:  [61-65] 63 (04/29 1554) Resp:  [18-20] 18 (04/29 1554) BP: (153-165)/(83-97) 153/97 (04/29 1554) SpO2:  [93 %-97 %] 94 % (04/29 1554)     Height: 5\' 5"  (165.1 cm) Weight: 51.4 kg BMI (Calculated): 18.87   Physical Exam:  Constitutional: alert, cooperative and no distress  Respiratory: breathing non-labored at rest  Cardiovascular: regular rate and sinus rhythm  Gastrointestinal: soft, mild-tender, and non-distended  Labs:     Latest Ref Rng & Units 05/04/2022    5:52 AM 05/03/2022    5:45 AM 05/02/2022    9:55 AM  CBC  WBC 4.0 - 10.5 K/uL 6.3  12.5  6.5   Hemoglobin 12.0 - 15.0 g/dL 72.5  36.6  44.0   Hematocrit 36.0 - 46.0 % 34.5  39.1  44.3   Platelets 150 - 400 K/uL 165  187  243       Latest Ref Rng & Units 05/04/2022    5:52 AM 05/03/2022    5:45 AM 05/02/2022    9:55 AM  CMP  Glucose 70 - 99 mg/dL 88  347  425   BUN 8 - 23 mg/dL 8  11  11    Creatinine 0.44 - 1.00 mg/dL 9.56  3.87  5.64   Sodium 135 - 145 mmol/L 139  137  141   Potassium 3.5 - 5.1 mmol/L 3.0  3.5  3.2   Chloride 98 - 111 mmol/L 111  109  109   CO2 22 - 32 mmol/L 24  23  24    Calcium 8.9 - 10.3 mg/dL 8.9  9.1  9.8   Total Protein 6.5 - 8.1 g/dL   6.6   Total Bilirubin 0.3 - 1.2 mg/dL   0.7   Alkaline Phos 38 - 126 U/L   61   AST 15 - 41 U/L   17   ALT 0 - 44 U/L   13      Imaging studies: No new pertinent imaging studies   Assessment/Plan:  67 y.o. female with large intestinal closed-loop obstruction 3 Day Post-Op s/p lysis of adhesion, complicated by pertinent comorbidities including obesity, chronic pancreatic insufficiency, hypertension.   Continue with intermittent nausea but at least she started passing gas later today.  Hopefully this is the start of better GI function.  Will continue with current liquid diet for tonight.  Encouraged patient to ambulate.  Continue pain management.  Gae Gallop, MD

## 2022-05-05 NOTE — Progress Notes (Signed)
  Transition of Care (TOC) Screening Note   Patient Details  Name: Leslie Evans Date of Birth: [birth year]   [facility] CM/SW Contact:    Chapman Fitch, RN Phone Number: 05/05/2022, 3:25 PM    Transition of Care Department [facility]) has reviewed patient and no TOC needs have been identified at this time. We will continue to monitor patient advancement through interdisciplinary progression rounds. If new patient transition needs arise, please place a TOC consult.

## 2022-05-05 NOTE — Care Management Important Message (Signed)
Important Message  Patient Details  Name: Leslie Evans MRN: [id number] Date of Birth: Oct 15, 1955   Medicare Important Message Given:  Yes     Johnell Comings 05/05/2022, 11:47 AM

## 2022-05-06 LAB — BASIC METABOLIC PANEL
Anion gap: 9 (ref 5–15)
BUN: 6 mg/dL — ABNORMAL LOW (ref 8–23)
CO2: 24 mmol/L (ref 22–32)
Calcium: 9 mg/dL (ref 8.9–10.3)
Chloride: 108 mmol/L (ref 98–111)
Creatinine, Ser: 0.53 mg/dL (ref 0.44–1.00)
GFR, Estimated: >60 mL/min (ref >60)
Glucose, Bld: 102 mg/dL — ABNORMAL HIGH (ref 70–99)
Potassium: 3 mmol/L — ABNORMAL LOW (ref 3.5–5.1)
Sodium: 141 mmol/L (ref 135–145)

## 2022-05-06 MED ORDER — ACETAMINOPHEN 325 MG PO TABS
650.0000 mg | ORAL_TABLET | Freq: Four times a day (QID) | ORAL | Status: DC | PRN
  Administered 2022-05-06 – 2022-05-09 (×7): 650 mg via ORAL
  Filled 2022-05-06 (×7): qty 2

## 2022-05-06 MED ORDER — POTASSIUM CHLORIDE CRYS ER 20 MEQ PO TBCR
40.0000 meq | EXTENDED_RELEASE_TABLET | Freq: Two times a day (BID) | ORAL | Status: AC
  Administered 2022-05-06 (×2): 40 meq via ORAL
  Filled 2022-05-06 (×2): qty 2

## 2022-05-06 NOTE — Progress Notes (Signed)
Patient ID: Leslie Evans, female   DOB: [birth year], 67 y.o.   MRN: [id number]     SURGICAL PROGRESS NOTE   Hospital Day(s): 4.   Interval History: Patient seen and examined, no acute events or new complaints overnight. Patient reports slowly feeling better.  She endorses that she continues to pass gas.  She also had a small bowel movement today.  Pain is slowly getting under better control.  Today she was able to tolerate full liquids.  Still not ready to try solid food yet.  Vital signs in last 24 hours: [min-max] current  Temp:  [97.7 F (36.5 C)-98.7 F (37.1 C)] 97.7 F (36.5 C) (04/30 1522) Pulse Rate:  [55-62] 59 (04/30 1522) Resp:  [16-18] 18 (04/30 1522) BP: (144-184)/(80-93) 144/93 (04/30 1522) SpO2:  [95 %-98 %] 97 % (04/30 1522)     Height: 5\' 5"  (165.1 cm) Weight: 51.4 kg BMI (Calculated): 18.87   Physical Exam:  Constitutional: alert, cooperative and no distress  Respiratory: breathing non-labored at rest  Cardiovascular: regular rate and sinus rhythm  Gastrointestinal: soft, non-tender, and mild-distended  Labs:     Latest Ref Rng & Units 05/04/2022    5:52 AM 05/03/2022    5:45 AM 05/02/2022    9:55 AM  CBC  WBC 4.0 - 10.5 K/uL 6.3  12.5  6.5   Hemoglobin 12.0 - 15.0 g/dL 62.9  52.8  41.3   Hematocrit 36.0 - 46.0 % 34.5  39.1  44.3   Platelets 150 - 400 K/uL 165  187  243       Latest Ref Rng & Units 05/06/2022    4:50 AM 05/04/2022    5:52 AM 05/03/2022    5:45 AM  CMP  Glucose 70 - 99 mg/dL 244  88  010   BUN 8 - 23 mg/dL 6  8  11    Creatinine 0.44 - 1.00 mg/dL 2.72  5.36  6.44   Sodium 135 - 145 mmol/L 141  139  137   Potassium 3.5 - 5.1 mmol/L 3.0  3.0  3.5   Chloride 98 - 111 mmol/L 108  111  109   CO2 22 - 32 mmol/L 24  24  23    Calcium 8.9 - 10.3 mg/dL 9.0  8.9  9.1     Imaging studies: No new pertinent imaging studies   Assessment/Plan:  67 y.o. female with large intestinal closed-loop obstruction 4 Day Post-Op s/p lysis of adhesion, complicated  by pertinent comorbidities including obesity, chronic pancreatic insufficiency, hypertension.   -Continue slowly progress.  Today she was able to pass gas and had a small bowel movement.  Still not ready to advance to soft diet due to mild nausea.  She is at least keeping down the full liquids. -Continue pain management -Encourage patient to ambulate.  Gae Gallop, MD

## 2022-05-07 LAB — CBC
HCT: 36.7 % (ref 36.0–46.0)
Hemoglobin: 12.7 g/dL (ref 12.0–15.0)
MCH: 31.1 pg (ref 26.0–34.0)
MCHC: 34.6 g/dL (ref 30.0–36.0)
MCV: 89.7 fL (ref 80.0–100.0)
Platelets: 217 10*3/uL (ref 150–400)
RBC: 4.09 MIL/uL (ref 3.87–5.11)
RDW: 13 % (ref 11.5–15.5)
WBC: 5.1 10*3/uL (ref 4.0–10.5)
nRBC: 0 % (ref 0.0–0.2)

## 2022-05-07 LAB — POTASSIUM: Potassium: 4.1 mmol/L (ref 3.5–5.1)

## 2022-05-07 MED ORDER — PANTOPRAZOLE SODIUM 40 MG PO TBEC
40.0000 mg | DELAYED_RELEASE_TABLET | Freq: Every day | ORAL | Status: DC
  Administered 2022-05-07 – 2022-05-08 (×2): 40 mg via ORAL
  Filled 2022-05-07 (×2): qty 1

## 2022-05-07 NOTE — TOC CM/SW Note (Signed)
Patient requesting assistance with applying for social security and Medicaid. Sent secure email to Artist.  Charlynn Court, CSW [phone]

## 2022-05-07 NOTE — Progress Notes (Signed)
Patient ID: Teodora Medici Bottger, female   DOB: [birth year], 67 y.o.   MRN: [id number]     SURGICAL PROGRESS NOTE   Hospital Day(s): 5.   Interval History: Patient seen and examined, no acute events or new complaints overnight. Patient reports she has been slowly feeling better.  She has been able to tolerate full liquids.  Continue passing gas and had multiple bowel movement.  No nausea.  Vital signs in last 24 hours: [min-max] current  Temp:  [97.6 F (36.4 C)-98.4 F (36.9 C)] 98.4 F (36.9 C) (05/01 1623) Pulse Rate:  [52-65] 65 (05/01 1623) Resp:  [15-20] 20 (05/01 1623) BP: (135-179)/(78-104) 179/104 (05/01 1623) SpO2:  [96 %-99 %] 99 % (05/01 1623)     Height: 5\' 5"  (165.1 cm) Weight: 51.4 kg BMI (Calculated): 18.87   Physical Exam:  Constitutional: alert, cooperative and no distress  Respiratory: breathing non-labored at rest  Cardiovascular: regular rate and sinus rhythm  Gastrointestinal: soft, non-tender, and non-distended.  The wounds were and clean  Labs:     Latest Ref Rng & Units 05/07/2022    6:23 AM 05/04/2022    5:52 AM 05/03/2022    5:45 AM  CBC  WBC 4.0 - 10.5 K/uL 5.1  6.3  12.5   Hemoglobin 12.0 - 15.0 g/dL 14.7  82.9  56.2   Hematocrit 36.0 - 46.0 % 36.7  34.5  39.1   Platelets 150 - 400 K/uL 217  165  187       Latest Ref Rng & Units 05/07/2022    6:23 AM 05/06/2022    4:50 AM 05/04/2022    5:52 AM  CMP  Glucose 70 - 99 mg/dL  130  88   BUN 8 - 23 mg/dL  6  8   Creatinine 8.65 - 1.00 mg/dL  7.84  6.96   Sodium 295 - 145 mmol/L  141  139   Potassium 3.5 - 5.1 mmol/L 4.1  3.0  3.0   Chloride 98 - 111 mmol/L  108  111   CO2 22 - 32 mmol/L  24  24   Calcium 8.9 - 10.3 mg/dL  9.0  8.9     Imaging studies: No new pertinent imaging studies   Assessment/Plan:  67 y.o. female with large intestinal closed-loop obstruction 5 Day Post-Op s/p lysis of adhesion, complicated by pertinent comorbidities including obesity, chronic pancreatic insufficiency, hypertension.    -Patient continue adequate slowly progress.   -She was able to tolerate full liquids.  Having bowel movements and passing gas.  Will advance to soft diet -Will consult dietitian for recommendation of better nutritional supplements -Continue with pain control -Encouraged to ambulate  Gae Gallop, MD

## 2022-05-08 ENCOUNTER — Inpatient Hospital Stay: Payer: Medicare Other

## 2022-05-08 MED ORDER — IOHEXOL 9 MG/ML PO SOLN
500.0000 mL | ORAL | Status: AC
  Administered 2022-05-08 (×2): 500 mL via ORAL

## 2022-05-08 MED ORDER — ADULT MULTIVITAMIN W/MINERALS CH
1.0000 | ORAL_TABLET | Freq: Every day | ORAL | Status: DC
  Filled 2022-05-08: qty 1

## 2022-05-08 MED ORDER — IOHEXOL 300 MG/ML  SOLN
80.0000 mL | Freq: Once | INTRAMUSCULAR | Status: AC | PRN
  Administered 2022-05-08: 80 mL via INTRAVENOUS

## 2022-05-08 MED ORDER — KATE FARMS STANDARD 1.4 PO LIQD
325.0000 mL | Freq: Two times a day (BID) | ORAL | Status: DC
  Filled 2022-05-08: qty 325

## 2022-05-08 NOTE — Care Management Important Message (Signed)
Important Message  Patient Details  Name: Leslie Evans MRN: [id number] Date of Birth: 1955-03-13   Medicare Important Message Given:  Yes     Johnell Comings 05/08/2022, 1:42 PM

## 2022-05-08 NOTE — Progress Notes (Addendum)
Initial Nutrition Assessment  DOCUMENTATION CODES:   Severe malnutrition in context of chronic illness  INTERVENTION:   Molli Posey supplement chocolate BID- each supplement provides 330kcal and 16g protein   MVI po daily   Pt at high refeed risk; recommend monitor potassium, magnesium and phosphorus labs daily until stable  Daily weights   Would recommend checking zinc, copper, vitamin E, vitamin A, vitamin D, B1, B12, B3, selenium and folic acid as an outpatient as these vitamins are often depleted with chronic diarrhea and pt is at high risk for malabsorption.  NUTRITION DIAGNOSIS:   Severe Malnutrition related to chronic illness as evidenced by severe fat depletion, severe muscle depletion.  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 pylori, hiatal hernia and chronic pain who is admitted with closed-loop obstruction of the transverse colon now s/p exploratory laparotomy with extensive lysis of adhesions 4/26.  Visited pt's room today. RD unable to speak with patient today after four attempts. Pt upset and on phone dealing with "family drama". Per chart review, pt with a long standing history of chronic diarrhea secondary to colitis. Pt is followed by GI and was also diagnosed with EPI. Pt is on zenpep and budesonide at home. Pt has also received bentyl in the past; RD unsure if pt is still on this. Pt does have vitamin labs checked by GI. Pt noted to have vitamin E deficiency and supposedly takes this OTC at home. Would recommend checking zinc, copper, vitamin E, vitamin A, vitamin D, B1, B12, B3, selenium and folic acid as an outpatient as these  vitamins are often depleted with chronic diarrhea and pt is at high risk for malabsorption. Pt noted to have poor dentition. RD suspects pt with decreased oral intake at baseline. Pt is noted to be eating 20-100% of meals in hospital; pt ate 50% of her breakfast this morning. Per chart review, pt does not like Boost or Ensure. RD will try Molli Posey (a plant based supplement) to see if patient will like this better. Pt is at high refeed risk. RD will attempt to obtain nutrition related history at follow up.   Medications reviewed and include: lovenox, creon, protonix  Labs reviewed: K 4.1 wnl- 5/1  NUTRITION - FOCUSED PHYSICAL EXAM:  Flowsheet Row Most Recent Value  Orbital Region Moderate depletion  Upper Arm Region Severe depletion  Thoracic and Lumbar Region Severe depletion  Buccal Region Moderate depletion  Temple Region Moderate depletion  Clavicle Bone Region Severe depletion  Clavicle and Acromion Bone Region Severe depletion  Scapular Bone Region Severe depletion  Dorsal Hand Severe depletion  Patellar Region Unable to assess  Anterior Thigh Region Unable to assess  Posterior Calf Region Unable to assess  Edema (RD Assessment) None  Hair Reviewed  Eyes Reviewed  Mouth Reviewed  Skin Reviewed  Nails Reviewed   Diet Order:   Diet Order             DIET SOFT Room service appropriate? Yes; Fluid consistency: Thin  Diet effective now                  EDUCATION NEEDS:   Education needs have been  addressed  Skin:  Skin Assessment: Reviewed RN Assessment (incision abdomen)  Last BM:  5/2- type 7  Height:   Ht Readings from Last 1 Encounters:  05/02/22 5\' 5"  (1.651 m)    Weight:   Wt Readings from Last 1 Encounters:  05/02/22 51.4 kg    Ideal Body Weight:  56.8 kg  BMI:  Body mass index is 18.87 kg/m.  Estimated Nutritional Needs:   Kcal:  1500-1700kcal/day  Protein:  75-85g/day  Fluid:  1.3-1.5L/day  Betsey Holiday MS, RD, LDN Please refer to  [facility] for RD and/or RD on-call/weekend/after hours pager

## 2022-05-08 NOTE — Progress Notes (Signed)
Patient ID: Leslie Evans, female   DOB: 98/01/1912, 67 y.o.   MRN: [id number]     SURGICAL PROGRESS NOTE   Hospital Day(s): 6.   Interval History: Patient seen and examined, no acute events or new complaints overnight. Patient reports having more frequent intermittent cramping and severe pain compared to yesterday.  Today she has been having difficulty passing gas or bowel movement.  No specific nausea or vomiting.  Vital signs in last 24 hours: [min-max] current  Temp:  [98.1 F (36.7 C)-98.7 F (37.1 C)] 98.7 F (37.1 C) (05/02 0755) Pulse Rate:  [54-97] 88 (05/02 1157) Resp:  [17-20] 18 (05/02 0755) BP: (144-189)/(79-104) 154/88 (05/02 1157) SpO2:  [95 %-99 %] 97 % (05/02 0755)     Height: 5\' 5"  (165.1 cm) Weight: 51.4 kg BMI (Calculated): 18.87   Physical Exam:  Constitutional: alert, cooperative and no distress  Respiratory: breathing non-labored at rest  Cardiovascular: regular rate and sinus rhythm  Gastrointestinal: soft, tender in the lower abdomen, and non-distended  Labs:     Latest Ref Rng & Units 05/07/2022    6:23 AM 05/04/2022    5:52 AM 05/03/2022    5:45 AM  CBC  WBC 4.0 - 10.5 K/uL 5.1  6.3  12.5   Hemoglobin 12.0 - 15.0 g/dL 08.6  57.8  46.9   Hematocrit 36.0 - 46.0 % 36.7  34.5  39.1   Platelets 150 - 400 K/uL 217  165  187       Latest Ref Rng & Units 05/07/2022    6:23 AM 05/06/2022    4:50 AM 05/04/2022    5:52 AM  CMP  Glucose 70 - 99 mg/dL  629  88   BUN 8 - 23 mg/dL  6  8   Creatinine 5.28 - 1.00 mg/dL  4.13  2.44   Sodium 010 - 145 mmol/L  141  139   Potassium 3.5 - 5.1 mmol/L 4.1  3.0  3.0   Chloride 98 - 111 mmol/L  108  111   CO2 22 - 32 mmol/L  24  24   Calcium 8.9 - 10.3 mg/dL  9.0  8.9     Imaging studies: No new pertinent imaging studies   Assessment/Plan:  67 y.o. female with large intestinal closed-loop obstruction 5 Day Post-Op s/p lysis of adhesion, complicated by pertinent comorbidities including obesity, chronic pancreatic  insufficiency, hypertension.    -Now significantly more comfortable today compared to yesterday.  Concern of intra-abdominal complication.  Will order CT scan of the abdomen and pelvis. -Continue pain management  Gae Gallop, MD

## 2022-05-09 DIAGNOSIS — E43 Unspecified severe protein-calorie malnutrition: Secondary | ICD-10-CM | POA: Insufficient documentation

## 2022-05-09 MED ORDER — OXYCODONE HCL 5 MG PO TABS: 5.0000 mg | ORAL_TABLET | ORAL | 0 refills | Status: DC | PRN

## 2022-05-09 MED ORDER — METHOCARBAMOL 500 MG PO TABS: 500.0000 mg | ORAL_TABLET | Freq: Three times a day (TID) | ORAL | 0 refills | Status: AC | PRN

## 2022-05-09 NOTE — Progress Notes (Signed)
Discharge education and materials provided to patient, all questions addressed at this time. Iv removed with no complications. Patient escorted off unit by member of unit staff to family member's car

## 2022-05-09 NOTE — Plan of Care (Signed)
  Problem: Health Behavior/Discharge Planning: Goal: Ability to manage health-related needs will improve Outcome: Progressing   Problem: Clinical Measurements: Goal: Will remain free from infection Outcome: Progressing   Problem: Clinical Measurements: Goal: Respiratory complications will improve Outcome: Progressing   Problem: Clinical Measurements: Goal: Cardiovascular complication will be avoided Outcome: Progressing   Problem: Elimination: Goal: Will not experience complications related to bowel motility Outcome: Progressing   Problem: Pain Managment: Goal: General experience of comfort will improve Outcome: Progressing   Problem: Safety: Goal: Ability to remain free from injury will improve Outcome: Progressing   

## 2022-05-09 NOTE — Discharge Instructions (Signed)

## 2022-05-09 NOTE — Discharge Summary (Signed)
Patient ID: Leslie Evans MRN: [id number] DOB/AGE: Jun 04, 1955 67 y.o.  Admit date: 05/02/2022 Discharge date: 05/09/2022   Discharge Diagnoses:  Principal Problem:   Internal hernia Active Problems:   Protein-calorie malnutrition, severe   Procedures: [facility] Course: Patient admitted with chronic, intermittent large bowel obstruction.  Enterolysis was done and obstruction was resolved.  She has been recovering slowly but adequately.  Patient is ambulating.  Pain controlled.  Patient is tolerating soft diet.  Patient passing gas and having bowel movement.  The wound is healing adequately.  Physical Exam Vitals reviewed.  HENT:     Head: Normocephalic.  Cardiovascular:     Rate and Rhythm: Normal rate and regular rhythm.     Heart sounds: Normal heart sounds.  Pulmonary:     Effort: Pulmonary effort is normal.     Breath sounds: Normal breath sounds.  Abdominal:     General: Abdomen is flat. Bowel sounds are normal. There is no distension.     Palpations: Abdomen is soft.     Tenderness: There is no abdominal tenderness.  Skin:    Capillary Refill: Capillary refill takes less than 2 seconds.  Neurological:     Mental Status: She is alert and oriented to person, place, and time.      Consults: None  Disposition: Discharge disposition: 01-Home or Self Care       Discharge Instructions     Diet - low sodium heart healthy   Complete by: As directed    Increase activity slowly   Complete by: As directed       Allergies as of 05/09/2022       Reactions   Lamisil [terbinafine] Anaphylaxis   Pregabalin Other (See Comments)   Tetracycline Other (See Comments)   Gabapentin Dermatitis   Ibuprofen Nausea And Vomiting        Medication List     TAKE these medications    acetaminophen 500 MG tablet Commonly known as: TYLENOL Take 500 mg by mouth every 6 (six) hours as needed.   albuterol 108 (90 Base) MCG/ACT inhaler Commonly known as:  VENTOLIN HFA INHALE 2 PUFFS BY MOUTH EVERY 4 HOURS AS NEEDED FOR WHEEZING FOR SHORTNESS OF BREATH   budesonide 3 MG 24 hr capsule Commonly known as: ENTOCORT EC Take 3 capsules (9 mg total) by mouth once daily.   budesonide-formoterol 160-4.5 MCG/ACT inhaler Commonly known as: Symbicort Inhale 2 puffs into the lungs 2 (two) times daily.   cholecalciferol 25 MCG (1000 UNIT) tablet Commonly known as: VITAMIN D3 Take 1,000 Units by mouth daily.   citalopram 40 MG tablet Commonly known as: CELEXA Take 1 tablet by mouth once daily   dicyclomine 20 MG tablet Commonly known as: BENTYL Take 1 tablet (20 mg total) by mouth 3 (three) times daily as needed for spasms (abd cramping).   hydrOXYzine 25 MG capsule Commonly known as: VISTARIL TAKE 1 TO 2 CAPSULES BY MOUTH TWICE DAILY AS NEEDED FOR INSOMNIA FOR ANXIETY   ibuprofen 200 MG tablet Commonly known as: ADVIL Take 200 mg by mouth every 6 (six) hours as needed for mild pain.   lisinopril 40 MG tablet Commonly known as: ZESTRIL Take 1 tablet by mouth once daily   methocarbamol 500 MG tablet Commonly known as: ROBAXIN Take 1 tablet (500 mg total) by mouth every 8 (eight) hours as needed for up to 10 days for muscle spasms.   metoCLOPramide 10 MG tablet Commonly known as: REGLAN Take 1 tablet (10  mg total) by mouth every 6 (six) hours as needed for nausea or vomiting.   ondansetron 4 MG disintegrating tablet Commonly known as: Zofran ODT Dissolve 1-2 tablets (4-8 mg total) by mouth once every 8 (eight) hours as needed for nausea or vomiting.   oxyCODONE 5 MG immediate release tablet Commonly known as: Oxy IR/ROXICODONE Take 1 tablet (5 mg total) by mouth every 4 (four) hours as needed for moderate pain or severe pain.   pantoprazole 40 MG tablet Commonly known as: PROTONIX Take 1 tablet (40 mg total) by mouth daily.   rosuvastatin 5 MG tablet Commonly known as: CRESTOR Take 1 tablet by mouth once daily   sucralfate 1 g  tablet Commonly known as: CARAFATE Take 1 tablet (1 g total) by mouth 3 (three) times daily with meals as needed for GERD/reflux/epigastric pain when eating or drinking.   tiZANidine 4 MG tablet Commonly known as: Zanaflex Take 0.5-1 tablets (2-4 mg total) by mouth every 8 (eight) hours as needed for muscle spasms (muscle tightness).   Zenpep 40000-126000 units Cpep Generic drug: Pancrelipase (Lip-Prot-Amyl) Take 2 capsules with the first bite of each meal and 1 capsule with the first bite of each snack        Follow-up Information     Carolan Shiver, MD Follow up in 1 week(s).   Specialty: General Surgery Why: follow up after surgery for obstruction of large intestine Contact information: [address] [place] [address] [phone]

## 2022-05-12 ENCOUNTER — Telehealth: Payer: Self-pay

## 2022-05-12 NOTE — Transitions of Care (Post Inpatient/ED Visit) (Signed)
   05/12/2022  Name: Leslie Evans MRN: [id number] DOB: [birth year]  Today's TOC FU Call Status: Today's TOC FU Call Status:: Unsuccessul Call (1st Attempt) Unsuccessful Call (1st Attempt) Date: 05/12/22  Attempted to reach the patient regarding the most recent Inpatient/ED visit.  Follow Up Plan: Additional outreach attempts will be made to reach the patient to complete the Transitions of Care (Post Inpatient/ED visit) call.   Signature Karena Addison, LPN [facility] Nurse Health Advisor Direct Dial [phone]

## 2022-05-12 NOTE — Transitions of Care (Post Inpatient/ED Visit) (Signed)
05/12/2022  Name: Leslie Evans MRN: [id number] DOB: [birth year]  Today's TOC FU Call Status: Today's TOC FU Call Status:: Successful TOC FU Call Competed Unsuccessful Call (1st Attempt) Date: 05/12/22 [facility] FU Call Complete Date: 05/12/22  Transition Care Management Follow-up Telephone Call Date of Discharge: 05/09/22 Discharge Facility: [facility]) Type of Discharge: Inpatient Admission Primary Inpatient Discharge Diagnosis:: henia How have you been since you were released from the hospital?: Better Any questions or concerns?: No  Items Reviewed: Did you receive and understand the discharge instructions provided?: Yes Medications obtained,verified, and reconciled?: Yes (Medications Reviewed) Any new allergies since your discharge?: No Dietary orders reviewed?: Yes Do you have support at home?: Yes People in Home: child(ren), adult  Medications Reviewed Today: Medications Reviewed Today     Reviewed by Karena Addison, LPN (Licensed Practical Nurse) on 05/12/22 at 1115  Med List Status: <None>   Medication Order Taking? Sig Documenting Provider Last Dose Status Informant  acetaminophen (TYLENOL) 500 MG tablet [id number] Yes Take 500 mg by mouth every 6 (six) hours as needed. [provider] Taking Active Self  albuterol (VENTOLIN HFA) 108 (90 Base) MCG/ACT inhaler [id number] Yes INHALE 2 PUFFS BY MOUTH EVERY 4 HOURS AS NEEDED FOR WHEEZING FOR SHORTNESS OF BREATH Berniece Salines, FNP Taking Active Self  budesonide (ENTOCORT EC) 3 MG 24 hr capsule [id number] Yes Take 3 capsules (9 mg total) by mouth once daily. Toney Reil, MD Taking Active Self  budesonide-formoterol [facility]) 160-4.5 MCG/ACT inhaler [id number] Yes Inhale 2 puffs into the lungs 2 (two) times daily. Berniece Salines, FNP Taking Active Self  cholecalciferol (VITAMIN D3) 25 MCG (1000 UNIT) tablet [id number] Yes Take 1,000 Units by mouth daily. [provider] Taking  Active Self  citalopram (CELEXA) 40 MG tablet [id number] Yes Take 1 tablet by mouth once daily Berniece Salines, FNP Taking Active Self  dicyclomine (BENTYL) 20 MG tablet [id number] Yes Take 1 tablet (20 mg total) by mouth 3 (three) times daily as needed for spasms (abd cramping). Berniece Salines, FNP Taking Active Self  hydrOXYzine (VISTARIL) 25 MG capsule [id number] Yes TAKE 1 TO 2 CAPSULES BY MOUTH TWICE DAILY AS NEEDED FOR INSOMNIA FOR ANXIETY Danelle Berry, PA-C Taking Active Self  ibuprofen (ADVIL) 200 MG tablet [id number] Yes Take 200 mg by mouth every 6 (six) hours as needed for mild pain. [provider] Taking Active Self  lisinopril (ZESTRIL) 40 MG tablet [id number] Yes Take 1 tablet by mouth once daily Berniece Salines, FNP Taking Active Self  methocarbamol (ROBAXIN) 500 MG tablet [id number] Yes Take 1 tablet (500 mg total) by mouth every 8 (eight) hours as needed for up to 10 days for muscle spasms. Carolan Shiver, MD Taking Active   metoCLOPramide (REGLAN) 10 MG tablet [id number] Yes Take 1 tablet (10 mg total) by mouth every 6 (six) hours as needed for nausea or vomiting. Berniece Salines, FNP Taking Active Self  ondansetron (ZOFRAN ODT) 4 MG disintegrating tablet [id number] Yes Dissolve 1-2 tablets (4-8 mg total) by mouth once every 8 (eight) hours as needed for nausea or vomiting. Linwood Dibbles, MD Taking Active Self  oxyCODONE (OXY IR/ROXICODONE) 5 MG immediate release tablet [id number] Yes Take 1 tablet (5 mg total) by mouth every 4 (four) hours as needed for moderate pain or severe pain. Carolan Shiver, MD Taking Active   Pancrelipase, Lip-Prot-Amyl, (ZENPEP) 40000-126000 units CPEP [id number] Yes Take 2 capsules with the first bite of each meal and 1 capsule  with the first bite of each snack Vanga, Loel Dubonnet, MD Taking Active Self  pantoprazole (PROTONIX) 40 MG tablet [id number] Yes Take 1 tablet (40 mg total) by mouth daily. Linwood Dibbles, MD Taking Active Self  rosuvastatin  (CRESTOR) 5 MG tablet [id number] Yes Take 1 tablet by mouth once daily Berniece Salines, FNP Taking Active Self  sucralfate (CARAFATE) 1 g tablet [id number] Yes Take 1 tablet (1 g total) by mouth 3 (three) times daily with meals as needed for GERD/reflux/epigastric pain when eating or drinking. Toney Reil, MD Taking Active Self  tiZANidine (ZANAFLEX) 4 MG tablet [id number] Yes Take 0.5-1 tablets (2-4 mg total) by mouth every 8 (eight) hours as needed for muscle spasms (muscle tightness). Berniece Salines, FNP Taking Active Self            Home Care and Equipment/Supplies: Were Home Health Services Ordered?: NA Any new equipment or medical supplies ordered?: NA  Functional Questionnaire: Do you need assistance with bathing/showering or dressing?: Yes Do you need assistance with meal preparation?: No Do you need assistance with eating?: No Do you have difficulty maintaining continence: No Do you need assistance with getting out of bed/getting out of a chair/moving?: Yes Do you have difficulty managing or taking your medications?: No  Follow up appointments reviewed: PCP Follow-up appointment confirmed?: Yes Date of PCP follow-up appointment?: 05/16/22 Follow-up Provider: Della Goo Specialist [facility] Follow-up appointment confirmed?: Yes Date of Specialist follow-up appointment?: 05/15/22 Follow-Up Specialty Provider:: Dr Hazle Quant Do you need transportation to your follow-up appointment?: No Do you understand care options if your condition(s) worsen?: Yes-patient verbalized understanding    SIGNATURE Karena Addison, LPN [facility] Nurse Health Advisor Direct Dial [phone]

## 2022-05-16 ENCOUNTER — Encounter: Payer: Self-pay | Admitting: Nurse Practitioner

## 2022-05-16 ENCOUNTER — Other Ambulatory Visit: Payer: Self-pay

## 2022-05-16 ENCOUNTER — Ambulatory Visit (INDEPENDENT_AMBULATORY_CARE_PROVIDER_SITE_OTHER): Payer: Medicare Other | Admitting: Nurse Practitioner

## 2022-05-16 VITALS — BP 112/82 | HR 95 | Temp 97.7°F | Resp 16 | Ht 65.0 in | Wt 108.8 lb

## 2022-05-16 DIAGNOSIS — F419 Anxiety disorder, unspecified: Secondary | ICD-10-CM

## 2022-05-16 DIAGNOSIS — R634 Abnormal weight loss: Secondary | ICD-10-CM

## 2022-05-16 DIAGNOSIS — E43 Unspecified severe protein-calorie malnutrition: Secondary | ICD-10-CM

## 2022-05-16 DIAGNOSIS — J449 Chronic obstructive pulmonary disease, unspecified: Secondary | ICD-10-CM | POA: Diagnosis not present

## 2022-05-16 DIAGNOSIS — Z131 Encounter for screening for diabetes mellitus: Secondary | ICD-10-CM

## 2022-05-16 DIAGNOSIS — I10 Essential (primary) hypertension: Secondary | ICD-10-CM | POA: Diagnosis not present

## 2022-05-16 DIAGNOSIS — K458 Other specified abdominal hernia without obstruction or gangrene: Secondary | ICD-10-CM

## 2022-05-16 DIAGNOSIS — K52831 Collagenous colitis: Secondary | ICD-10-CM

## 2022-05-16 DIAGNOSIS — Z5941 Food insecurity: Secondary | ICD-10-CM | POA: Diagnosis not present

## 2022-05-16 DIAGNOSIS — Z1231 Encounter for screening mammogram for malignant neoplasm of breast: Secondary | ICD-10-CM

## 2022-05-16 DIAGNOSIS — Z09 Encounter for follow-up examination after completed treatment for conditions other than malignant neoplasm: Secondary | ICD-10-CM

## 2022-05-16 DIAGNOSIS — E7800 Pure hypercholesterolemia, unspecified: Secondary | ICD-10-CM | POA: Insufficient documentation

## 2022-05-16 DIAGNOSIS — Z5912 Inadequate housing utilities: Secondary | ICD-10-CM

## 2022-05-16 LAB — CBC WITH DIFFERENTIAL/PLATELET
HCT: 48.4 % — ABNORMAL HIGH (ref 35.0–45.0)
MCV: 92.2 fL (ref 80.0–100.0)
MPV: 11.1 fL (ref 7.5–12.5)
WBC: 10 10*3/uL (ref 3.8–10.8)

## 2022-05-16 NOTE — Assessment & Plan Note (Signed)
She says she has had increased stress due to financial reasons, getting social worker involved.

## 2022-05-16 NOTE — Assessment & Plan Note (Signed)
Continue taking lisinopril 40 mg daily. 

## 2022-05-16 NOTE — Progress Notes (Signed)
BP 112/82   Pulse 95   Temp 97.7 F (36.5 C) (Oral)   Resp 16   Ht 5\' 5"  (1.651 m)   Wt 108 lb 12.8 oz (49.4 kg)   SpO2 96%   BMI 18.11 kg/m    Subjective:    Patient ID: Leslie Evans, female    DOB: [birth year], 67 y.o.   MRN: [id number]  HPI: Leslie Evans is a 67 y.o. female  Chief Complaint  Patient presents with   Follow-up    Hernia surgery   Hypertension   Hyperlipidemia   Hospital follow up/internal hernia   : she was admitted on 05/02/2022 at [facility] and discharged on 05/09/2022.  She went to the emergency room for abdominal pain.  Patient did have exploratory laparotomy that was converted to open laparotomy.    H&P from hospital : 67 y.o. female presented to [facility] ED for evaluation of abdominal pain. Patient reports she has been having worsening abdominal pain since Saturday.  She endorses that the pain was severe that she was bending over.  Pain localized to the suprapubic area.  Pain sometimes radiates to the upper abdomen.  She cannot identify any alleviating or aggravating factors.  The only thing that has control elevated with the pain is the pain medication at the ED today.  She endorses some nausea and vomiting as well.  She has been having small bowel movements.   At the ED she was found with tenderness to palpation in the lower abdomen.  CBC shows no leukocytosis.  Hemoglobin 15.9.  There is no significant electrolyte disturbance.  Adequate renal function.  Lipase within normal limits.  She had a CT scan of the abdomen and pelvis that shows twisting and edema of the sigmoid colon and distal small bowel.  There is concern for internal hernia.  Mesenteric edema concerning for ischemia.   Note patient has history of UC.  She has been followed by GI.  She has been control for more than 10 years.  She endorses being compliant with her medications.  Last colonoscopy 2 years ago.   Surgery is consulted by Dr. Vicente Males in this context for evaluation and management of suspected  internal hernia.  Ct scan showed : 1. Twisting of the sigmoid colon and distal small bowel loops with associated mesenteric edema, findings are concerning for internal hernia with probable neck in the mid upper pelvis. No evidence of upstream obstruction. Mesenteric edema is concerning ischemia, although there is no evidence of pneumatosis or free air. 2. Trace abdominopelvic ascites.   Follow up appointment with surgery was yesterday. 1. Continue on the great care you are doing 2. Continue wound care with soap and water 3. No diet restriction from my standpoint. Follow-up recommendation by general gastroenterology regarding diet for pancreatic insufficiency and UC 4. Continue any activity or lifting restrictions for 4 more weeks 5. I will see you in 4 weeks for wound follow-up but contact us if you have any concern  Carolan Shiver, MD Follow up in 1 week(s).   Specialty: General Surgery Why: follow up after surgery for obstruction of large intestine Contact information: [address] [place] [address] [phone]  Hypertension: she currently takes lisinopril 40 mg daily. Her blood pressure today is 112/82.  She denies any chest pain, shortness of breath, blurred vision or headaches.    Hyperlipidemia: she currently takes rosuvastatin 5 mg daily. She denies any myalgia. Last LDL was 88 on 12/14/2020.   The 10-year  ASCVD risk score (Arnett DK, et al., 2019) is: 10.6%   Values used to calculate the score:     Age: 77 years     Sex: Female     Is Non-Hispanic African American: No     Diabetic: No     Tobacco smoker: Yes     Systolic Blood Pressure: 112 mmHg     Is BP treated: Yes     HDL Cholesterol: 51 mg/dL     Total Cholesterol: 163 mg/dL    COPD: She continues to smoke, she is down to 1/2 pack she says she is going to quit smoking this year.  She uses albuterol as needed and Symbicort.  She reports she is doing well.    Anxiety: she is currently taking  celexa 40 mg daily. She is taking hydroxyzine as needed.  She reports she is doing well.        05/16/2022    2:49 PM 09/04/2021   11:05 AM 12/14/2020    1:50 PM 02/29/2020    9:25 AM 02/17/2020   11:20 AM  Depression screen PHQ 2/9  Decreased Interest 0 1 0 1 0  Down, Depressed, Hopeless 0 1 0 1 1  PHQ - 2 Score 0 2 0 2 1  Altered sleeping 1 0 0 0 3  Tired, decreased energy 0 1 0 2 2  Change in appetite 0 0 0 1 2  Feeling bad or failure about yourself  0 0 0 0 0  Trouble concentrating 0 0 0 1 0  Moving slowly or fidgety/restless 0 0 0 0 0  Suicidal thoughts 0 0 0 0 0  PHQ-9 Score 1 3 0 6 8  Difficult doing work/chores Not difficult at all Not difficult at all Not difficult at all Somewhat difficult Not difficult at all       05/16/2022    2:50 PM 09/04/2021   11:05 AM 12/14/2020    1:50 PM 02/29/2020    9:26 AM  GAD 7 : Generalized Anxiety Score  Nervous, Anxious, on Edge 1 0 0 2  Control/stop worrying 0 0 0 2  Worry too much - different things 1 0 0 2  Trouble relaxing 1 0 0 2  Restless 0 0 0 0  Easily annoyed or irritable 1 0 0 0  Afraid - awful might happen 1 0 0 0  Total GAD 7 Score 5 0 0 8  Anxiety Difficulty Not difficult at all Not difficult at all Not difficult at all Not difficult at all   Colitis/weight loss/malnutrition:  she currently takes budesonide 9 mg daily, zenpep 2 capsules with food. She is followed by Dr. Allegra Lai, GI.  Last seen on 06/27/2021. She was supposed to follow up in 6 months from last appointment but has not. Recommend she reach out to GI office and schedule appointment. She has continued to lose weight. Her weight today is 108 lbs last visit it was 122 on 09/04/2021.  Recommend follow up with GI.    She says she is having trouble getting food, she is having trouble keeping her utilities on.  Will place referral to social worker.    Relevant past medical, surgical, family and social history reviewed and updated as indicated. Interim medical history since  our last visit reviewed. Allergies and medications reviewed and updated.  Review of Systems  Constitutional: Negative for fever , positive for weight change.  Respiratory: Negative for cough and shortness of breath.   Cardiovascular: Negative for  chest pain or palpitations.  Gastrointestinal: Negative for abdominal pain, no bowel changes.  Musculoskeletal: Negative for gait problem or joint swelling.  Skin: Negative for rash.  Neurological: Negative for dizziness or headache.  No other specific complaints in a complete review of systems (except as listed in HPI above).      Objective:    BP 112/82   Pulse 95   Temp 97.7 F (36.5 C) (Oral)   Resp 16   Ht 5\' 5"  (1.651 m)   Wt 108 lb 12.8 oz (49.4 kg)   SpO2 96%   BMI 18.11 kg/m   Wt Readings from Last 3 Encounters:  05/16/22 108 lb 12.8 oz (49.4 kg)  05/02/22 113 lb 6.4 oz (51.4 kg)  09/04/21 122 lb 3.2 oz (55.4 kg)    Physical Exam  Constitutional: Patient appears well-developed and well-nourished. underweight No distress.  HEENT: head atraumatic, normocephalic, pupils equal and reactive to light, neck supple Cardiovascular: Normal rate, regular rhythm and normal heart sounds.  No murmur heard. No BLE edema. Pulmonary/Chest: Effort normal and breath sounds normal. No respiratory distress. Abdominal: Soft.  There is no tenderness. Abdominal binder on Psychiatric: Patient has a normal mood and affect. behavior is normal. Judgment and thought content normal.  Results for orders placed or performed during the hospital encounter of 05/02/22  Lipase, blood  Result Value Ref Range   Lipase 50 11 - 51 U/L  Comprehensive metabolic panel  Result Value Ref Range   Sodium 141 135 - 145 mmol/L   Potassium 3.2 (L) 3.5 - 5.1 mmol/L   Chloride 109 98 - 111 mmol/L   CO2 24 22 - 32 mmol/L   Glucose, Bld 137 (H) 70 - 99 mg/dL   BUN 11 8 - 23 mg/dL   Creatinine, Ser 0.98 0.44 - 1.00 mg/dL   Calcium 9.8 8.9 - 11.9 mg/dL   Total  Protein 6.6 6.5 - 8.1 g/dL   Albumin 3.8 3.5 - 5.0 g/dL   AST 17 15 - 41 U/L   ALT 13 0 - 44 U/L   Alkaline Phosphatase 61 38 - 126 U/L   Total Bilirubin 0.7 0.3 - 1.2 mg/dL   GFR, Estimated >14 >78 mL/min   Anion gap 8 5 - 15  CBC  Result Value Ref Range   WBC 6.5 4.0 - 10.5 K/uL   RBC 5.05 3.87 - 5.11 MIL/uL   Hemoglobin 15.9 (H) 12.0 - 15.0 g/dL   HCT 29.5 62.1 - 30.8 %   MCV 87.7 80.0 - 100.0 fL   MCH 31.5 26.0 - 34.0 pg   MCHC 35.9 30.0 - 36.0 g/dL   RDW 65.7 84.6 - 96.2 %   Platelets 243 150 - 400 K/uL   nRBC 0.0 0.0 - 0.2 %  Urinalysis, Routine w reflex microscopic -Urine, Clean Catch  Result Value Ref Range   Color, Urine STRAW (A) YELLOW   APPearance CLEAR (A) CLEAR   Specific Gravity, Urine 1.004 (L) 1.005 - 1.030   pH 7.0 5.0 - 8.0   Glucose, UA NEGATIVE NEGATIVE mg/dL   Hgb urine dipstick NEGATIVE NEGATIVE   Bilirubin Urine NEGATIVE NEGATIVE   Ketones, ur NEGATIVE NEGATIVE mg/dL   Protein, ur NEGATIVE NEGATIVE mg/dL   Nitrite NEGATIVE NEGATIVE   Leukocytes,Ua NEGATIVE NEGATIVE  CBC  Result Value Ref Range   WBC 12.5 (H) 4.0 - 10.5 K/uL   RBC 4.44 3.87 - 5.11 MIL/uL   Hemoglobin 13.8 12.0 - 15.0 g/dL   HCT 39.1  36.0 - 46.0 %   MCV 88.1 80.0 - 100.0 fL   MCH 31.1 26.0 - 34.0 pg   MCHC 35.3 30.0 - 36.0 g/dL   RDW 16.1 09.6 - 04.5 %   Platelets 187 150 - 400 K/uL   nRBC 0.0 0.0 - 0.2 %  Basic metabolic panel  Result Value Ref Range   Sodium 137 135 - 145 mmol/L   Potassium 3.5 3.5 - 5.1 mmol/L   Chloride 109 98 - 111 mmol/L   CO2 23 22 - 32 mmol/L   Glucose, Bld 100 (H) 70 - 99 mg/dL   BUN 11 8 - 23 mg/dL   Creatinine, Ser 4.09 0.44 - 1.00 mg/dL   Calcium 9.1 8.9 - 81.1 mg/dL   GFR, Estimated >91 >47 mL/min   Anion gap 5 5 - 15  CBC  Result Value Ref Range   WBC 6.3 4.0 - 10.5 K/uL   RBC 3.90 3.87 - 5.11 MIL/uL   Hemoglobin 12.0 12.0 - 15.0 g/dL   HCT 82.9 (L) 56.2 - 13.0 %   MCV 88.5 80.0 - 100.0 fL   MCH 30.8 26.0 - 34.0 pg   MCHC 34.8 30.0 -  36.0 g/dL   RDW 86.5 78.4 - 69.6 %   Platelets 165 150 - 400 K/uL   nRBC 0.0 0.0 - 0.2 %  Basic metabolic panel  Result Value Ref Range   Sodium 139 135 - 145 mmol/L   Potassium 3.0 (L) 3.5 - 5.1 mmol/L   Chloride 111 98 - 111 mmol/L   CO2 24 22 - 32 mmol/L   Glucose, Bld 88 70 - 99 mg/dL   BUN 8 8 - 23 mg/dL   Creatinine, Ser 2.95 0.44 - 1.00 mg/dL   Calcium 8.9 8.9 - 28.4 mg/dL   GFR, Estimated >13 >24 mL/min   Anion gap 4 (L) 5 - 15  Basic metabolic panel  Result Value Ref Range   Sodium 141 135 - 145 mmol/L   Potassium 3.0 (L) 3.5 - 5.1 mmol/L   Chloride 108 98 - 111 mmol/L   CO2 24 22 - 32 mmol/L   Glucose, Bld 102 (H) 70 - 99 mg/dL   BUN 6 (L) 8 - 23 mg/dL   Creatinine, Ser 4.01 0.44 - 1.00 mg/dL   Calcium 9.0 8.9 - 02.7 mg/dL   GFR, Estimated >25 >36 mL/min   Anion gap 9 5 - 15  Potassium  Result Value Ref Range   Potassium 4.1 3.5 - 5.1 mmol/L  CBC  Result Value Ref Range   WBC 5.1 4.0 - 10.5 K/uL   RBC 4.09 3.87 - 5.11 MIL/uL   Hemoglobin 12.7 12.0 - 15.0 g/dL   HCT 64.4 03.4 - 74.2 %   MCV 89.7 80.0 - 100.0 fL   MCH 31.1 26.0 - 34.0 pg   MCHC 34.6 30.0 - 36.0 g/dL   RDW 59.5 63.8 - 75.6 %   Platelets 217 150 - 400 K/uL   nRBC 0.0 0.0 - 0.2 %      Assessment & Plan:   Problem List Items Addressed This Visit       Cardiovascular and Mediastinum   Essential hypertension    Continue taking lisinopril 40 mg daily.       Relevant Orders   COMPLETE METABOLIC PANEL WITH GFR   CBC with Differential/Platelet     Respiratory   Chronic obstructive pulmonary disease (HCC)    Patient reports she is working on quitting smoking  she is down to 1/2 pack a day.  She says her breathing is good.         Digestive   Collagenous colitis    Needs to call and schedule appointment with gi        Other   Chronic anxiety    She says she has had increased stress due to financial reasons, getting social worker involved.       Loss of weight    Increase diet as  tolerated, follow up with gi      Internal hernia - Primary    Had follow up with surgeon yesterday, patient reports she is doing well      Protein-calorie malnutrition, severe    Increase diet as tolerated, follow up with gi      Pure hypercholesterolemia    Getting labs today, continue rosuvastatin 5 mg daily      Relevant Orders   Lipid panel   COMPLETE METABOLIC PANEL WITH GFR   Other Visit Diagnoses     Hospital discharge follow-up       doing well, has had f/u appointment with surgeon   Screening for diabetes mellitus       Relevant Orders   COMPLETE METABOLIC PANEL WITH GFR   Hemoglobin A1c   Breast cancer screening by mammogram       Relevant Orders   MM 3D SCREENING MAMMOGRAM BILATERAL BREAST   Inadequate housing utilities       Relevant Orders   AMB Referral to Chronic Care Management Services   Food insecurity       Relevant Orders   AMB Referral to Chronic Care Management Services        Follow up plan: Return in about 6 months (around 11/16/2022) for follow up.

## 2022-05-16 NOTE — Assessment & Plan Note (Signed)
Increase diet as tolerated, follow up with gi

## 2022-05-16 NOTE — Assessment & Plan Note (Signed)
Needs to call and schedule appointment with gi

## 2022-05-16 NOTE — Assessment & Plan Note (Signed)
Patient reports she is working on quitting smoking she is down to 1/2 pack a day.  She says her breathing is good.

## 2022-05-16 NOTE — Assessment & Plan Note (Signed)
Increase diet as tolerated, follow up with gi 

## 2022-05-16 NOTE — Assessment & Plan Note (Signed)
Getting labs today, continue rosuvastatin 5 mg daily

## 2022-05-16 NOTE — Assessment & Plan Note (Signed)
Had follow up with surgeon yesterday, patient reports she is doing well

## 2022-05-17 LAB — COMPLETE METABOLIC PANEL WITH GFR
AG Ratio: 2 (calc) (ref 1.0–2.5)
ALT: 8 U/L (ref 6–29)
AST: 11 U/L (ref 10–35)
Albumin: 4.3 g/dL (ref 3.6–5.1)
Alkaline phosphatase (APISO): 65 U/L (ref 37–153)
BUN: 10 mg/dL (ref 7–25)
CO2: 27 mmol/L (ref 20–32)
Calcium: 10.6 mg/dL — ABNORMAL HIGH (ref 8.6–10.4)
Chloride: 105 mmol/L (ref 98–110)
Creat: 0.79 mg/dL (ref 0.50–1.05)
Globulin: 2.2 g/dL (calc) (ref 1.9–3.7)
Glucose, Bld: 74 mg/dL (ref 65–99)
Potassium: 4.2 mmol/L (ref 3.5–5.3)
Sodium: 141 mmol/L (ref 135–146)
Total Bilirubin: 0.4 mg/dL (ref 0.2–1.2)
Total Protein: 6.5 g/dL (ref 6.1–8.1)
eGFR: 82 mL/min/{1.73_m2} (ref >=60)

## 2022-05-17 LAB — CBC WITH DIFFERENTIAL/PLATELET
Absolute Monocytes: 680 cells/uL (ref 200–950)
Basophils Absolute: 140 cells/uL (ref 0–200)
Basophils Relative: 1.4 %
Eosinophils Absolute: 330 cells/uL (ref 15–500)
Eosinophils Relative: 3.3 %
Hemoglobin: 16.2 g/dL — ABNORMAL HIGH (ref 11.7–15.5)
Lymphs Abs: 4190 cells/uL — ABNORMAL HIGH (ref 850–3900)
MCH: 30.9 pg (ref 27.0–33.0)
MCHC: 33.5 g/dL (ref 32.0–36.0)
Monocytes Relative: 6.8 %
Neutro Abs: 4660 cells/uL (ref 1500–7800)
Neutrophils Relative %: 46.6 %
Platelets: 405 10*3/uL — ABNORMAL HIGH (ref 140–400)
RBC: 5.25 10*6/uL — ABNORMAL HIGH (ref 3.80–5.10)
RDW: 12.8 % (ref 11.0–15.0)
Total Lymphocyte: 41.9 %

## 2022-05-17 LAB — LIPID PANEL
Cholesterol: 179 mg/dL (ref <200)
HDL: 45 mg/dL — ABNORMAL LOW (ref >=50)
LDL Cholesterol (Calc): 103 mg/dL (calc) — ABNORMAL HIGH
Non-HDL Cholesterol (Calc): 134 mg/dL (calc) — ABNORMAL HIGH (ref <130)
Total CHOL/HDL Ratio: 4 (calc) (ref <5.0)
Triglycerides: 191 mg/dL — ABNORMAL HIGH (ref <150)

## 2022-05-17 LAB — HEMOGLOBIN A1C
Hgb A1c MFr Bld: 5.2 % of total Hgb (ref <5.7)
Mean Plasma Glucose: 103 mg/dL
eAG (mmol/L): 5.7 mmol/L

## 2022-05-20 ENCOUNTER — Other Ambulatory Visit: Payer: Self-pay

## 2022-05-20 DIAGNOSIS — E7800 Pure hypercholesterolemia, unspecified: Secondary | ICD-10-CM

## 2022-05-20 MED ORDER — ROSUVASTATIN CALCIUM 5 MG PO TABS: 5.0000 mg | ORAL_TABLET | Freq: Every day | ORAL | 0 refills | Status: DC

## 2022-05-22 ENCOUNTER — Other Ambulatory Visit: Payer: Medicare Other

## 2022-05-22 NOTE — Patient Outreach (Signed)
  Medicaid Managed Care   Unsuccessful Outreach Note  05/22/2022 Name: Leslie Evans MRN: [id number] DOB: [birth year]  Referred by: Berniece Salines, FNP Reason for referral : High Risk Managed Medicaid (MM Social work unsuccessful telephone outreach)   An unsuccessful telephone outreach was attempted today. The patient was referred to the case management team for assistance with care management and care coordination.   Follow Up Plan: The care management team will reach out to the patient again over the next 7-10 days.   Abelino Derrick, MHA [facility]  Managed [facility] Social Worker [phone]

## 2022-05-22 NOTE — Patient Instructions (Signed)
  Medicaid Managed Care   Unsuccessful Outreach Note  05/22/2022 Name: Leslie Evans MRN: [id number] DOB: 01/04/1956  Referred by: Pender, Julie F, FNP Reason for referral : High Risk Managed Medicaid (MM Social work unsuccessful telephone outreach)   An unsuccessful telephone outreach was attempted today. The patient was referred to the case management team for assistance with care management and care coordination.   Follow Up Plan: The care management team will reach out to the patient again over the next 7-10 days.   Montoya Brandel, BSW, MHA [place]  Managed Medicaid Social Worker [phone]  

## 2022-05-29 ENCOUNTER — Telehealth: Payer: Self-pay

## 2022-05-29 NOTE — Telephone Encounter (Signed)
..   Medicaid Managed Care   Unsuccessful Outreach Note  05/29/2022 Name: Leslie Evans MRN: [id number] DOB: September 15, 1955  Referred by: Berniece Salines, FNP Reason for referral : Appointment (Called to reschedule her missed phone appointment with the MM BSW. I left my name and number on her voicemail.)   A second unsuccessful telephone outreach was attempted today. The patient was referred to the case management team for assistance with care management and care coordination.   Follow Up Plan: A HIPAA compliant phone message was left for the patient providing contact information and requesting a return call.  The care management team will reach out to the patient again over the next 7 days.    Weston Settle Care Guide  [facility] Managed  Care Guide [facility]  [phone]   [facility]

## 2022-06-02 ENCOUNTER — Other Ambulatory Visit: Payer: Self-pay | Admitting: Nurse Practitioner

## 2022-06-02 DIAGNOSIS — I10 Essential (primary) hypertension: Secondary | ICD-10-CM

## 2022-06-03 NOTE — Telephone Encounter (Signed)
Requested Prescriptions  Pending Prescriptions Disp Refills   lisinopril (ZESTRIL) 40 MG tablet [Pharmacy Med Name: Lisinopril 40 MG Oral Tablet] 90 tablet 0    Sig: Take 1 tablet by mouth once daily     Cardiovascular:  ACE Inhibitors Passed - 06/02/2022 12:27 PM      Passed - Cr in normal range and within 180 days    Creat  Date Value Ref Range Status  05/16/2022 0.79 0.50 - 1.05 mg/dL Final         Passed - K in normal range and within 180 days    Potassium  Date Value Ref Range Status  05/16/2022 4.2 3.5 - 5.3 mmol/L Final         Passed - Patient is not pregnant      Passed - Last BP in normal range    BP Readings from Last 1 Encounters:  05/16/22 112/82         Passed - Valid encounter within last 6 months    Recent Outpatient Visits           2 weeks ago Internal hernia   [facility] Berniece Salines, FNP   9 months ago Nausea and vomiting, unspecified vomiting type   [facility] Berniece Salines, FNP   1 year ago Essential hypertension   [facility] Berniece Salines, FNP   2 years ago Collagenous colitis   [facility] Danelle Berry, PA-C   2 years ago Encounter for screening mammogram for malignant neoplasm of breast   [facility] Danelle Berry, PA-C       Future Appointments             In 5 months Zane Herald, Rudolpho Sevin, FNP [facility], [facility]

## 2022-06-06 ENCOUNTER — Other Ambulatory Visit: Payer: Medicare Other

## 2022-06-06 NOTE — Patient Outreach (Signed)
Medicaid Managed Care Social Work Note  06/06/2022 Name:  Leslie Evans MRN:  [id number] DOB:  11-Jun-1955  Leslie Evans is an 67 y.o. year old female who is a primary patient of Leslie Salines, FNP.  The Medicaid Managed Care Coordination team was consulted for assistance with:  Community Resources   Leslie Evans was given information about Medicaid Managed Care Coordination team services today. Leslie Evans Patient agreed to services and verbal consent obtained.  Engaged with patient  for by telephone forinitial visit in response to referral for case management and/or care coordination services.   Assessments/Interventions:  Review of past medical history, allergies, medications, health status, including review of consultants reports, laboratory and other test data, was performed as part of comprehensive evaluation and provision of chronic care management services.  SDOH: (Social Determinant of Health) assessments and interventions performed: SDOH Interventions    Flowsheet Row Office Visit from 09/04/2021 in [facility] Office Visit from 02/17/2020 in [facility] Video Visit from 01/17/2020 in [facility] Office Visit from 02/04/2019 in [facility] Office Visit from 03/25/2018 in [facility]  SDOH Interventions       Depression Interventions/Treatment  Medication Medication Medication Medication PHQ2-9 Score <4 Follow-up Not Indicated     BSW completed a telephone outreach with patient, she states she will start getting her social security next month, but she is behind on all of her bills. She has been taking care of her adult son that was recently diagnosed with mental health. Patient does receive foodstamps, BSW will send resources for utilities via email to [email or web address]  Advanced Directives Status:  Not addressed in this encounter.  Care Plan                  Allergies  Allergen Reactions   Lamisil [Terbinafine] Anaphylaxis   Pregabalin Other (See Comments)   Tetracycline Other (See Comments)   Gabapentin Dermatitis   Ibuprofen Nausea And Vomiting    Medications Reviewed Today     Reviewed by Leslie Salines, FNP (Nurse Practitioner) on 05/16/22 at 1521  Med List Status: <None>   Medication Order Taking? Sig Documenting Provider Last Dose Status Informant  acetaminophen (TYLENOL) 500 MG tablet [id number] Yes Take 500 mg by mouth every 6 (six) hours as needed. [provider] Taking Active Self  albuterol (VENTOLIN HFA) 108 (90 Base) MCG/ACT inhaler [id number] Yes INHALE 2 PUFFS BY MOUTH EVERY 4 HOURS AS NEEDED FOR WHEEZING FOR SHORTNESS OF BREATH Leslie Salines, FNP Taking Active Self  budesonide (ENTOCORT EC) 3 MG 24 hr capsule [id number] Yes Take 3 capsules (9 mg total) by mouth once daily. Toney Reil, MD Taking Active Self  budesonide-formoterol [facility]) 160-4.5 MCG/ACT inhaler [id number] Yes Inhale 2 puffs into the lungs 2 (two) times daily. Leslie Salines, FNP Taking Active Self  cholecalciferol (VITAMIN D3) 25 MCG (1000 UNIT) tablet [id number] Yes Take 1,000 Units by mouth daily. [provider] Taking Active Self  citalopram (CELEXA) 40 MG tablet [id number] Yes Take 1 tablet by mouth once daily Leslie Salines, FNP Taking Active Self  dicyclomine (BENTYL) 20 MG tablet [id number] Yes Take 1 tablet (20 mg total) by mouth 3 (three) times daily as needed for spasms (abd cramping). Leslie Salines, FNP Taking Active Self  hydrOXYzine (VISTARIL) 25 MG capsule [id number] Yes TAKE 1 TO 2 CAPSULES BY MOUTH TWICE DAILY AS NEEDED FOR INSOMNIA  FOR ANXIETY Leslie Berry, PA-C Taking Active Self  ibuprofen (ADVIL) 200 MG tablet [id number] Yes Take 200 mg by mouth every 6 (six) hours as needed for mild pain. [provider] Taking Active Self  lisinopril (ZESTRIL) 40 MG tablet [id number] Yes Take 1 tablet by  mouth once daily Leslie Salines, FNP Taking Active Self  methocarbamol (ROBAXIN) 500 MG tablet [id number] Yes Take 1 tablet (500 mg total) by mouth every 8 (eight) hours as needed for up to 10 days for muscle spasms. Carolan Shiver, MD Taking Active   metoCLOPramide (REGLAN) 10 MG tablet [id number] Yes Take 1 tablet (10 mg total) by mouth every 6 (six) hours as needed for nausea or vomiting. Leslie Salines, FNP Taking Active Self  ondansetron (ZOFRAN ODT) 4 MG disintegrating tablet [id number] Yes Dissolve 1-2 tablets (4-8 mg total) by mouth once every 8 (eight) hours as needed for nausea or vomiting. Linwood Dibbles, MD Taking Active Self  oxyCODONE (OXY IR/ROXICODONE) 5 MG immediate release tablet [id number] Yes Take 1 tablet (5 mg total) by mouth every 4 (four) hours as needed for moderate pain or severe pain. Carolan Shiver, MD Taking Active   Pancrelipase, Lip-Prot-Amyl, [facility]) 40000-126000 units CPEP [id number] Yes Take 2 capsules with the first bite of each meal and 1 capsule with the first bite of each snack Vanga, Loel Dubonnet, MD Taking Active Self  pantoprazole (PROTONIX) 40 MG tablet [id number] Yes Take 1 tablet (40 mg total) by mouth daily. Linwood Dibbles, MD Taking Active Self  rosuvastatin (CRESTOR) 5 MG tablet [id number] Yes Take 1 tablet by mouth once daily Leslie Salines, FNP Taking Active Self  sucralfate (CARAFATE) 1 g tablet [id number] Yes Take 1 tablet (1 g total) by mouth 3 (three) times daily with meals as needed for GERD/reflux/epigastric pain when eating or drinking. Toney Reil, MD Taking Active Self  tiZANidine (ZANAFLEX) 4 MG tablet [id number] Yes Take 0.5-1 tablets (2-4 mg total) by mouth every 8 (eight) hours as needed for muscle spasms (muscle tightness). Leslie Salines, FNP Taking Active Self            Patient Active Problem List   Diagnosis Date Noted   Pure hypercholesterolemia 05/16/2022   Protein-calorie malnutrition, severe 05/09/2022   Internal  hernia 05/02/2022   Collagenous colitis 02/17/2020   Loss of weight    Essential hypertension 11/27/2014   Lumbar disc disease with radiculopathy 08/21/2014   Chronic anxiety 08/21/2014   Chronic obstructive pulmonary disease (HCC) 07/21/2013   Current tobacco use 07/21/2013    Conditions to be addressed/monitored per PCP order:   community resources  There are no care plans that you recently modified to display for this patient.   Follow up:  Patient agrees to Care Plan and Follow-up.  Plan: The Managed Medicaid care management team will reach out to the patient again over the next 30 days.  Date/time of next scheduled Social Work care management/care coordination outreach:  07/04/22  Gus Puma, Kenard Gower, [facility]  Managed [facility] Social Worker [phone]

## 2022-06-06 NOTE — Patient Instructions (Addendum)
  Medicaid Managed Care   Unsuccessful Outreach Note  06/06/2022 Name: Leslie Evans MRN: [id number] DOB: [birth year]  Referred by: Berniece Salines, FNP Reason for referral : High Risk Managed Medicaid (MM Social work  unsuccessful telephone outreach )   An unsuccessful telephone outreach was attempted today. The patient was referred to the case management team for assistance with care management and care coordination.   Follow Up Plan: The care management team will reach out to the patient again over the next 7-10 days.   Gus Puma, Kenard Gower, MHA [facility]  Managed Medicaid Social Worker [phone] Visit Information  The Patient                                              was given information about Medicaid Managed Care team care coordination services and consented to engagement with the [facility] Managed Care team.   Social Worker will follow up in 30 days.   Abelino Derrick, MHA [facility]  Managed [facility] Social Worker [phone]

## 2022-06-06 NOTE — Patient Outreach (Signed)
  Medicaid Managed Care   Unsuccessful Outreach Note  06/06/2022 Name: Leslie Evans MRN: [id number] DOB: [birth year]  Referred by: Berniece Salines, FNP Reason for referral : High Risk Managed Medicaid (MM Social work  unsuccessful telephone outreach )   An unsuccessful telephone outreach was attempted today. The patient was referred to the case management team for assistance with care management and care coordination.   Follow Up Plan: The care management team will reach out to the patient again over the next 7-10 days.   Abelino Derrick, MHA [facility]  Managed [facility] Social Worker [phone]

## 2022-06-13 ENCOUNTER — Encounter: Payer: Self-pay | Admitting: Nurse Practitioner

## 2022-06-13 ENCOUNTER — Other Ambulatory Visit: Payer: Self-pay | Admitting: Nurse Practitioner

## 2022-06-13 ENCOUNTER — Telehealth: Payer: Self-pay | Admitting: Gastroenterology

## 2022-06-13 DIAGNOSIS — J4489 Other specified chronic obstructive pulmonary disease: Secondary | ICD-10-CM

## 2022-06-13 DIAGNOSIS — K219 Gastro-esophageal reflux disease without esophagitis: Secondary | ICD-10-CM

## 2022-06-13 DIAGNOSIS — R109 Unspecified abdominal pain: Secondary | ICD-10-CM

## 2022-06-13 MED ORDER — ALBUTEROL SULFATE HFA 108 (90 BASE) MCG/ACT IN AERS
1.0000 | INHALATION_SPRAY | RESPIRATORY_TRACT | 5 refills | Status: DC | PRN
Start: 2022-06-13 — End: 2022-11-17

## 2022-06-13 MED ORDER — PANTOPRAZOLE SODIUM 40 MG PO TBEC: 40.0000 mg | DELAYED_RELEASE_TABLET | Freq: Every day | ORAL | 1 refills | Status: DC

## 2022-06-13 NOTE — Telephone Encounter (Signed)
Pt left vmm needing refill on protonix  [facility] on [address] please return call

## 2022-06-16 MED ORDER — PANTOPRAZOLE SODIUM 40 MG PO TBEC
40.0000 mg | DELAYED_RELEASE_TABLET | Freq: Every day | ORAL | 0 refills | Status: DC
Start: 2022-06-16 — End: 2022-11-17

## 2022-06-16 NOTE — Addendum Note (Signed)
Addended by: Radene Knee L on: 06/16/2022 08:10 AM   Modules accepted: Orders

## 2022-06-16 NOTE — Telephone Encounter (Signed)
Last office visit 06/27/2021 Collagenous colitis  Last refill 06/13/2022 1 refills  No show appointment on 12/25/2021 Has appointment 08/27/2022

## 2022-06-18 ENCOUNTER — Encounter: Payer: Self-pay | Admitting: Gastroenterology

## 2022-06-18 ENCOUNTER — Ambulatory Visit (INDEPENDENT_AMBULATORY_CARE_PROVIDER_SITE_OTHER): Payer: Medicare Other | Admitting: Gastroenterology

## 2022-06-18 VITALS — BP 121/73 | HR 54 | Temp 98.0°F | Ht 65.0 in | Wt 113.1 lb

## 2022-06-18 DIAGNOSIS — R110 Nausea: Secondary | ICD-10-CM

## 2022-06-18 DIAGNOSIS — K8681 Exocrine pancreatic insufficiency: Secondary | ICD-10-CM | POA: Diagnosis not present

## 2022-06-18 DIAGNOSIS — K52831 Collagenous colitis: Secondary | ICD-10-CM | POA: Diagnosis not present

## 2022-06-18 MED ORDER — BUDESONIDE 3 MG PO CPEP
6.0000 mg | ORAL_CAPSULE | Freq: Every day | ORAL | 1 refills | Status: AC
Start: 2022-06-18 — End: ?

## 2022-06-18 MED ORDER — ZENPEP 40000-126000 UNITS PO CPEP: ORAL_CAPSULE | ORAL | 5 refills | Status: DC

## 2022-06-18 MED ORDER — ONDANSETRON 4 MG PO TBDP
4.0000 mg | ORAL_TABLET | Freq: Three times a day (TID) | ORAL | 0 refills | Status: DC | PRN
Start: 2022-06-18 — End: 2023-03-04

## 2022-06-18 NOTE — Progress Notes (Signed)
Arlyss Repress, MD [address]  [address]  [place], [place] [address]  Main: [phone]  Fax: [phone]    Gastroenterology Consultation  Referring Provider:     Berniece Salines, FNP Primary Care Physician:  Berniece Salines, FNP Primary Gastroenterologist:  Dr. Arlyss Repress Reason for Consultation:    Severe EPI, collagenous colitis        HPI:   Leslie Evans is a 67 y.o. female referred by Dr. 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  Follow-up visit 06/27/2021 Patient is here for follow-up of EPI and collagenous colitis.  Patient continues to gain weight, she gained 5 to 8 pounds since last visit.  She is working as a Engineer, civil (consulting) at [employer], [employer] on surgery floor.  She continues to take Zenpep 3 capsules with each meal and 1 with snack.  She is taking budesonide 6 mg daily.  She is in good spirits today.  She does report episodes of loose stools, worse postprandial associated with especially when she has high calorie drinks.  She does not have any concerns today.  She took vitamin E Supplements.  She had bone density testing which revealed osteopenia.  Patient reports intermittent nausea for which she takes Zofran as needed  Follow-up visit 06/18/2022 Patient has not seen me for last 1 year.  She had bowel obstruction from internal hernia, s/p ex lap by Dr. Hazle Quant on 05/02/2022 when she was admitted to [facility] secondary to severe abdominal pain associated with nausea and vomiting.  She developed internal hernia secondary to adhesions, underwent extensive lysis of adhesions.  Patient has recovered well from surgery, has been regaining weight.  Appetite has improved.  Reports having 1-2 formed bowel movements daily.  She continues to take Zenpep, budesonide 3 mg 2 pills daily, Zofran as needed as well as Protonix 40 mg daily.  She is requesting refills on these medications.  She does  not have any GI concerns today.  She is currently out of job due to recent illness, deals with stress from her son who has schizophrenia.  NSAIDs: None  Antiplts/Anticoagulants/Anti thrombotics: None  GI Procedures: Colonoscopy approximately in 2004 EGD and colonoscopy 01/31/2020  - Normal examined duodenum. Biopsied. - Erosive gastropathy with no bleeding and no stigmata of recent bleeding. Biopsied. - Normal gastric body and incisura. Biopsied. - Small hiatal hernia. - Esophagogastric landmarks identified. - Normal gastroesophageal junction and esophagus.  - Hemorrhoids found on perianal exam. - The examined portion of the ileum was normal. - Granularity in the ascending colon and in the cecum. Biopsied. - Normal mucosa in the sigmoid colon, in the descending colon, in the transverse colon and in the distal ascending colon. Biopsied. - The rectum is normal. - Non-bleeding external hemorrhoids. - One 3 mm polyp in the descending colon, removed with a cold biopsy forceps. Resected and retrieved.  DIAGNOSIS:  A. DUODENUM, RANDOM; COLD BIOPSY:  - DUODENAL MUCOSA WITH NO SIGNIFICANT PATHOLOGIC ALTERATION.  - NEGATIVE FOR FEATURES OF CELIAC DISEASE.  - NEGATIVE FOR DYSPLASIA AND MALIGNANCY.   B. STOMACH, RANDOM; COLD BIOPSY:  - ANTRAL AND OXYNTIC MUCOSA WITH FOCAL, MINIMAL CHRONIC INFLAMMATION.  - NEGATIVE FOR ACTIVE INFLAMMATION AND H PYLORI.  - NEGATIVE FOR INTESTINAL METAPLASIA, DYSPLASIA, AND MALIGNANCY.   C. COLON, CECUM; COLD BIOPSY:  - COLLAGENOUS COLITIS.  - NEGATIVE FOR DYSPLASIA AND MALIGNANCY.   D. COLON, RANDOM; COLD BIOPSY:  -  COLLAGENOUS COLITIS.  - NEGATIVE FOR DYSPLASIA AND MALIGNANCY.   E. COLON POLYP, DESCENDING; COLD BIOPSY:  - FEATURES SUGGESTIVE OF HYPERPLASTIC POLYP.  - BACKGROUND FEATURES OF COLLAGENOUS COLITIS.  - NEGATIVE FOR DYSPLASIA AND MALIGNANCY.   Past Medical History:  Diagnosis Date   Anxiety    Asthma    Cervicalgia    COPD (chronic  obstructive pulmonary disease) (HCC)    COVID-19    H/O: hysterectomy 2004   Hyperlipidemia    Hypertension     Past Surgical History:  Procedure Laterality Date   ABDOMINAL HYSTERECTOMY     COLONOSCOPY WITH PROPOFOL N/A 01/31/2020   Procedure: COLONOSCOPY WITH PROPOFOL;  Surgeon: Toney Reil, MD;  Location: [facility] ENDOSCOPY;  Service: Gastroenterology;  Laterality: N/A;   ESOPHAGOGASTRODUODENOSCOPY N/A 01/31/2020   Procedure: ESOPHAGOGASTRODUODENOSCOPY (EGD);  Surgeon: Toney Reil, MD;  Location: [facility] ENDOSCOPY;  Service: Gastroenterology;  Laterality: N/A;   LAPAROTOMY  05/02/2022   Procedure: EXPLORATORY LAPAROTOMY;  Surgeon: Carolan Shiver, MD;  Location: [facility] ORS;  Service: General;;  with reduction of internal hernia   LYSIS OF ADHESION N/A 05/02/2022   Procedure: LYSIS OF ADHESION;  Surgeon: Carolan Shiver, MD;  Location: [facility] ORS;  Service: General;  Laterality: N/A;   TUBAL LIGATION      Current Outpatient Medications:    acetaminophen (TYLENOL) 500 MG tablet, Take 500 mg by mouth every 6 (six) hours as needed., Disp: , Rfl:    albuterol (VENTOLIN HFA) 108 (90 Base) MCG/ACT inhaler, Inhale 1-2 puffs into the lungs every 4 (four) hours as needed for wheezing or shortness of breath., Disp: 18 g, Rfl: 5   budesonide-formoterol (SYMBICORT) 160-4.5 MCG/ACT inhaler, Inhale 2 puffs into the lungs 2 (two) times daily., Disp: 10.2 g, Rfl: 3   cholecalciferol (VITAMIN D3) 25 MCG (1000 UNIT) tablet, Take 1,000 Units by mouth daily., Disp: , Rfl:    citalopram (CELEXA) 40 MG tablet, Take 1 tablet by mouth once daily, Disp: 90 tablet, Rfl: 0   dicyclomine (BENTYL) 20 MG tablet, Take 1 tablet (20 mg total) by mouth 3 (three) times daily as needed for spasms (abd cramping)., Disp: 20 tablet, Rfl: 0   hydrOXYzine (VISTARIL) 25 MG capsule, TAKE 1 TO 2 CAPSULES BY MOUTH TWICE DAILY AS NEEDED FOR INSOMNIA FOR ANXIETY, Disp: 120 capsule, Rfl: 1   ibuprofen (ADVIL) 200 MG  tablet, Take 200 mg by mouth every 6 (six) hours as needed for mild pain., Disp: , Rfl:    lisinopril (ZESTRIL) 40 MG tablet, Take 1 tablet by mouth once daily, Disp: 90 tablet, Rfl: 0   metoCLOPramide (REGLAN) 10 MG tablet, Take 1 tablet (10 mg total) by mouth every 6 (six) hours as needed for nausea or vomiting., Disp: 30 tablet, Rfl: 0   oxyCODONE (OXY IR/ROXICODONE) 5 MG immediate release tablet, Take 1 tablet (5 mg total) by mouth every 4 (four) hours as needed for moderate pain or severe pain., Disp: 20 tablet, Rfl: 0   pantoprazole (PROTONIX) 40 MG tablet, Take 1 tablet (40 mg total) by mouth daily., Disp: 90 tablet, Rfl: 0   rosuvastatin (CRESTOR) 5 MG tablet, Take 1 tablet (5 mg total) by mouth daily., Disp: 90 tablet, Rfl: 0   sucralfate (CARAFATE) 1 g tablet, Take 1 tablet (1 g total) by mouth 3 (three) times daily with meals as needed for GERD/reflux/epigastric pain when eating or drinking., Disp: 30 tablet, Rfl: 1   tiZANidine (ZANAFLEX) 4 MG tablet, Take 0.5-1 tablets (2-4 mg total) by mouth  every 8 (eight) hours as needed for muscle spasms (muscle tightness)., Disp: 30 tablet, Rfl: 1   budesonide (ENTOCORT EC) 3 MG 24 hr capsule, Take 2 capsules (6 mg total) by mouth daily., Disp: 180 capsule, Rfl: 1   ondansetron (ZOFRAN ODT) 4 MG disintegrating tablet, Dissolve 1-2 tablets (4-8 mg total) by mouth once every 8 (eight) hours as needed for nausea or vomiting., Disp: 30 tablet, Rfl: 0   Pancrelipase, Lip-Prot-Amyl, (ZENPEP) 40000-126000 units CPEP, Take 2 capsules with the first bite of each meal and 1 capsule with the first bite of each snack, Disp: 240 capsule, Rfl: 5   Family History  Problem Relation Age of Onset   Cancer Mother    Diabetes Mother    Heart disease Mother    Cancer Father    Drug abuse Brother    Diabetes Maternal Grandmother    COPD Maternal Grandfather    Cancer Brother      Social History   Tobacco Use   Smoking status: Every Day    Packs/day: .5     Types: Cigarettes   Smokeless tobacco: Never  Vaping Use   Vaping Use: Former   Substances: CBD  Substance Use Topics   Alcohol use: Yes    Alcohol/week: 0.0 standard drinks of alcohol    Comment: occ   Drug use: No    Allergies as of 06/18/2022 - Review Complete 06/18/2022  Allergen Reaction Noted   Lamisil [terbinafine] Anaphylaxis 11/27/2014   Pregabalin Other (See Comments) 08/21/2014   Tetracycline Other (See Comments) 08/21/2014   Gabapentin Dermatitis 08/21/2014   Ibuprofen Nausea And Vomiting 10/09/2015    Review of Systems:    All systems reviewed and negative except where noted in HPI.   Physical Exam:  BP 121/73 (BP Location: Left Arm, Patient Position: Sitting, Cuff Size: Normal)   Pulse (!) 54   Temp 98 F (36.7 C) (Oral)   Ht 5\' 5"  (1.651 m)   Wt 113 lb 2 oz (51.3 kg)   BMI 18.82 kg/m  No LMP recorded. Patient has had a hysterectomy.  General:   Alert, t thin built pleasant and cooperative in NAD Head:  Normocephalic and atraumatic. Eyes:  Sclera clear, no icterus.   Conjunctiva pink. Ears:  Normal auditory acuity. Nose:  No deformity, discharge, or lesions. Mouth:  No deformity or lesions,oropharynx pink & moist. Neck:  Supple; no masses or thyromegaly. Lungs:  Respirations even and unlabored.  Clear throughout to auscultation.   No wheezes, crackles, or rhonchi. No acute distress. Heart:  Regular rate and rhythm; no murmurs, clicks, rubs, or gallops. Abdomen:  Normal bowel sounds. Soft, scaphoid, nontender non-distended without masses, hepatosplenomegaly or hernias noted.  No guarding or rebound tenderness.  Midline vertical scar, well-healed Rectal: Not performed Msk:  Symmetrical without gross deformities. Good, equal movement & strength bilaterally. Pulses:  Normal pulses noted. Extremities:  No clubbing or edema.  No cyanosis. Neurologic:  Alert and oriented x3;  grossly normal neurologically. Skin:  Intact without significant lesions or rashes.  No jaundice. Psych:  Alert and cooperative. Normal mood and affect.  Imaging Studies: None  Assessment and Plan:   Leslie Evans is a 67 y.o. pleasant Caucasian female with chronic tobacco use, history of chronic nonbloody diarrhea with abdominal pain and unintentional weight loss, diagnosed with collagenous colitis and started on budesonide 9 mg daily.  She also underwent EGD with gastric, duodenal biopsies which were unremarkable.  Terminal ileum was normal.  TSH was normal.  Her diarrhea has significantly improved.  She continues to gain weight.  Pancreatic fecal elastase were severely low <50.  Internal hernia with bowel obstruction secondary to adhesions, s/p ex lap with extensive lysis of adhesions in 04/2022  Severe EPI: Continue Zenpep 40,000 units 2 to 3 capsules with each meal and 1 to 2 capsules with snack Discussed about high-protein diet Patient continues to smoke tobacco, advised her to quit  Osteopenia Normal vitamin D levels  Collagenous colitis: Continue budesonide 3 mg 2 pills daily   Grade 2 symptomatic hemorrhoids S/p hemorrhoid ligation in 2022  Follow up in 6 months with PA-C, Lavonna Monarch, MD

## 2022-07-04 ENCOUNTER — Other Ambulatory Visit: Payer: Medicare Other

## 2022-07-04 NOTE — Patient Instructions (Signed)
  Medicaid Managed Care   Unsuccessful Outreach Note  07/04/2022 Name: Teal H Ekstrand MRN: [id number] DOB: 06/02/1955  Referred by: Pender, Julie F, FNP Reason for referral : High Risk Managed Medicaid (MM Social work unsuccessful telephone outreach )   An unsuccessful telephone outreach was attempted today. The patient was referred to the case management team for assistance with care management and care coordination.   Follow Up Plan: A HIPAA compliant phone message was left for the patient providing contact information and requesting a return call.   Bryor Rami, BSW, MHA [facility]  Managed Medicaid Social Worker [phone]  

## 2022-07-04 NOTE — Patient Outreach (Signed)
  Medicaid Managed Care   Unsuccessful Outreach Note  07/04/2022 Name: Leslie Evans MRN: [id number] DOB: [birth year]  Referred by: Berniece Salines, FNP Reason for referral : High Risk Managed Medicaid (MM Social work unsuccessful telephone outreach )   An unsuccessful telephone outreach was attempted today. The patient was referred to the case management team for assistance with care management and care coordination.   Follow Up Plan: A HIPAA compliant phone message was left for the patient providing contact information and requesting a return call.   Abelino Derrick, MHA [facility]  Managed [facility] Social Worker [phone]

## 2022-07-23 ENCOUNTER — Other Ambulatory Visit: Payer: Self-pay | Admitting: Nurse Practitioner

## 2022-07-23 DIAGNOSIS — F419 Anxiety disorder, unspecified: Secondary | ICD-10-CM

## 2022-07-23 NOTE — Telephone Encounter (Signed)
Requested Prescriptions  Pending Prescriptions Disp Refills   citalopram (CELEXA) 40 MG tablet [Pharmacy Med Name: Citalopram Hydrobromide 40 MG Oral Tablet] 90 tablet 0    Sig: Take 1 tablet by mouth once daily     Psychiatry:  Antidepressants - SSRI Passed - 07/23/2022 10:25 AM      Passed - Valid encounter within last 6 months    Recent Outpatient Visits           2 months ago Internal hernia   [facility] Della Goo F, FNP   10 months ago Nausea and vomiting, unspecified vomiting type   [facility] Berniece Salines, FNP   1 year ago Essential hypertension   [facility] Berniece Salines, FNP   2 years ago Collagenous colitis   [facility] Danelle Berry, PA-C   2 years ago Encounter for screening mammogram for malignant neoplasm of breast   [facility] Danelle Berry, PA-C       Future Appointments             In 3 months Zane Herald, Rudolpho Sevin, FNP [facility]   In 4 months Celso Amy, PA-C [facility]

## 2022-07-25 ENCOUNTER — Telehealth: Payer: Self-pay

## 2022-07-25 NOTE — Telephone Encounter (Signed)
Submitted a PA through cover my meds for Budesonide 3mg . Waiting on response from insurance company

## 2022-07-28 NOTE — Telephone Encounter (Signed)
Approved 07/11/2022 until further  noticed

## 2022-08-27 ENCOUNTER — Telehealth: Payer: Self-pay | Admitting: Gastroenterology

## 2022-08-27 ENCOUNTER — Ambulatory Visit: Payer: Medicare Other | Admitting: Gastroenterology

## 2022-08-27 NOTE — Telephone Encounter (Signed)
Patient called and left a voicemail asking about her future appointment. I called her back to let her know her appointment is not until 12/19/22.

## 2022-08-28 ENCOUNTER — Ambulatory Visit (INDEPENDENT_AMBULATORY_CARE_PROVIDER_SITE_OTHER): Payer: Medicare Other

## 2022-08-28 VITALS — Ht 66.0 in | Wt 116.0 lb

## 2022-08-28 DIAGNOSIS — Z122 Encounter for screening for malignant neoplasm of respiratory organs: Secondary | ICD-10-CM

## 2022-08-28 DIAGNOSIS — Z0000 Encounter for general adult medical examination without abnormal findings: Secondary | ICD-10-CM

## 2022-08-28 DIAGNOSIS — Z1231 Encounter for screening mammogram for malignant neoplasm of breast: Secondary | ICD-10-CM

## 2022-08-28 DIAGNOSIS — H9193 Unspecified hearing loss, bilateral: Secondary | ICD-10-CM

## 2022-08-28 NOTE — Progress Notes (Signed)
Subjective:   Leslie Evans is a 67 y.o. female who presents for Medicare Annual (Subsequent) preventive examination.  Visit Complete: Virtual  I connected with  Teodora Medici Mielke on 16/10/96 by a audio enabled telemedicine application and verified that I am speaking with the correct person using two identifiers.  Patient Location: Home  Provider Location: Office/Clinic  I discussed the limitations of evaluation and management by telemedicine. The patient expressed understanding and agreed to proceed.  Vital Signs: Unable to obtain new vitals due to this being a telehealth visit.  Patient Medicare AWV questionnaire was completed by the patient on (not done); I have confirmed that all information answered by patient is correct and no changes since this date.  Review of Systems    Cardiac Risk Factors include: advanced age (>68men, >57 women);hypertension;sedentary lifestyle;smoking/ tobacco exposure    Objective:    Today's Vitals   08/28/22 1154 08/28/22 1156  Weight: 116 lb (52.6 kg)   Height: 5\' 6"  (1.676 m)   PainSc:  4    Body mass index is 18.72 kg/m.     08/28/2022   12:15 PM 05/02/2022    1:33 PM 05/02/2022    9:29 AM 01/31/2020    9:57 AM 04/08/2016   10:48 AM 10/09/2015    2:42 PM 07/16/2015   11:31 AM  Advanced Directives  Does Patient Have a Medical Advance Directive? No No No No No No No  Would patient like information on creating a medical advance directive?  No - Patient declined No - Patient declined   No - patient declined information No - patient declined information    Current Medications (verified) Outpatient Encounter Medications as of 08/28/2022  Medication Sig   acetaminophen (TYLENOL) 500 MG tablet Take 500 mg by mouth every 6 (six) hours as needed.   albuterol (VENTOLIN HFA) 108 (90 Base) MCG/ACT inhaler Inhale 1-2 puffs into the lungs every 4 (four) hours as needed for wheezing or shortness of breath.   budesonide (ENTOCORT EC) 3 MG 24 hr capsule  Take 2 capsules (6 mg total) by mouth daily.   budesonide-formoterol (SYMBICORT) 160-4.5 MCG/ACT inhaler Inhale 2 puffs into the lungs 2 (two) times daily.   cholecalciferol (VITAMIN D3) 25 MCG (1000 UNIT) tablet Take 1,000 Units by mouth daily.   citalopram (CELEXA) 40 MG tablet Take 1 tablet by mouth once daily   dicyclomine (BENTYL) 20 MG tablet Take 1 tablet (20 mg total) by mouth 3 (three) times daily as needed for spasms (abd cramping).   hydrOXYzine (VISTARIL) 25 MG capsule TAKE 1 TO 2 CAPSULES BY MOUTH TWICE DAILY AS NEEDED FOR INSOMNIA FOR ANXIETY   ibuprofen (ADVIL) 200 MG tablet Take 200 mg by mouth every 6 (six) hours as needed for mild pain.   lisinopril (ZESTRIL) 40 MG tablet Take 1 tablet by mouth once daily   metoCLOPramide (REGLAN) 10 MG tablet Take 1 tablet (10 mg total) by mouth every 6 (six) hours as needed for nausea or vomiting.   ondansetron (ZOFRAN ODT) 4 MG disintegrating tablet Dissolve 1-2 tablets (4-8 mg total) by mouth once every 8 (eight) hours as needed for nausea or vomiting.   oxyCODONE (OXY IR/ROXICODONE) 5 MG immediate release tablet Take 1 tablet (5 mg total) by mouth every 4 (four) hours as needed for moderate pain or severe pain.   Pancrelipase, Lip-Prot-Amyl, (ZENPEP) 40000-126000 units CPEP Take 2 capsules with the first bite of each meal and 1 capsule with the first bite of each snack  pantoprazole (PROTONIX) 40 MG tablet Take 1 tablet (40 mg total) by mouth daily.   rosuvastatin (CRESTOR) 5 MG tablet Take 1 tablet (5 mg total) by mouth daily.   sucralfate (CARAFATE) 1 g tablet Take 1 tablet (1 g total) by mouth 3 (three) times daily with meals as needed for GERD/reflux/epigastric pain when eating or drinking.   tiZANidine (ZANAFLEX) 4 MG tablet Take 0.5-1 tablets (2-4 mg total) by mouth every 8 (eight) hours as needed for muscle spasms (muscle tightness).   No facility-administered encounter medications on file as of 08/28/2022.    Allergies  (verified) Lamisil [terbinafine], Pregabalin, Tetracycline, Gabapentin, and Ibuprofen   History: Past Medical History:  Diagnosis Date   Anxiety    Asthma    Cervicalgia    COPD (chronic obstructive pulmonary disease) (HCC)    COVID-19    H/O: hysterectomy 2004   Hyperlipidemia    Hypertension    Past Surgical History:  Procedure Laterality Date   ABDOMINAL HYSTERECTOMY     COLONOSCOPY WITH PROPOFOL N/A 01/31/2020   Procedure: COLONOSCOPY WITH PROPOFOL;  Surgeon: Toney Reil, MD;  Location: [facility] ENDOSCOPY;  Service: Gastroenterology;  Laterality: N/A;   ESOPHAGOGASTRODUODENOSCOPY N/A 01/31/2020   Procedure: ESOPHAGOGASTRODUODENOSCOPY (EGD);  Surgeon: Toney Reil, MD;  [facility] END[facility]COPY;  Service: Gastroenterology;  Laterality: N/A;   LAPAROTOMY  05/02/2022   Procedure: EXPLORATORY LAPAROTOMY;  Surgeon: Carolan Shiver, MD;  Location: [facility] ORS;  Service: General;;  with reduction of internal hernia   LYSIS OF ADHESION N/A 05/02/2022   Procedure: LYSIS OF ADHESION;  Surgeon: Carolan Shiver, MD;  Location: [facility] ORS;  Service: General;  Laterality: N/A;   TUBAL LIGATION     Family History  Problem Relation Age of Onset   Cancer Mother    Diabetes Mother    Heart disease Mother    Cancer Father    Drug abuse Brother    Diabetes Maternal Grandmother    COPD Maternal Grandfather    Cancer Brother    Social History   Socioeconomic History   Marital status: Divorced    Spouse name: Not on file   Number of children: 3   Years of education: Not on file   Highest education level: Not on file  Occupational History   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 [place]   Tobacco Use   Smoking status: Every Day    Current packs/day: 0.50    Types: Cigarettes   Smokeless tobacco: Never  Vaping Use   Vaping status: Former   Substances: CBD  Substance and Sexual Activity   Alcohol use: Yes    Alcohol/week: 0.0 standard drinks of alcohol     Comment: occ   Drug use: No   Sexual activity: Not Currently  Other Topics Concern   Not on file  Social History Narrative   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[place],  Patient stated no insurance   Social Determinants of Health   Financial Resource Strain: Medium Risk (08/28/2022)   Overall Financial Resource Strain (CARDIA)    Difficulty of Paying Living Expenses: Somewhat hard  Food Insecurity: Food Insecurity Present (08/28/2022)   Hunger Vital Sign    Worried About Running Out of Food in the Last Year: Sometimes true    Ran Out of Food in the Last Year: Sometimes true  Transportation Needs: No Transportation Needs (08/28/2022)   PRAPARE - Administrator, Civil Service (Medical): No    Lack of Transportation (Non-Medical): No  Physical Activity: Inactive (08/28/2022)   Exercise Vital Sign    Days of Exercise per Week: 0 days    Minutes of Exercise per Session: 0 min  Stress: No Stress Concern Present (08/28/2022)   [employer] of Occupational Health - Occupational Stress Questionnaire    Feeling of Stress : Not at all  Social Connections: Socially Isolated (08/28/2022)   Social Connection and Isolation Panel [NHANES]    Frequency of Communication with Friends and Family: More than three times a week    Frequency of Social Gatherings with Friends and Family: Never    Attends Religious Services: Never    Database administrator or Organizations: No    Attends Banker Meetings: Never    Marital Status: Widowed    Tobacco Counseling Ready to quit: Not Answered Counseling given: Not Answered   Clinical Intake:  Pre-visit preparation completed: Yes  Pain : 0-10 Pain Score: 4  Pain Type: Chronic pain Pain Location: Hip (both hips) Pain Descriptors / Indicators: Aching Pain Onset: More than a month ago Pain Frequency: Intermittent Pain Relieving Factors: Tylenol, rest  Pain Relieving Factors: Tylenol, rest  BMI - recorded: 18.72 Nutritional  Status: BMI <19  Underweight Nutritional Risks: Nausea/ vomitting/ diarrhea (gag quick and vomits) Diabetes: No  How often do you need to have someone help you when you read instructions, pamphlets, or other written materials from your doctor or pharmacy?: 1 - Never  Interpreter Needed?: No  Comments: son lives with her Information entered by :: B.Jesua Tamblyn,LPN   Activities of Daily Living    08/28/2022   12:16 PM 05/16/2022    2:49 PM  In your present state of health, do you have any difficulty performing the following activities:  Hearing? 1 0  Vision? 1 0  Difficulty concentrating or making decisions? 1 0  Walking or climbing stairs? 1 0  Dressing or bathing? 0 0  Doing errands, shopping? 0 0  Preparing Food and eating ? N   Using the Toilet? N   In the past six months, have you accidently leaked urine? Y   Do you have problems with loss of bowel control? N   Managing your Medications? N   Managing your Finances? N   Housekeeping or managing your Housekeeping? N     Patient Care Team: Berniece Salines, FNP as PCP - General (Nurse Practitioner) Andee Poles, [facility] (Inactive) (Pharmacist) Shaune Leeks as Social Worker  Indicate any recent Medical Services you may have received from other than [facility] providers in the past year (date may be approximate).     Assessment:   This is a routine wellness examination for [place].  Hearing/Vision screen Hearing Screening - Comments:: Adequate hearing except in background noices Audiology referral requested Vision Screening - Comments:: Adequate vision sometimes floaters [facility]  Dietary issues and exercise activities discussed:     Goals Addressed               This Visit's Progress     I couldn't pick up my inhalers because they were too expensive (pt-stated)   Not on track     Current Barriers:  Financial Uninsured   Pharmacist Clinical Goal(s): Over the next 14 days, Ms.. Haning will provide the  necessary supplementary documents (proof of out of pocket prescription expenditure, proof of household income) needed for medication assistance applications to CCM pharmacist.   *Due to COVID19, application will have to be completed via mail*   Interventions: CCM  pharmacist will apply for medication assistance program for Symbicort, Proventil made by [employer].   Patient Self Care Activities:  Gather necessary documents needed to apply for medication assistance  Initial goal documentation         Depression Screen    08/28/2022   12:08 PM 05/16/2022    2:49 PM 09/04/2021   11:05 AM 12/14/2020    1:50 PM 02/29/2020    9:25 AM 02/17/2020   11:20 AM 02/17/2020   11:13 AM  PHQ 2/9 Scores  PHQ - 2 Score 4 0 2 0 2 1 1   PHQ- 9 Score 7 1 3  0 6 8     Fall Risk    08/28/2022   12:02 PM 05/16/2022    2:49 PM 09/04/2021   11:05 AM 12/14/2020    1:50 PM 02/29/2020    9:23 AM  Fall Risk   Falls in the past year? 1 0 0 0 0  Number falls in past yr: 0 0 0 0 0  Injury with Fall? 0 0 0 0 0  Risk for fall due to : No Fall Risks      Follow up Education provided;Falls prevention discussed  Falls evaluation completed Falls evaluation completed Falls evaluation completed    MEDICARE RISK AT HOME: Medicare Risk at Home Any stairs in or around the home?: Yes If so, are there any without handrails?: Yes Home free of loose throw rugs in walkways, pet beds, electrical cords, etc?: Yes Adequate lighting in your home to reduce risk of falls?: Yes Life alert?: No Use of a cane, walker or w/c?: No Grab bars in the bathroom?: No Shower chair or bench in shower?: Yes Elevated toilet seat or a handicapped toilet?: No  TIMED UP AND GO:  Was the test performed?  No    Cognitive Function:        08/28/2022   12:20 PM  6CIT Screen  What Year? 0 points  What month? 0 points  What time? 0 points  Count back from 20 0 points  Months in reverse 0 points  Repeat phrase 0 points  Total Score 0 points     Immunizations Immunization History  Administered Date(s) Administered   Influenza,inj,Quad PF,6+ Mos 08/21/2014, 10/09/2015, 03/10/2017, 09/07/2018   PFIZER(Purple Top)SARS-COV-2 Vaccination 12/07/2019, 02/03/2020   Td 04/06/2020    TDAP status: Up to date  Flu Vaccine status: Declined, Education has been provided regarding the importance of this vaccine but patient still declined. Advised may receive this vaccine at local pharmacy or Health Dept. Aware to provide a copy of the vaccination record if obtained from local pharmacy or Health Dept. Verbalized acceptance and understanding.  Pneumococcal vaccine status: Due, Education has been provided regarding the importance of this vaccine. Advised may receive this vaccine at local pharmacy or Health Dept. Aware to provide a copy of the vaccination record if obtained from local pharmacy or Health Dept. Verbalized acceptance and understanding.  Covid-19 vaccine status: Completed vaccines  Qualifies for Shingles Vaccine? Yes   Zostavax completed No   Shingrix Completed?: No.    Education has been provided regarding the importance of this vaccine. Patient has been advised to call insurance company to determine out of pocket expense if they have not yet received this vaccine. Advised may also receive vaccine at local pharmacy or Health Dept. Verbalized acceptance and understanding.  Screening Tests Health Maintenance  Topic Date Due   Pneumonia Vaccine 56+ Years old (1 of 2 - PCV) Never done  Zoster Vaccines- Shingrix (1 of 2) Never done   MAMMOGRAM  09/07/2014   COVID-19 Vaccine (3 - Pfizer risk series) 03/02/2020   INFLUENZA VACCINE  08/07/2022   Medicare Annual Wellness (AWV)  08/28/2023   Colonoscopy  01/30/2030   DTaP/Tdap/Td (2 - Tdap) 04/07/2030   DEXA SCAN  Completed   Hepatitis C Screening  Completed   HPV VACCINES  Aged Out    Health Maintenance  Health Maintenance Due  Topic Date Due   Pneumonia Vaccine 62+ Years old  (1 of 2 - PCV) Never done   Zoster Vaccines- Shingrix (1 of 2) Never done   MAMMOGRAM  09/07/2014   COVID-19 Vaccine (3 - Pfizer risk series) 03/02/2020   INFLUENZA VACCINE  08/07/2022    Colorectal cancer screening: Type of screening: Colonoscopy. Completed yes. Repeat every 5-10 years  Mammogram status: Ordered yes. Pt provided with contact info and advised to call to schedule appt.   Bone Density status: Completed yes. Results reflect: Bone density results: NORMAL. Repeat every 5 years.  Lung Cancer Screening: (Low Dose CT Chest recommended if Age 3-80 years, 20 pack-year currently smoking OR have quit w/in 15years.) does qualify.   Lung Cancer Screening Referral: yes  Additional Screening:  Hepatitis C Screening: does not qualify; Completed yes  Vision Screening: Recommended annual ophthalmology exams for early detection of glaucoma and other disorders of the eye. Is the patient up to date with their annual eye exam?  Yes  Who is the provider or what is the name of the office in which the patient attends annual eye exams? [facility] If pt is not established with a provider, would they like to be referred to a provider to establish care? No .   Dental Screening: Recommended annual dental exams for proper oral hygiene; Person [facility] (pt has cavities and needs cleaning)  Diabetic Foot Exam: n/a  Community Resource Referral / Chronic Care Management: CRR required this visit?  No   CCM required this visit?  Appt made with PCP    Plan:     I have personally reviewed and noted the following in the patient's chart:   Medical and social history Use of alcohol, tobacco or illicit drugs  Current medications and supplements including opioid prescriptions. Patient is currently taking opioid prescriptions. Information provided to patient regarding non-opioid alternatives. Patient advised to discuss non-opioid treatment plan with their provider. Functional ability and  status Nutritional status Physical activity Advanced directives List of other physicians Hospitalizations, surgeries, and ER visits in previous 12 months Vitals Screenings to include cognitive, depression, and falls Referrals and appointments  In addition, I have reviewed and discussed with patient certain preventive protocols, quality metrics, and best practice recommendations. A written personalized care plan for preventive services as well as general preventive health recommendations were provided to patient.     Sue Lush, LPN   [id number]   After Visit Summary: (MyChart) Due to this being a telephonic visit, the after visit summary with patients personalized plan was offered to patient via MyChart   Nurse Notes: Pt states she went to [place] 3 weeks ago and has had cough and congestion when returning. Pt denies any fever. She relays she is taking her inhalers as prescribed.Pt indicates she would like to see a therapist as medication does not help anymore. Appt made with PCP for 09/10/22.  *MMG, Audiology, and Lung Cancer Screening referrals made

## 2022-08-28 NOTE — Patient Instructions (Signed)
Leslie Evans , Thank you for taking time to come for your Medicare Wellness Visit. I appreciate your ongoing commitment to your health goals. Please review the following plan we discussed and let me know if I can assist you in the future.   Referrals/Orders/Follow-Ups/Clinician Recommendations: none  This is a list of the screening recommended for you and due dates:  Health Maintenance  Topic Date Due   Pneumonia Vaccine (1 of 2 - PCV) Never done   Zoster (Shingles) Vaccine (1 of 2) Never done   Mammogram  09/07/2014   COVID-19 Vaccine (3 - Pfizer risk series) 03/02/2020   Flu Shot  08/07/2022   Medicare Annual Wellness Visit  08/28/2023   Colon Cancer Screening  01/30/2030   DTaP/Tdap/Td vaccine (2 - Tdap) 04/07/2030   DEXA scan (bone density measurement)  Completed   Hepatitis C Screening  Completed   HPV Vaccine  Aged Out    Advanced directives: (Declined) Advance directive discussed with you today. Even though you declined this today, please call our office should you change your mind, and we can give you the proper paperwork for you to fill out.  Next Medicare Annual Wellness Visit scheduled for next year: Yes 09/10/2023 @ 11:15am telephone

## 2022-09-01 NOTE — Progress Notes (Unsigned)
There were no vitals taken for this visit.   Subjective:    Patient ID: Leslie Evans, female    DOB: [birth year], 67 y.o.   MRN: [id number]  HPI: Leslie Evans is a 67 y.o. female  No chief complaint on file.  URI Compliant: symptoms started *** -Fever: ** -Cough: *** -Shortness of breath: ** -Wheezing: *** -Chest congestion: *** -Nasal congestion: *** -Runny nose: *** -Post nasal drip: *** -Sore throat: *** -Sinus pressure: *** -Headache: *** -Face pain: *** -Ear pain:  *** -Ear pressure: *** -Relief with OTC cold/cough medications: yes   Relevant past medical, surgical, family and social history reviewed and updated as indicated. Interim medical history since our last visit reviewed. Allergies and medications reviewed and updated.  Review of Systems  Per HPI unless specifically indicated above     Objective:    There were no vitals taken for this visit.  Wt Readings from Last 3 Encounters:  08/28/22 116 lb (52.6 kg)  06/18/22 113 lb 2 oz (51.3 kg)  05/16/22 108 lb 12.8 oz (49.4 kg)    Physical Exam  Results for orders placed or performed in visit on 05/16/22  Lipid panel  Result Value Ref Range   Cholesterol 179 <200 mg/dL   HDL 45 (L) > OR = 50 mg/dL   Triglycerides 025 (H) <150 mg/dL   LDL Cholesterol (Calc) 103 (H) mg/dL (calc)   Total CHOL/HDL Ratio 4.0 <5.0 (calc)   Non-HDL Cholesterol (Calc) 134 (H) <130 mg/dL (calc)  COMPLETE METABOLIC PANEL WITH GFR  Result Value Ref Range   Glucose, Bld 74 65 - 99 mg/dL   BUN 10 7 - 25 mg/dL   Creat 4.27 0.62 - 3.76 mg/dL   eGFR 82 > OR = 60 EG/BTD/1.76H6   BUN/Creatinine Ratio SEE NOTE: 6 - 22 (calc)   Sodium 141 135 - 146 mmol/L   Potassium 4.2 3.5 - 5.3 mmol/L   Chloride 105 98 - 110 mmol/L   CO2 27 20 - 32 mmol/L   Calcium 10.6 (H) 8.6 - 10.4 mg/dL   Total Protein 6.5 6.1 - 8.1 g/dL   Albumin 4.3 3.6 - 5.1 g/dL   Globulin 2.2 1.9 - 3.7 g/dL (calc)   AG Ratio 2.0 1.0 - 2.5 (calc)   Total  Bilirubin 0.4 0.2 - 1.2 mg/dL   Alkaline phosphatase (APISO) 65 37 - 153 U/L   AST 11 10 - 35 U/L   ALT 8 6 - 29 U/L  Hemoglobin A1c  Result Value Ref Range   Hgb A1c MFr Bld 5.2 <5.7 % of total Hgb   Mean Plasma Glucose 103 mg/dL   eAG (mmol/L) 5.7 mmol/L  CBC with Differential/Platelet  Result Value Ref Range   WBC 10.0 3.8 - 10.8 Thousand/uL   RBC 5.25 (H) 3.80 - 5.10 Million/uL   Hemoglobin 16.2 (H) 11.7 - 15.5 g/dL   HCT 07.3 (H) 71.0 - 62.6 %   MCV 92.2 80.0 - 100.0 fL   MCH 30.9 27.0 - 33.0 pg   MCHC 33.5 32.0 - 36.0 g/dL   RDW 94.8 54.6 - 27.0 %   Platelets 405 (H) 140 - 400 Thousand/uL   MPV 11.1 7.5 - 12.5 fL   Neutro Abs 4,660 1,500 - 7,800 cells/uL   Lymphs Abs 4,190 (H) 850 - 3,900 cells/uL   Absolute Monocytes 680 200 - 950 cells/uL   Eosinophils Absolute 330 15 - 500 cells/uL   Basophils Absolute 140 0 - 200 cells/uL  Neutrophils Relative % 46.6 %   Total Lymphocyte 41.9 %   Monocytes Relative 6.8 %   Eosinophils Relative 3.3 %   Basophils Relative 1.4 %      Assessment & Plan:   Problem List Items Addressed This Visit   None    Follow up plan: No follow-ups on file.

## 2022-09-02 ENCOUNTER — Other Ambulatory Visit: Payer: Self-pay

## 2022-09-02 ENCOUNTER — Encounter: Payer: Self-pay | Admitting: Nurse Practitioner

## 2022-09-02 ENCOUNTER — Ambulatory Visit (INDEPENDENT_AMBULATORY_CARE_PROVIDER_SITE_OTHER): Payer: Medicare Other | Admitting: Nurse Practitioner

## 2022-09-02 VITALS — BP 120/72 | HR 90 | Temp 98.5°F | Resp 16 | Ht 66.0 in | Wt 115.2 lb

## 2022-09-02 DIAGNOSIS — H16223 Keratoconjunctivitis sicca, not specified as Sjogren's, bilateral: Secondary | ICD-10-CM | POA: Diagnosis not present

## 2022-09-02 DIAGNOSIS — J0140 Acute pansinusitis, unspecified: Secondary | ICD-10-CM | POA: Diagnosis not present

## 2022-09-02 DIAGNOSIS — R051 Acute cough: Secondary | ICD-10-CM | POA: Diagnosis not present

## 2022-09-02 DIAGNOSIS — H524 Presbyopia: Secondary | ICD-10-CM | POA: Diagnosis not present

## 2022-09-02 DIAGNOSIS — H2513 Age-related nuclear cataract, bilateral: Secondary | ICD-10-CM | POA: Diagnosis not present

## 2022-09-02 MED ORDER — HYDROCOD POLI-CHLORPHE POLI ER 10-8 MG/5ML PO SUER
5.0000 mL | Freq: Two times a day (BID) | ORAL | 0 refills | Status: AC | PRN
Start: 2022-09-02 — End: ?

## 2022-09-02 MED ORDER — BENZONATATE 100 MG PO CAPS
200.0000 mg | ORAL_CAPSULE | Freq: Two times a day (BID) | ORAL | 0 refills | Status: DC | PRN
Start: 2022-09-02 — End: 2022-11-17

## 2022-09-02 MED ORDER — AMOXICILLIN-POT CLAVULANATE 875-125 MG PO TABS
1.0000 | ORAL_TABLET | Freq: Two times a day (BID) | ORAL | 0 refills | Status: AC
Start: 2022-09-02 — End: 2022-09-12

## 2022-09-02 NOTE — Telephone Encounter (Signed)
Copied from CRM [id number]. Topic: General - Other >> Sep 02, 2022 12:09 PM Dominique A wrote: Reason for CRM: Pt states that the medication that her PCP called in for her today she is not able to get at the Pharmacy. Per the Pharmacy her Medicaid insurance will not cover the medication. Pt is going to have to wait until 09/09/2022 when she will get insurance to get the medication.  amoxicillin-clavulanate (AUGMENTIN) 875-125 MG tablet chlorpheniramine-HYDROcodone (TUSSIONEX) 10-8 MG/5ML

## 2022-09-03 ENCOUNTER — Other Ambulatory Visit: Payer: Self-pay | Admitting: Nurse Practitioner

## 2022-09-03 DIAGNOSIS — R051 Acute cough: Secondary | ICD-10-CM

## 2022-09-03 MED ORDER — PROMETHAZINE-DM 6.25-15 MG/5ML PO SYRP
5.0000 mL | ORAL_SOLUTION | Freq: Four times a day (QID) | ORAL | 0 refills | Status: DC | PRN
Start: 2022-09-03 — End: 2022-11-17

## 2022-09-03 NOTE — Telephone Encounter (Signed)
Insurance will not cover tessalon perles or the tussionex.

## 2022-09-03 NOTE — Telephone Encounter (Signed)
Please send in something different

## 2022-09-15 ENCOUNTER — Other Ambulatory Visit: Payer: Self-pay | Admitting: Nurse Practitioner

## 2022-09-15 DIAGNOSIS — I10 Essential (primary) hypertension: Secondary | ICD-10-CM

## 2022-09-16 ENCOUNTER — Other Ambulatory Visit: Payer: Self-pay | Admitting: Nurse Practitioner

## 2022-09-16 DIAGNOSIS — E7800 Pure hypercholesterolemia, unspecified: Secondary | ICD-10-CM

## 2022-09-17 NOTE — Telephone Encounter (Signed)
Requested Prescriptions  Pending Prescriptions Disp Refills   rosuvastatin (CRESTOR) 5 MG tablet [Pharmacy Med Name: Rosuvastatin Calcium 5 MG Oral Tablet] 90 tablet 0    Sig: Take 1 tablet by mouth once daily     Cardiovascular:  Antilipid - Statins 2 Failed - 09/16/2022 11:58 AM      Failed - Lipid Panel in normal range within the last 12 months    Cholesterol  Date Value Ref Range Status  05/16/2022 179 <200 mg/dL Final   LDL Cholesterol (Calc)  Date Value Ref Range Status  05/16/2022 103 (H) mg/dL (calc) Final    Comment:    Reference range: <100 . Desirable range <100 mg/dL for primary prevention;   <70 mg/dL for patients with CHD or diabetic patients  with > or = 2 CHD risk factors. Marland Kitchen LDL-C is now calculated using the Martin-Hopkins  calculation, which is a validated novel method providing  better accuracy than the Friedewald equation in the  estimation of LDL-C.  Horald Pollen et al. Lenox Ahr. 1610;960(45): 2061-2068  (http://education.[facility].com/faq/FAQ164)    HDL  Date Value Ref Range Status  05/16/2022 45 (L) > OR = 50 mg/dL Final   Triglycerides  Date Value Ref Range Status  05/16/2022 191 (H) <150 mg/dL Final         Passed - Cr in normal range and within 360 days    Creat  Date Value Ref Range Status  05/16/2022 0.79 0.50 - 1.05 mg/dL Final         Passed - Patient is not pregnant      Passed - Valid encounter within last 12 months    Recent Outpatient Visits           2 weeks ago Acute non-recurrent pansinusitis   [facility] Berniece Salines, FNP   4 months ago Internal hernia   [facility] Della Goo F, FNP   1 year ago Nausea and vomiting, unspecified vomiting type   [facility] Berniece Salines, FNP   1 year ago Essential hypertension   [facility] Berniece Salines, FNP   2 years ago Collagenous colitis   [facility] Danelle Berry, PA-C       Future Appointments             In 2 months Zane Herald, Rudolpho Sevin, FNP [facility]   In 3 months Celso Amy, PA-C [facility]

## 2022-09-17 NOTE — Telephone Encounter (Signed)
Requested Prescriptions  Pending Prescriptions Disp Refills   lisinopril (ZESTRIL) 40 MG tablet [Pharmacy Med Name: Lisinopril 40 MG Oral Tablet] 90 tablet 0    Sig: Take 1 tablet by mouth once daily     Cardiovascular:  ACE Inhibitors Passed - 09/15/2022  5:19 PM      Passed - Cr in normal range and within 180 days    Creat  Date Value Ref Range Status  05/16/2022 0.79 0.50 - 1.05 mg/dL Final         Passed - K in normal range and within 180 days    Potassium  Date Value Ref Range Status  05/16/2022 4.2 3.5 - 5.3 mmol/L Final         Passed - Patient is not pregnant      Passed - Last BP in normal range    BP Readings from Last 1 Encounters:  09/02/22 120/72         Passed - Valid encounter within last 6 months    Recent Outpatient Visits           2 weeks ago Acute non-recurrent pansinusitis   [facility] Berniece Salines, FNP   4 months ago Internal hernia   [facility] Della Goo F, FNP   1 year ago Nausea and vomiting, unspecified vomiting type   [facility] Berniece Salines, FNP   1 year ago Essential hypertension   [facility] Berniece Salines, FNP   2 years ago Collagenous colitis   [facility] Danelle Berry, PA-C       Future Appointments             In 2 months Zane Herald, Rudolpho Sevin, FNP [facility]   In 3 months Celso Amy, PA-C [facility] at [facility]

## 2022-09-25 ENCOUNTER — Other Ambulatory Visit: Payer: Self-pay | Admitting: Nurse Practitioner

## 2022-09-25 DIAGNOSIS — R112 Nausea with vomiting, unspecified: Secondary | ICD-10-CM

## 2022-09-26 ENCOUNTER — Other Ambulatory Visit: Payer: Self-pay

## 2022-09-26 DIAGNOSIS — R112 Nausea with vomiting, unspecified: Secondary | ICD-10-CM

## 2022-09-26 NOTE — Telephone Encounter (Signed)
Requested medications are due for refill today.  yes  Requested medications are on the active medications list.  yes  Last refill. 09/04/2021 #30 0 rf  Future visit scheduled.   yes  Notes to clinic.  Refill not delegated.    Requested Prescriptions  Pending Prescriptions Disp Refills   metoCLOPramide (REGLAN) 10 MG tablet [Pharmacy Med Name: Metoclopramide HCl 10 MG Oral Tablet] 30 tablet 0    Sig: TAKE 1 TABLET BY MOUTH EVERY 6 HOURS AS NEEDED FOR NAUSEA FOR VOMITING     Not Delegated - Gastroenterology: Antiemetics - metoclopramide Failed - 09/25/2022 10:01 AM      Failed - This refill cannot be delegated      Passed - Cr in normal range and within 360 days    Creat  Date Value Ref Range Status  05/16/2022 0.79 0.50 - 1.05 mg/dL Final         Passed - Valid encounter within last 6 months    Recent Outpatient Visits           3 weeks ago Acute non-recurrent pansinusitis   [facility] Berniece Salines, FNP   4 months ago Internal hernia   [facility] Della Goo F, FNP   1 year ago Nausea and vomiting, unspecified vomiting type   [facility] Berniece Salines, FNP   1 year ago Essential hypertension   [facility] Berniece Salines, FNP   2 years ago Collagenous colitis   [facility] Danelle Berry, PA-C       Future Appointments             In 1 month Zane Herald, Rudolpho Sevin, FNP [facility]   In 2 months Celso Amy, PA-C [facility] at [facility]

## 2022-10-24 DIAGNOSIS — H524 Presbyopia: Secondary | ICD-10-CM | POA: Diagnosis not present

## 2022-10-29 ENCOUNTER — Ambulatory Visit: Payer: Medicare Other | Attending: Nurse Practitioner | Admitting: Audiologist

## 2022-10-29 DIAGNOSIS — H903 Sensorineural hearing loss, bilateral: Secondary | ICD-10-CM | POA: Diagnosis not present

## 2022-10-29 NOTE — Procedures (Signed)
Outpatient Audiology and [facility] [address] [place], [place]  [address] [phone]  AUDIOLOGICAL  EVALUATION  NAME: [name]     DOB:   [birth year]      MRN: [id number]                                                                                     DATE: 10/29/2022     REFERENT: Berniece Salines, FNP STATUS: Outpatient DIAGNOSIS: Sensorineural Hearing Loss Bilateral    History: Leslie Evans was seen for an audiological evaluation due to muffled hearing and sinus issues. Leslie Evans gets drainage from her sinuses that cause her ears to feel clogged. She gets muffled hearing when this happens and her ears will not clear. She has recently finished antibiotics and her ears have cleared. Her son is frustrated he has to repeat himself often. She denies any pain, pressure, tinnitus, or fullness today. Leslie Evans also notes she is under a lot of stress from personal health issues and social issues with family.    Evaluation:  Otoscopy showed a clear view of the tympanic membranes, bilaterally Tympanometry results were consistent with normal middle ear function, bilaterally   Audiometric testing was completed using Conventional Audiometry techniques with insert earphones and TDH headphones. Test results are consistent with normal hearing sloping after 1.5kHz to mild to moderate sensorineural hearing loss. Speech Recognition Thresholds were obtained at 25 dB HL in the right ear and at 25 dB HL in the left ear. Word Recognition Testing was completed at 65 dB HL and Leslie Evans scored 100% in each ear.    Results:  The test results were reviewed with Leslie Evans. She has a mild to moderate sensorineural  hearing loss bilaterally. This is an age related hearing loss, not due to middle ear dysfunction. Leslie Evans could benefit form OTC hearing aids. She was given a handout with some OTC options.   Recommendations: 1.   Mild sensorineural hearing loss, she can use OTC hearing aids for additional support at  work. Have hearing tested again annually.    32 minutes spent testing and counseling on results.   If you have any questions please feel free to contact me at [phone].  Marton Redwood Audiologist, Au.D., CCC-A 10/29/2022  11:09 AM  Cc: Berniece Salines, FNP

## 2022-11-17 ENCOUNTER — Ambulatory Visit (INDEPENDENT_AMBULATORY_CARE_PROVIDER_SITE_OTHER): Payer: Medicare Other | Admitting: Nurse Practitioner

## 2022-11-17 ENCOUNTER — Other Ambulatory Visit: Payer: Self-pay

## 2022-11-17 ENCOUNTER — Encounter: Payer: Self-pay | Admitting: Nurse Practitioner

## 2022-11-17 VITALS — BP 118/74 | HR 96 | Temp 97.7°F | Resp 16 | Ht 66.0 in | Wt 113.1 lb

## 2022-11-17 DIAGNOSIS — Z23 Encounter for immunization: Secondary | ICD-10-CM | POA: Diagnosis not present

## 2022-11-17 DIAGNOSIS — J4489 Other specified chronic obstructive pulmonary disease: Secondary | ICD-10-CM

## 2022-11-17 DIAGNOSIS — M5416 Radiculopathy, lumbar region: Secondary | ICD-10-CM | POA: Diagnosis not present

## 2022-11-17 DIAGNOSIS — K219 Gastro-esophageal reflux disease without esophagitis: Secondary | ICD-10-CM | POA: Diagnosis not present

## 2022-11-17 DIAGNOSIS — M25531 Pain in right wrist: Secondary | ICD-10-CM

## 2022-11-17 DIAGNOSIS — E7800 Pure hypercholesterolemia, unspecified: Secondary | ICD-10-CM

## 2022-11-17 DIAGNOSIS — J449 Chronic obstructive pulmonary disease, unspecified: Secondary | ICD-10-CM

## 2022-11-17 DIAGNOSIS — I10 Essential (primary) hypertension: Secondary | ICD-10-CM

## 2022-11-17 DIAGNOSIS — M25551 Pain in right hip: Secondary | ICD-10-CM

## 2022-11-17 DIAGNOSIS — K52831 Collagenous colitis: Secondary | ICD-10-CM

## 2022-11-17 DIAGNOSIS — M25552 Pain in left hip: Secondary | ICD-10-CM | POA: Diagnosis not present

## 2022-11-17 DIAGNOSIS — F419 Anxiety disorder, unspecified: Secondary | ICD-10-CM | POA: Diagnosis not present

## 2022-11-17 DIAGNOSIS — Z131 Encounter for screening for diabetes mellitus: Secondary | ICD-10-CM

## 2022-11-17 MED ORDER — LISINOPRIL 40 MG PO TABS: 40.0000 mg | ORAL_TABLET | Freq: Every day | ORAL | 3 refills | Status: DC

## 2022-11-17 MED ORDER — ALPRAZOLAM 0.5 MG PO TABS: 0.5000 mg | ORAL_TABLET | Freq: Three times a day (TID) | ORAL | 1 refills | Status: AC | PRN

## 2022-11-17 MED ORDER — MELOXICAM 7.5 MG PO TABS: 7.5000 mg | ORAL_TABLET | Freq: Every day | ORAL | 1 refills | Status: DC

## 2022-11-17 MED ORDER — TIZANIDINE HCL 4 MG PO TABS: 2.0000 mg | ORAL_TABLET | Freq: Three times a day (TID) | ORAL | 1 refills | Status: DC | PRN

## 2022-11-17 MED ORDER — PANTOPRAZOLE SODIUM 40 MG PO TBEC: 40.0000 mg | DELAYED_RELEASE_TABLET | Freq: Every day | ORAL | 3 refills | Status: DC

## 2022-11-17 MED ORDER — CITALOPRAM HYDROBROMIDE 40 MG PO TABS: 40.0000 mg | ORAL_TABLET | Freq: Every day | ORAL | 3 refills | Status: DC

## 2022-11-17 MED ORDER — ALBUTEROL SULFATE HFA 108 (90 BASE) MCG/ACT IN AERS: 1.0000 | INHALATION_SPRAY | RESPIRATORY_TRACT | 5 refills | Status: DC | PRN

## 2022-11-17 NOTE — Assessment & Plan Note (Signed)
Patient reports she is having more anxiety attacks.  Patient was previously on alprazolam years ago.  Will get this to her as needed.  Short period of time.

## 2022-11-17 NOTE — Progress Notes (Signed)
BP 118/74   Pulse 96   Temp 97.7 F (36.5 C) (Oral)   Resp 16   Ht 5\' 6"  (1.676 m)   Wt 113 lb 1.6 oz (51.3 kg)   SpO2 98%   BMI 18.25 kg/m    Subjective:    Patient ID: Leslie Evans, female    DOB: [birth year], 67 y.o.   MRN: [id number]  HPI: Leslie Evans is a 67 y.o. female  Chief Complaint  Patient presents with   Medical Management of Chronic Issues   Hypertension:  -Medications: lisinopril 40 mg daily -Patient is compliant with above medications and reports no side effects. -Denies any SOB, CP, vision changes, LE edema or symptoms of hypotension -Diet: recommend DASH diet  -Exercise: recommend 150 min of physical activity weekly       11/17/2022    9:49 AM 09/02/2022    9:30 AM 08/28/2022   11:54 AM  Vitals with BMI  Height 5\' 6"  5\' 6"  5\' 6"   Weight 113 lbs 2 oz 115 lbs 3 oz 116 lbs  BMI 18.26 18.6 18.73  Systolic 118 120 --  Diastolic 74 72 --  Pulse 96 90      HLD:  -Medications: rosuvastatin 5 mg daily -Patient is compliant with above medications and reports no side effects.  -Last lipid panel:  Lipid Panel     Component Value Date/Time   CHOL 179 05/16/2022 1513   TRIG 191 (H) 05/16/2022 1513   HDL 45 (L) 05/16/2022 1513   CHOLHDL 4.0 05/16/2022 1513   VLDL 14 09/25/2015 1149   LDLCALC 103 (H) 05/16/2022 1513      The 10-year ASCVD risk score (Arnett DK, et al., 2019) is: 13.7%   Values used to calculate the score:     Age: 13 years     Sex: Female     Is Non-Hispanic African American: No     Diabetic: No     Tobacco smoker: Yes     Systolic Blood Pressure: 118 mmHg     Is BP treated: Yes     HDL Cholesterol: 45 mg/dL     Total Cholesterol: 179 mg/dL    COPD: She continues to smoke, she is down to 1/2 pack she says she is going to quit smoking this year.  She uses albuterol as needed and Symbicort.  She reports she is doing well. No changes. Refill sent   Anxiety: she is currently taking celexa 40 mg daily. She is taking hydroxyzine  as needed at bedtime. She says that her son is in the hospital and she has been very stressed lately. She says that her anxiety has been worse. She says she needs something that can help with the anxiety attacks.  She previously was on alprazolam.  Will give her a prescription for as needed.      11/17/2022    9:53 AM 09/02/2022    9:30 AM 08/28/2022   12:08 PM 05/16/2022    2:49 PM 09/04/2021   11:05 AM  Depression screen PHQ 2/9  Decreased Interest 0 0 2 0 1  Down, Depressed, Hopeless 1 0 2 0 1  PHQ - 2 Score 1 0 4 0 2  Altered sleeping   1 1 0  Tired, decreased energy 1  1 0 1  Change in appetite 1  1 0 0  Feeling bad or failure about yourself  1  0 0 0  Trouble concentrating 0  0 0 0  Moving  slowly or fidgety/restless 0  0 0 0  Suicidal thoughts 0  0 0 0  PHQ-9 Score   7 1 3   Difficult doing work/chores Not difficult at all   Not difficult at all Not difficult at all       11/17/2022    9:53 AM 05/16/2022    2:50 PM 09/04/2021   11:05 AM 12/14/2020    1:50 PM  GAD 7 : Generalized Anxiety Score  Nervous, Anxious, on Edge 1 1 0 0  Control/stop worrying 1 0 0 0  Worry too much - different things 1 1 0 0  Trouble relaxing 1 1 0 0  Restless 1 0 0 0  Easily annoyed or irritable 1 1 0 0  Afraid - awful might happen 1 1 0 0  Total GAD 7 Score 7 5 0 0  Anxiety Difficulty Somewhat difficult Not difficult at all Not difficult at all Not difficult at all   Colitis/hx of pancreatitis/GERD: She continues to take budesonide 3 mg daily. Pantoprazole 40 mg daily,   she says she is doing well and has not been having any problems of late. She says her diarrhea has improved. She is still having episodes but doing much better. Last saw GI on 06/18/2022  Bilateral hip and wrist pain/ she reports she has had bilateral hip pain right worse than left for years.  Patient denies any new injury.  Patient also reports that she has got bilateral wrist pain.  Patient has been seen and has had injections  before.  Patient is is not currently on meloxicam.  Will send in prescription for meloxicam.   Lumbar radiculopathy: Patient needed a refill on her tizanidine.  Patient states this works well for her.  Refill sent.  Relevant past medical, surgical, family and social history reviewed and updated as indicated. Interim medical history since our last visit reviewed. Allergies and medications reviewed and updated.  Review of Systems  Constitutional: Negative for fever , positive for weight change.  Respiratory: Negative for cough and shortness of breath.   Cardiovascular: Negative for chest pain or palpitations.  Gastrointestinal: Negative for abdominal pain, no bowel changes.  Musculoskeletal: Negative for gait problem or joint swelling.  Positive for bilateral hip pain, wrist pain Skin: Negative for rash.  Neurological: Negative for dizziness or headache.  No other specific complaints in a complete review of systems (except as listed in HPI above).      Objective:    BP 118/74   Pulse 96   Temp 97.7 F (36.5 C) (Oral)   Resp 16   Ht 5\' 6"  (1.676 m)   Wt 113 lb 1.6 oz (51.3 kg)   SpO2 98%   BMI 18.25 kg/m   Wt Readings from Last 3 Encounters:  11/17/22 113 lb 1.6 oz (51.3 kg)  09/02/22 115 lb 3.2 oz (52.3 kg)  08/28/22 116 lb (52.6 kg)    Physical Exam  Constitutional: Patient appears well-developed and well-nourished. underweight No distress.  HEENT: head atraumatic, normocephalic, pupils equal and reactive to light, neck supple Cardiovascular: Normal rate, regular rhythm and normal heart sounds.  No murmur heard. No BLE edema. Pulmonary/Chest: Effort normal and breath sounds normal. No respiratory distress. Abdominal: Soft.  There is no tenderness. Abdominal binder on Psychiatric: Patient has a normal mood and affect. behavior is normal. Judgment and thought content normal.  Results for orders placed or performed in visit on 05/16/22  Lipid panel  Result Value Ref Range  Cholesterol 179 <200 mg/dL   HDL 45 (L) > OR = 50 mg/dL   Triglycerides 063 (H) <150 mg/dL   LDL Cholesterol (Calc) 103 (H) mg/dL (calc)   Total CHOL/HDL Ratio 4.0 <5.0 (calc)   Non-HDL Cholesterol (Calc) 134 (H) <130 mg/dL (calc)  COMPLETE METABOLIC PANEL WITH GFR  Result Value Ref Range   Glucose, Bld 74 65 - 99 mg/dL   BUN 10 7 - 25 mg/dL   Creat 0.16 0.10 - 9.32 mg/dL   eGFR 82 > OR = 60 TF/TDD/2.20U5   BUN/Creatinine Ratio SEE NOTE: 6 - 22 (calc)   Sodium 141 135 - 146 mmol/L   Potassium 4.2 3.5 - 5.3 mmol/L   Chloride 105 98 - 110 mmol/L   CO2 27 20 - 32 mmol/L   Calcium 10.6 (H) 8.6 - 10.4 mg/dL   Total Protein 6.5 6.1 - 8.1 g/dL   Albumin 4.3 3.6 - 5.1 g/dL   Globulin 2.2 1.9 - 3.7 g/dL (calc)   AG Ratio 2.0 1.0 - 2.5 (calc)   Total Bilirubin 0.4 0.2 - 1.2 mg/dL   Alkaline phosphatase (APISO) 65 37 - 153 U/L   AST 11 10 - 35 U/L   ALT 8 6 - 29 U/L  Hemoglobin A1c  Result Value Ref Range   Hgb A1c MFr Bld 5.2 <5.7 % of total Hgb   Mean Plasma Glucose 103 mg/dL   eAG (mmol/L) 5.7 mmol/L  CBC with Differential/Platelet  Result Value Ref Range   WBC 10.0 3.8 - 10.8 Thousand/uL   RBC 5.25 (H) 3.80 - 5.10 Million/uL   Hemoglobin 16.2 (H) 11.7 - 15.5 g/dL   HCT 42.7 (H) 06.2 - 37.6 %   MCV 92.2 80.0 - 100.0 fL   MCH 30.9 27.0 - 33.0 pg   MCHC 33.5 32.0 - 36.0 g/dL   RDW 28.3 15.1 - 76.1 %   Platelets 405 (H) 140 - 400 Thousand/uL   MPV 11.1 7.5 - 12.5 fL   Neutro Abs 4,660 1,500 - 7,800 cells/uL   Lymphs Abs 4,190 (H) 850 - 3,900 cells/uL   Absolute Monocytes 680 200 - 950 cells/uL   Eosinophils Absolute 330 15 - 500 cells/uL   Basophils Absolute 140 0 - 200 cells/uL   Neutrophils Relative % 46.6 %   Total Lymphocyte 41.9 %   Monocytes Relative 6.8 %   Eosinophils Relative 3.3 %   Basophils Relative 1.4 %      Assessment & Plan:   Problem List Items Addressed This Visit       Cardiovascular and Mediastinum   Essential hypertension - Primary     Continue taking lisinopril 40 mg daily      Relevant Medications   lisinopril (ZESTRIL) 40 MG tablet   Other Relevant Orders   CBC with Differential/Platelet   COMPLETE METABOLIC PANEL WITH GFR     Respiratory   Chronic obstructive pulmonary disease (HCC)    Patient stable.  Continue using albuterol and Symbicort.      Relevant Medications   albuterol (VENTOLIN HFA) 108 (90 Base) MCG/ACT inhaler     Digestive   Collagenous colitis    Currently taking budesonide 3 mg daily pantoprazole 40 mg daily.  Reports she is doing better.      Gastroesophageal reflux disease    Continue pantoprazole 40 mg daily.      Relevant Medications   pantoprazole (PROTONIX) 40 MG tablet     Other   Chronic anxiety    Patient  reports she is having more anxiety attacks.  Patient was previously on alprazolam years ago.  Will get this to her as needed.  Short period of time.       Relevant Medications   ALPRAZolam (XANAX) 0.5 MG tablet   citalopram (CELEXA) 40 MG tablet   Pure hypercholesterolemia    Currently taking rosuvastatin 5 mg daily.      Relevant Medications   lisinopril (ZESTRIL) 40 MG tablet   Other Relevant Orders   Lipid panel   Other Visit Diagnoses     Hypercalcemia       Getting labs.   Relevant Orders   PTH, intact and calcium   VITAMIN D 25 Hydroxy (Vit-D Deficiency, Fractures)   Screening for diabetes mellitus       Relevant Orders   COMPLETE METABOLIC PANEL WITH GFR   Hemoglobin A1c   Need for influenza vaccination       Relevant Orders   Flu Vaccine Trivalent High Dose (Fluad) (Completed)   Need for pneumococcal vaccination       Relevant Orders   Pneumococcal conjugate vaccine 20-valent (Prevnar 20) (Completed)   Lumbar radiculopathy       refill of tizanidine sent in   Relevant Medications   ALPRAZolam (XANAX) 0.5 MG tablet   tiZANidine (ZANAFLEX) 4 MG tablet   citalopram (CELEXA) 40 MG tablet   COPD with asthma (HCC)       Relevant Medications    albuterol (VENTOLIN HFA) 108 (90 Base) MCG/ACT inhaler   Bilateral hip pain       Start meloxicam.  If no improvement may need to send to physical therapy.   Relevant Medications   meloxicam (MOBIC) 7.5 MG tablet   Pain in both wrists       Start meloxicam.  No improvement may need to send to physical therapy.   Relevant Medications   meloxicam (MOBIC) 7.5 MG tablet         Follow up plan: Return in about 6 months (around 05/17/2023) for follow up.

## 2022-11-17 NOTE — Assessment & Plan Note (Signed)
Currently taking budesonide 3 mg daily pantoprazole 40 mg daily.  Reports she is doing better.

## 2022-11-17 NOTE — Assessment & Plan Note (Signed)
Continue pantoprazole 40 mg daily.  ?

## 2022-11-17 NOTE — Assessment & Plan Note (Signed)
Patient stable.  Continue using albuterol and Symbicort.

## 2022-11-17 NOTE — Assessment & Plan Note (Signed)
Currently taking rosuvastatin 5 mg daily.

## 2022-11-17 NOTE — Assessment & Plan Note (Signed)
Continue taking lisinopril 40 mg daily. 

## 2022-11-18 LAB — CBC WITH DIFFERENTIAL/PLATELET
Absolute Lymphocytes: 2787 {cells}/uL (ref 850–3900)
Absolute Monocytes: 501 {cells}/uL (ref 200–950)
Basophils Absolute: 92 {cells}/uL (ref 0–200)
Basophils Relative: 1.2 %
Eosinophils Absolute: 377 {cells}/uL (ref 15–500)
Eosinophils Relative: 4.9 %
HCT: 49 % — ABNORMAL HIGH (ref 35.0–45.0)
Hemoglobin: 16.4 g/dL — ABNORMAL HIGH (ref 11.7–15.5)
MCH: 31.4 pg (ref 27.0–33.0)
MCHC: 33.5 g/dL (ref 32.0–36.0)
MCV: 93.9 fL (ref 80.0–100.0)
MPV: 11.9 fL (ref 7.5–12.5)
Monocytes Relative: 6.5 %
Neutro Abs: 3942 {cells}/uL (ref 1500–7800)
Neutrophils Relative %: 51.2 %
Platelets: 235 10*3/uL (ref 140–400)
RBC: 5.22 10*6/uL — ABNORMAL HIGH (ref 3.80–5.10)
RDW: 12.8 % (ref 11.0–15.0)
Total Lymphocyte: 36.2 %
WBC: 7.7 10*3/uL (ref 3.8–10.8)

## 2022-11-18 LAB — COMPLETE METABOLIC PANEL WITH GFR
AG Ratio: 2 (calc) (ref 1.0–2.5)
ALT: 14 U/L (ref 6–29)
AST: 12 U/L (ref 10–35)
Albumin: 4.4 g/dL (ref 3.6–5.1)
Alkaline phosphatase (APISO): 77 U/L (ref 37–153)
BUN: 9 mg/dL (ref 7–25)
CO2: 28 mmol/L (ref 20–32)
Calcium: 10.2 mg/dL (ref 8.6–10.4)
Chloride: 110 mmol/L (ref 98–110)
Creat: 0.66 mg/dL (ref 0.50–1.05)
Globulin: 2.2 g/dL (ref 1.9–3.7)
Glucose, Bld: 73 mg/dL (ref 65–99)
Potassium: 4.2 mmol/L (ref 3.5–5.3)
Sodium: 145 mmol/L (ref 135–146)
Total Bilirubin: 0.5 mg/dL (ref 0.2–1.2)
Total Protein: 6.6 g/dL (ref 6.1–8.1)
eGFR: 96 mL/min/{1.73_m2} (ref >=60)

## 2022-11-18 LAB — LIPID PANEL
Cholesterol: 137 mg/dL (ref <200)
HDL: 54 mg/dL (ref >=50)
LDL Cholesterol (Calc): 61 mg/dL
Non-HDL Cholesterol (Calc): 83 mg/dL (ref <130)
Total CHOL/HDL Ratio: 2.5 (calc) (ref <5.0)
Triglycerides: 141 mg/dL (ref <150)

## 2022-11-18 LAB — HEMOGLOBIN A1C
Hgb A1c MFr Bld: 5.9 %{Hb} — ABNORMAL HIGH (ref <5.7)
Mean Plasma Glucose: 123 mg/dL
eAG (mmol/L): 6.8 mmol/L

## 2022-11-18 LAB — VITAMIN D 25 HYDROXY (VIT D DEFICIENCY, FRACTURES): Vit D, 25-Hydroxy: 56 ng/mL (ref 30–100)

## 2022-11-18 LAB — PTH, INTACT AND CALCIUM
Calcium: 10.2 mg/dL (ref 8.6–10.4)
PTH: 71 pg/mL (ref 16–77)

## 2022-12-18 NOTE — Progress Notes (Signed)
Celso Amy, PA-C [address]  [address]  [place], [place] [address]  Main: [phone]  Fax: [phone]   Primary Care Physician: Berniece Salines, FNP  Primary Gastroenterologist:  Celso Amy, PA-C / Dr. Lannette Donath    CC:  F/U EPI and Collagenous Colitis  HPI: Leslie Evans is a 66 y.o. female returns for 49-month follow-up of EPI and collagenous colitis.  She last saw Dr. Allegra Lai 06/2022.  Has been treated with budesonide taper and Zenpep 40,000 lipase units.  History of GERD and takes Protonix 40 Mg daily.  Had hemorrhoid banding in 2022.  Current Symptoms: Famotidine-47 lipase units 2 with each meal and 1 with snacks.  Also currently taking budesonide 3 Mg 1 capsule daily.  On this treatment her diarrhea is under control.  She is having 1 formed bowel movement daily with no recent diarrhea.  She struggles with anorexia, chronic nausea, and weight loss.  Only eats 2 meals per day.  Does not have much appetite.  Takes Zofran as needed nausea and Reglan 10 Mg twice daily.  Admits to marijuana use.  01/2020 EGD: Erosive gastropathy, small hiatal hernia, otherwise normal.  Biopsies negative for celiac and H. pylori.  No dysplasia.  01/2020 Colonoscopy: Hemorrhoids, 3 mm hyperplastic polyp removed from descending colon, granularity and ascending colon and cecum.  Biopsies positive for collagenous colitis.  No dysplasia.  10-year repeat.  Current Outpatient Medications  Medication Sig Dispense Refill   acetaminophen (TYLENOL) 500 MG tablet Take 500 mg by mouth every 6 (six) hours as needed.     albuterol (VENTOLIN HFA) 108 (90 Base) MCG/ACT inhaler Inhale 1-2 puffs into the lungs every 4 (four) hours as needed for wheezing or shortness of breath. 18 g 5   ALPRAZolam (XANAX) 0.5 MG tablet Take 1 tablet (0.5 mg total) by mouth 3 (three) times daily as needed for anxiety. 90 tablet 1   budesonide (ENTOCORT EC) 3 MG 24 hr capsule Take 2 capsules (6 mg total) by mouth daily. 180  capsule 1   budesonide-formoterol (SYMBICORT) 160-4.5 MCG/ACT inhaler Inhale 2 puffs into the lungs 2 (two) times daily. 10.2 g 3   cholecalciferol (VITAMIN D3) 25 MCG (1000 UNIT) tablet Take 1,000 Units by mouth daily.     citalopram (CELEXA) 40 MG tablet Take 1 tablet (40 mg total) by mouth daily. 90 tablet 3   dicyclomine (BENTYL) 20 MG tablet Take 1 tablet (20 mg total) by mouth 3 (three) times daily as needed for spasms (abd cramping). 20 tablet 0   hydrOXYzine (VISTARIL) 25 MG capsule TAKE 1 TO 2 CAPSULES BY MOUTH TWICE DAILY AS NEEDED FOR INSOMNIA FOR ANXIETY 120 capsule 1   lisinopril (ZESTRIL) 40 MG tablet Take 1 tablet (40 mg total) by mouth daily. 90 tablet 3   meloxicam (MOBIC) 7.5 MG tablet Take 1 tablet (7.5 mg total) by mouth daily. 90 tablet 1   metoCLOPramide (REGLAN) 10 MG tablet Take 1 tablet (10 mg total) by mouth every 6 (six) hours as needed for nausea or vomiting. 30 tablet 0   ondansetron (ZOFRAN ODT) 4 MG disintegrating tablet Dissolve 1-2 tablets (4-8 mg total) by mouth once every 8 (eight) hours as needed for nausea or vomiting. 30 tablet 0   Pancrelipase, Lip-Prot-Amyl, (ZENPEP) 40000-126000 units CPEP Take 2 capsules with the first bite of each meal and 1 capsule with the first bite of each snack 240 capsule 5   pantoprazole (PROTONIX) 40 MG tablet Take 1 tablet (  40 mg total) by mouth daily. 90 tablet 3   rosuvastatin (CRESTOR) 5 MG tablet Take 1 tablet by mouth once daily 90 tablet 0   sucralfate (CARAFATE) 1 g tablet Take 1 tablet (1 g total) by mouth 3 (three) times daily with meals as needed for GERD/reflux/epigastric pain when eating or drinking. 30 tablet 1   tiZANidine (ZANAFLEX) 4 MG tablet Take 0.5-1 tablets (2-4 mg total) by mouth every 8 (eight) hours as needed for muscle spasms (muscle tightness). 90 tablet 1   No current facility-administered medications for this visit.    Allergies as of 12/19/2022 - Review Complete 11/17/2022  Allergen Reaction Noted    Lamisil [terbinafine] Anaphylaxis 11/27/2014   Pregabalin Other (See Comments) 08/21/2014   Tetracycline Other (See Comments) 08/21/2014   Gabapentin Dermatitis 08/21/2014   Ibuprofen Nausea And Vomiting 10/09/2015    Past Medical History:  Diagnosis Date   Anxiety    Asthma    Cervicalgia    COPD (chronic obstructive pulmonary disease) (HCC)    COVID-19    H/O: hysterectomy 2004   Hyperlipidemia    Hypertension     Past Surgical History:  Procedure Laterality Date   ABDOMINAL HYSTERECTOMY     COLONOSCOPY WITH PROPOFOL N/A 01/31/2020   Procedure: COLONOSCOPY WITH PROPOFOL;  Surgeon: Toney Reil, MD;  Location: [facility] ENDOSCOPY;  Service: Gastroenterology;  Laterality: N/A;   ESOPHAGOGASTRODUODENOSCOPY N/A 01/31/2020   Procedure: ESOPHAGOGASTRODUODENOSCOPY (EGD);  Surgeon: Toney Reil, MD;  Location: [facility] ENDOSCOPY;  Service: Gastroenterology;  Laterality: N/A;   LAPAROTOMY  05/02/2022   Procedure: EXPLORATORY LAPAROTOMY;  Surgeon: Carolan Shiver, MD;  Location: [facility] ORS;  Service: General;;  with reduction of internal hernia   LYSIS OF ADHESION N/A 05/02/2022   Procedure: LYSIS OF ADHESION;  Surgeon: Carolan Shiver, MD;  Location: [facility] ORS;  Service: General;  Laterality: N/A;   TUBAL LIGATION      Review of Systems:    All systems reviewed and negative except where noted in HPI.   Physical Examination:   BP (!) 140/90   Temp 97.8 F (36.6 C)   Ht 5\' 6"  (1.676 m)   Wt 109 lb (49.4 kg)   BMI 17.59 kg/m   General: Well-nourished, very thin female, in no acute distress.  Lungs: Clear to auscultation bilaterally. Non-labored. Heart: Regular rate and rhythm, no murmurs rubs or gallops.  Abdomen: Bowel sounds are normal; Abdomen is Soft; No hepatosplenomegaly, masses or hernias;  No Abdominal Tenderness; No guarding or rebound tenderness. Neuro: Alert and oriented x 3.  Grossly intact.  Psych: Alert and cooperative, anxious, hyper mood and  affect.   Imaging Studies: No results found.  Assessment and Plan:   Leslie Evans is a 67 y.o. y/o female returns for follow-up of:  1.  Collagenous colitis  Continue low-dose budesonide 3 Mg 1 capsule once daily.  Patient education handout given and discussed.  Avoid NSAIDs.  2.  Exocrine pancreatic insufficiency  Continue Zenpep 40,000 lipase units 2 with each meal along with each snack.  3.  Chronic nausea  Advised her to avoid marijuana due to risk of hyperemesis cannabis syndrome.  Continue Reglan 10 Mg twice daily and Zofran as needed.  Continue pantoprazole 40 Mg daily for GERD.  4.  Anorexia  Encouraged her to eat 3 meals per day plus snacks.  Discussed strategies to increase calorie intake.  Recommend she drink at least 2 protein shakes daily.   Celso Amy, PA-C  Follow up in 6  months or sooner if worsening GI symptoms.

## 2022-12-19 ENCOUNTER — Ambulatory Visit (INDEPENDENT_AMBULATORY_CARE_PROVIDER_SITE_OTHER): Payer: Medicare Other | Admitting: Physician Assistant

## 2022-12-19 ENCOUNTER — Encounter: Payer: Self-pay | Admitting: Physician Assistant

## 2022-12-19 VITALS — BP 140/90 | Temp 97.8°F | Ht 66.0 in | Wt 109.0 lb

## 2022-12-19 DIAGNOSIS — K52831 Collagenous colitis: Secondary | ICD-10-CM

## 2022-12-19 DIAGNOSIS — F1290 Cannabis use, unspecified, uncomplicated: Secondary | ICD-10-CM

## 2022-12-19 DIAGNOSIS — F50019 Anorexia nervosa, restricting type, unspecified: Secondary | ICD-10-CM

## 2022-12-19 DIAGNOSIS — R110 Nausea: Secondary | ICD-10-CM | POA: Diagnosis not present

## 2022-12-19 DIAGNOSIS — K8681 Exocrine pancreatic insufficiency: Secondary | ICD-10-CM

## 2022-12-19 DIAGNOSIS — R630 Anorexia: Secondary | ICD-10-CM | POA: Diagnosis not present

## 2023-01-09 ENCOUNTER — Other Ambulatory Visit: Payer: Self-pay | Admitting: Nurse Practitioner

## 2023-01-09 ENCOUNTER — Telehealth: Payer: Self-pay | Admitting: Nurse Practitioner

## 2023-01-09 DIAGNOSIS — E7800 Pure hypercholesterolemia, unspecified: Secondary | ICD-10-CM

## 2023-01-09 NOTE — Telephone Encounter (Signed)
 Copied from CRM [id number]. Topic: General - Other >> Jan 09, 2023  1:32 PM Dondra Prader E wrote: Reason for CRM: Pt and wants to pick up all of her medications at once, just waiting on Rosuvastatin refill.

## 2023-03-04 ENCOUNTER — Other Ambulatory Visit: Payer: Self-pay | Admitting: Gastroenterology

## 2023-03-04 DIAGNOSIS — R110 Nausea: Secondary | ICD-10-CM

## 2023-05-18 ENCOUNTER — Ambulatory Visit: Payer: Self-pay | Admitting: Nurse Practitioner

## 2023-05-18 NOTE — Progress Notes (Deleted)
 There were no vitals taken for this visit.   Subjective:    Patient ID: Leslie Evans, female    DOB: [birth year], 68 y.o.   MRN: [id number]  HPI: Leslie Evans is a 68 y.o. female  No chief complaint on file.   Discussed the use of AI scribe software for clinical note transcription with the patient, who gave verbal consent to proceed.  History of Present Illness          11/17/2022    9:53 AM 09/02/2022    9:30 AM 08/28/2022   12:08 PM  Depression screen PHQ 2/9  Decreased Interest 0 0 2  Down, Depressed, Hopeless 1 0 2  PHQ - 2 Score 1 0 4  Altered sleeping   1  Tired, decreased energy 1  1  Change in appetite 1  1  Feeling bad or failure about yourself  1  0  Trouble concentrating 0  0  Moving slowly or fidgety/restless 0  0  Suicidal thoughts 0  0  PHQ-9 Score   7  Difficult doing work/chores Not difficult at all      Relevant past medical, surgical, family and social history reviewed and updated as indicated. Interim medical history since our last visit reviewed. Allergies and medications reviewed and updated.  Review of Systems  Per HPI unless specifically indicated above     Objective:      There were no vitals taken for this visit.  {Vitals History (Optional):23777} Wt Readings from Last 3 Encounters:  12/19/22 109 lb (49.4 kg)  11/17/22 113 lb 1.6 oz (51.3 kg)  09/02/22 115 lb 3.2 oz (52.3 kg)    Physical Exam Physical Exam    Results for orders placed or performed in visit on 11/17/22  CBC with Differential/Platelet   Collection Time: 11/17/22 10:30 AM  Result Value Ref Range   WBC 7.7 3.8 - 10.8 Thousand/uL   RBC 5.22 (H) 3.80 - 5.10 Million/uL   Hemoglobin 16.4 (H) 11.7 - 15.5 g/dL   HCT 30.8 (H) 65.7 - 84.6 %   MCV 93.9 80.0 - 100.0 fL   MCH 31.4 27.0 - 33.0 pg   MCHC 33.5 32.0 - 36.0 g/dL   RDW 96.2 95.2 - 84.1 %   Platelets 235 140 - 400 Thousand/uL   MPV 11.9 7.5 - 12.5 fL   Neutro Abs 3,942 1,500 - 7,800 cells/uL    Absolute Lymphocytes 2,787 850 - 3,900 cells/uL   Absolute Monocytes 501 200 - 950 cells/uL   Eosinophils Absolute 377 15 - 500 cells/uL   Basophils Absolute 92 0 - 200 cells/uL   Neutrophils Relative % 51.2 %   Total Lymphocyte 36.2 %   Monocytes Relative 6.5 %   Eosinophils Relative 4.9 %   Basophils Relative 1.2 %  COMPLETE METABOLIC PANEL WITH GFR   Collection Time: 11/17/22 10:30 AM  Result Value Ref Range   Glucose, Bld 73 65 - 99 mg/dL   BUN 9 7 - 25 mg/dL   Creat 3.24 4.01 - 0.27 mg/dL   eGFR 96 > OR = 60 OZ/DGU/4.40H4   BUN/Creatinine Ratio SEE NOTE: 6 - 22 (calc)   Sodium 145 135 - 146 mmol/L   Potassium 4.2 3.5 - 5.3 mmol/L   Chloride 110 98 - 110 mmol/L   CO2 28 20 - 32 mmol/L   Calcium  10.2 8.6 - 10.4 mg/dL   Total Protein 6.6 6.1 - 8.1 g/dL   Albumin 4.4 3.6 - 5.1 g/dL  Globulin 2.2 1.9 - 3.7 g/dL (calc)   AG Ratio 2.0 1.0 - 2.5 (calc)   Total Bilirubin 0.5 0.2 - 1.2 mg/dL   Alkaline phosphatase (APISO) 77 37 - 153 U/L   AST 12 10 - 35 U/L   ALT 14 6 - 29 U/L  Lipid panel   Collection Time: 11/17/22 10:30 AM  Result Value Ref Range   Cholesterol 137 <200 mg/dL   HDL 54 > OR = 50 mg/dL   Triglycerides 161 <096 mg/dL   LDL Cholesterol (Calc) 61 mg/dL (calc)   Total CHOL/HDL Ratio 2.5 <5.0 (calc)   Non-HDL Cholesterol (Calc) 83 <045 mg/dL (calc)  Hemoglobin W0J   Collection Time: 11/17/22 10:30 AM  Result Value Ref Range   Hgb A1c MFr Bld 5.9 (H) <5.7 % of total Hgb   Mean Plasma Glucose 123 mg/dL   eAG (mmol/L) 6.8 mmol/L  PTH, intact and calcium    Collection Time: 11/17/22 10:30 AM  Result Value Ref Range   PTH 71 16 - 77 pg/mL   Calcium  10.2 8.6 - 10.4 mg/dL  VITAMIN D  25 Hydroxy (Vit-D Deficiency, Fractures)   Collection Time: 11/17/22 10:30 AM  Result Value Ref Range   Vit D, 25-Hydroxy 56 30 - 100 ng/mL   {Labs (Optional):23779}       Assessment & Plan:   Problem List Items Addressed This Visit   None    Assessment and  Plan Assessment & Plan         Follow up plan: No follow-ups on file.

## 2023-05-20 ENCOUNTER — Ambulatory Visit
Admission: RE | Admit: 2023-05-20 | Discharge: 2023-05-20 | Disposition: A | Discharge status: Home / Self Care | Attending: Nurse Practitioner | Admitting: Nurse Practitioner

## 2023-05-20 ENCOUNTER — Ambulatory Visit: Admitting: Nurse Practitioner

## 2023-05-20 ENCOUNTER — Ambulatory Visit
Admission: RE | Admit: 2023-05-20 | Discharge: 2023-05-20 | Disposition: A | Discharge status: Home / Self Care | Source: Ambulatory Visit | Attending: Nurse Practitioner

## 2023-05-20 ENCOUNTER — Encounter: Payer: Self-pay | Admitting: Nurse Practitioner

## 2023-05-20 VITALS — BP 118/70 | HR 97 | Temp 98.0°F | Ht 66.0 in | Wt 112.7 lb

## 2023-05-20 DIAGNOSIS — M25531 Pain in right wrist: Secondary | ICD-10-CM

## 2023-05-20 DIAGNOSIS — Z1231 Encounter for screening mammogram for malignant neoplasm of breast: Secondary | ICD-10-CM

## 2023-05-20 DIAGNOSIS — M5442 Lumbago with sciatica, left side: Secondary | ICD-10-CM

## 2023-05-20 DIAGNOSIS — M542 Cervicalgia: Secondary | ICD-10-CM | POA: Insufficient documentation

## 2023-05-20 DIAGNOSIS — Z13820 Encounter for screening for osteoporosis: Secondary | ICD-10-CM

## 2023-05-20 DIAGNOSIS — E7800 Pure hypercholesterolemia, unspecified: Secondary | ICD-10-CM | POA: Diagnosis not present

## 2023-05-20 DIAGNOSIS — R112 Nausea with vomiting, unspecified: Secondary | ICD-10-CM

## 2023-05-20 DIAGNOSIS — I10 Essential (primary) hypertension: Secondary | ICD-10-CM | POA: Diagnosis not present

## 2023-05-20 DIAGNOSIS — G8929 Other chronic pain: Secondary | ICD-10-CM | POA: Diagnosis present

## 2023-05-20 DIAGNOSIS — F410 Panic disorder [episodic paroxysmal anxiety] without agoraphobia: Secondary | ICD-10-CM

## 2023-05-20 DIAGNOSIS — J4489 Other specified chronic obstructive pulmonary disease: Secondary | ICD-10-CM

## 2023-05-20 DIAGNOSIS — M25551 Pain in right hip: Secondary | ICD-10-CM

## 2023-05-20 DIAGNOSIS — M25512 Pain in left shoulder: Secondary | ICD-10-CM | POA: Insufficient documentation

## 2023-05-20 DIAGNOSIS — M25552 Pain in left hip: Secondary | ICD-10-CM

## 2023-05-20 DIAGNOSIS — M5116 Intervertebral disc disorders with radiculopathy, lumbar region: Secondary | ICD-10-CM

## 2023-05-20 DIAGNOSIS — R7303 Prediabetes: Secondary | ICD-10-CM

## 2023-05-20 DIAGNOSIS — F419 Anxiety disorder, unspecified: Secondary | ICD-10-CM

## 2023-05-20 DIAGNOSIS — J449 Chronic obstructive pulmonary disease, unspecified: Secondary | ICD-10-CM

## 2023-05-20 DIAGNOSIS — K52831 Collagenous colitis: Secondary | ICD-10-CM

## 2023-05-20 DIAGNOSIS — K219 Gastro-esophageal reflux disease without esophagitis: Secondary | ICD-10-CM

## 2023-05-20 MED ORDER — ROSUVASTATIN CALCIUM 5 MG PO TABS: 5.0000 mg | ORAL_TABLET | Freq: Every day | ORAL | 1 refills | Status: DC

## 2023-05-20 MED ORDER — ALBUTEROL SULFATE HFA 108 (90 BASE) MCG/ACT IN AERS: 1.0000 | INHALATION_SPRAY | RESPIRATORY_TRACT | 5 refills | Status: DC | PRN

## 2023-05-20 MED ORDER — BUDESONIDE-FORMOTEROL FUMARATE 160-4.5 MCG/ACT IN AERO: 2.0000 | INHALATION_SPRAY | Freq: Two times a day (BID) | RESPIRATORY_TRACT | 3 refills | Status: AC

## 2023-05-20 MED ORDER — DICYCLOMINE HCL 20 MG PO TABS: 20.0000 mg | ORAL_TABLET | Freq: Three times a day (TID) | ORAL | 0 refills | Status: DC | PRN

## 2023-05-20 MED ORDER — CITALOPRAM HYDROBROMIDE 40 MG PO TABS: 40.0000 mg | ORAL_TABLET | Freq: Every day | ORAL | 3 refills | Status: AC

## 2023-05-20 MED ORDER — METOCLOPRAMIDE HCL 10 MG PO TABS: 10.0000 mg | ORAL_TABLET | Freq: Four times a day (QID) | ORAL | 0 refills | Status: DC | PRN

## 2023-05-20 MED ORDER — LISINOPRIL 40 MG PO TABS: 40.0000 mg | ORAL_TABLET | Freq: Every day | ORAL | 3 refills | Status: DC

## 2023-05-20 MED ORDER — PANTOPRAZOLE SODIUM 40 MG PO TBEC: 40.0000 mg | DELAYED_RELEASE_TABLET | Freq: Every day | ORAL | 3 refills | Status: DC

## 2023-05-20 MED ORDER — PREDNISONE 10 MG (21) PO TBPK: ORAL_TABLET | ORAL | 0 refills | Status: DC

## 2023-05-20 MED ORDER — HYDROXYZINE PAMOATE 25 MG PO CAPS: ORAL_CAPSULE | ORAL | 1 refills | Status: DC

## 2023-05-20 MED ORDER — MELOXICAM 7.5 MG PO TABS: 7.5000 mg | ORAL_TABLET | Freq: Every day | ORAL | 1 refills | Status: DC
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
## 2023-05-21 ENCOUNTER — Ambulatory Visit: Payer: Self-pay | Admitting: Nurse Practitioner

## 2023-05-21 DIAGNOSIS — M810 Age-related osteoporosis without current pathological fracture: Secondary | ICD-10-CM

## 2023-05-21 LAB — CBC WITH DIFFERENTIAL/PLATELET
Absolute Lymphocytes: 2803 {cells}/uL (ref 850–3900)
Absolute Monocytes: 624 {cells}/uL (ref 200–950)
Basophils Absolute: 77 {cells}/uL (ref 0–200)
Basophils Relative: 0.8 %
Eosinophils Absolute: 240 {cells}/uL (ref 15–500)
Eosinophils Relative: 2.5 %
HCT: 44.8 % (ref 35.0–45.0)
Hemoglobin: 15.5 g/dL (ref 11.7–15.5)
MCH: 32.2 pg (ref 27.0–33.0)
MCHC: 34.6 g/dL (ref 32.0–36.0)
MCV: 92.9 fL (ref 80.0–100.0)
MPV: 12.7 fL — ABNORMAL HIGH (ref 7.5–12.5)
Monocytes Relative: 6.5 %
Neutro Abs: 5856 {cells}/uL (ref 1500–7800)
Neutrophils Relative %: 61 %
Platelets: 248 10*3/uL (ref 140–400)
RBC: 4.82 10*6/uL (ref 3.80–5.10)
RDW: 13 % (ref 11.0–15.0)
Total Lymphocyte: 29.2 %
WBC: 9.6 10*3/uL (ref 3.8–10.8)

## 2023-05-21 LAB — LIPID PANEL
Cholesterol: 148 mg/dL (ref <200)
HDL: 56 mg/dL (ref >=50)
LDL Cholesterol (Calc): 65 mg/dL
Non-HDL Cholesterol (Calc): 92 mg/dL (ref <130)
Total CHOL/HDL Ratio: 2.6 (calc) (ref <5.0)
Triglycerides: 202 mg/dL — ABNORMAL HIGH (ref <150)

## 2023-05-21 LAB — COMPREHENSIVE METABOLIC PANEL WITH GFR
AG Ratio: 2.3 (calc) (ref 1.0–2.5)
ALT: 20 U/L (ref 6–29)
AST: 14 U/L (ref 10–35)
Albumin: 4.2 g/dL (ref 3.6–5.1)
Alkaline phosphatase (APISO): 63 U/L (ref 37–153)
BUN: 17 mg/dL (ref 7–25)
CO2: 26 mmol/L (ref 20–32)
Calcium: 10.3 mg/dL (ref 8.6–10.4)
Chloride: 105 mmol/L (ref 98–110)
Creat: 0.68 mg/dL (ref 0.50–1.05)
Globulin: 1.8 g/dL — ABNORMAL LOW (ref 1.9–3.7)
Glucose, Bld: 80 mg/dL (ref 65–99)
Potassium: 4.4 mmol/L (ref 3.5–5.3)
Sodium: 140 mmol/L (ref 135–146)
Total Bilirubin: 0.5 mg/dL (ref 0.2–1.2)
Total Protein: 6 g/dL — ABNORMAL LOW (ref 6.1–8.1)
eGFR: 95 mL/min/{1.73_m2} (ref >=60)

## 2023-05-21 LAB — HEMOGLOBIN A1C
Hgb A1c MFr Bld: 5.4 % (ref <5.7)
Mean Plasma Glucose: 108 mg/dL
eAG (mmol/L): 6 mmol/L

## 2023-06-09 ENCOUNTER — Other Ambulatory Visit: Payer: Self-pay | Admitting: Gastroenterology

## 2023-08-06 ENCOUNTER — Other Ambulatory Visit: Payer: Self-pay | Admitting: Gastroenterology

## 2023-08-06 ENCOUNTER — Other Ambulatory Visit: Payer: Self-pay

## 2023-08-06 MED ORDER — ZENPEP 40000-126000 UNITS PO CPEP: ORAL_CAPSULE | ORAL | 5 refills | Status: AC

## 2023-08-25 ENCOUNTER — Ambulatory Visit: Payer: Self-pay

## 2023-08-25 NOTE — Telephone Encounter (Signed)
   FYI Only or Action Required?: FYI only for provider.  Patient was last seen in primary care on 05/20/2023 by Gareth Mliss FALCON, FNP.  Called Nurse Triage reporting Urinary Frequency.  Symptoms began 3 days ago.  Interventions attempted: Rest, hydration, or home remedies, Dietary changes, and Other: increase in water and cranberry juice.  Symptoms are: gradually worsening.  Triage Disposition: Go to ED Now (or PCP Triage), See HCP Within 4 Hours (Or PCP Triage)  Patient/caregiver understands and will follow disposition?: Unsure         UTI, fever 99-101, urgency, frequent urination and pain at end of stream of urine, pelvic pain- [phone]  Reason for Disposition  Side (flank) or lower back pain present  [1] MODERATE to SEVERE vomiting (e.g., 3 or more times/day) AND [2] age > 60 years  Answer Assessment - Initial Assessment Questions History of pancreatitis   1. SYMPTOM: What's the main symptom you're concerned about? (e.g., frequency, incontinence)     Burning, frequency,  2. ONSET: When did the  urinary symptoms  start?     3 days ago 3. PAIN: Is there any pain? If Yes, ask: How bad is it? (Scale: 1-10; mild, moderate, severe)     4-5   tylenol  helps a little 4. CAUSE: What do you think is causing the symptoms?     ----- 5. OTHER SYMPTOMS: Do you have any other symptoms? (e.g., blood in urine, fever, flank pain, pain with urination)     Fever 99.9, pain with urination, nausea  Answer Assessment - Initial Assessment Questions Patient is advised with her urinary symptoms and vomiting it is recommended that she goes to the Emergency Room Patient states she will try to find someone to take her when they get off work but right now she doesn't have a way and she doesn't drive This RN offered to call an ambulance for her but she declined because of questions/concerns about insurance paying for it Patient is advised that going to the ER is recommended and she  states she will try to find someone to take her       1. VOMITING SEVERITY: How many times have you vomited in the past 24 hours?      4-5 times 2. ONSET: When did the vomiting begin?      ---- 3. FLUIDS: What fluids or food have you vomited up today? Have you been able to keep any fluids down?     Water and cranberry juice 4. ABDOMEN PAIN: Are your having any abdomen pain? If Yes : How bad is it and what does it feel like? (e.g., crampy, dull, intermittent, constant)      ----- 5. DIARRHEA: Is there any diarrhea? If Yes, ask: How many times today?      ------ 6. CONTACTS: Is there anyone else in the family with the same symptoms?      ------ 7. CAUSE: What do you think is causing your vomiting?     Urinary symptoms and history of pancreatitis 8. HYDRATION STATUS: Any signs of dehydration? (e.g., dry mouth [not only dry lips], too weak to stand) When did you last urinate?     Patient unable to keep anything down 9. OTHER SYMPTOMS: Do you have any other symptoms? (e.g., fever, headache, vertigo, vomiting blood or coffee grounds, recent head injury)     Fever, urinary symptoms  Protocols used: Urinary Symptoms-A-AH, Vomiting-A-AH

## 2023-08-25 NOTE — Telephone Encounter (Signed)
 UTI, fever 99-101, urgency, frequent urination and pain at end of stream of urine, pelvic pain- [phone]   1st attempt, no answer, lvm

## 2023-08-25 NOTE — Telephone Encounter (Signed)
 Called [facility] to advise them of patient's symptoms and ED disposition

## 2023-08-27 ENCOUNTER — Ambulatory Visit
Admission: EM | Admit: 2023-08-27 | Discharge: 2023-08-27 | Disposition: A | Discharge status: Home / Self Care | Attending: Family Medicine | Admitting: Family Medicine

## 2023-08-27 ENCOUNTER — Encounter: Payer: Self-pay | Admitting: Emergency Medicine

## 2023-08-27 DIAGNOSIS — N390 Urinary tract infection, site not specified: Secondary | ICD-10-CM | POA: Diagnosis not present

## 2023-08-27 LAB — POCT URINE DIPSTICK
Glucose, UA: NEGATIVE mg/dL
Ketones, POC UA: NEGATIVE mg/dL
Nitrite, UA: POSITIVE — AB
Protein Ur, POC: >=300 mg/dL — AB
Spec Grav, UA: 1.02 (ref 1.010–1.025)
Urobilinogen, UA: 2 U/dL — AB
pH, UA: 6 (ref 5.0–8.0)

## 2023-08-27 MED ORDER — PHENAZOPYRIDINE HCL 200 MG PO TABS: 200.0000 mg | ORAL_TABLET | Freq: Three times a day (TID) | ORAL | 0 refills | Status: DC | PRN

## 2023-08-27 MED ORDER — CEPHALEXIN 500 MG PO CAPS: 500.0000 mg | ORAL_CAPSULE | Freq: Two times a day (BID) | ORAL | 0 refills | Status: DC

## 2023-08-27 MED ORDER — ONDANSETRON 4 MG PO TBDP: 4.0000 mg | ORAL_TABLET | Freq: Three times a day (TID) | ORAL | 0 refills | Status: DC | PRN

## 2023-08-27 NOTE — ED Triage Notes (Signed)
 Urinary urgency, lower abd pressure, and lower back pain, incontinence since Saturday.  States has hd a fever since Saturday.

## 2023-08-27 NOTE — ED Provider Notes (Signed)
 [facility]    CSN: [id number] Arrival date & time: 08/27/23  1220      History   Chief Complaint No chief complaint on file.   HPI Leslie Evans is a 68 y.o. female.   Patient presenting today with 5-day history of urinary urgency, frequency, lower abdominal pressure, low back aching, urinary incontinence, nausea, vomiting.  Denies hematuria, bowel changes, upper respiratory symptoms, new foods or medications.  States history of urinary tract infections that have felt the same.  So far not try anything over-the-counter for symptoms.    Past Medical History:  Diagnosis Date   Anxiety    Asthma    Cervicalgia    COPD (chronic obstructive pulmonary disease) (HCC)    COVID-19    H/O: hysterectomy 2004   Hyperlipidemia    Hypertension     Patient Active Problem List   Diagnosis Date Noted   Gastroesophageal reflux disease 11/17/2022   Pure hypercholesterolemia 05/16/2022   Internal hernia 05/02/2022   Collagenous colitis 02/17/2020   Loss of weight    Essential hypertension 11/27/2014   Lumbar disc disease with radiculopathy 08/21/2014   Chronic anxiety 08/21/2014   Chronic obstructive pulmonary disease (HCC) 07/21/2013   Current tobacco use 07/21/2013    Past Surgical History:  Procedure Laterality Date   ABDOMINAL HYSTERECTOMY     COLONOSCOPY WITH PROPOFOL  N/A 01/31/2020   Procedure: COLONOSCOPY WITH PROPOFOL ;  Surgeon: Unk Corinn Skiff, MD;  Location: [facility] ENDOSCOPY;  Service: Gastroenterology;  Laterality: N/A;   ESOPHAGOGASTRODUODENOSCOPY N/A 01/31/2020   Procedure: ESOPHAGOGASTRODUODENOSCOPY (EGD);  Surgeon: Unk Corinn Skiff, MD;  Location: [facility] ENDOSCOPY;  Service: Gastroenterology;  Laterality: N/A;   LAPAROTOMY  05/02/2022   Procedure: EXPLORATORY LAPAROTOMY;  Surgeon: Rodolph Romano, MD;  Location: [facility] ORS;  Service: General;;  with reduction of internal hernia   LYSIS OF ADHESION N/A 05/02/2022   Procedure: LYSIS OF ADHESION;   Surgeon: Rodolph Romano, MD;  Location: [facility] ORS;  Service: General;  Laterality: N/A;   TUBAL LIGATION      OB History   No obstetric history on file.      Home Medications    Prior to Admission medications   Medication Sig Start Date End Date Taking? Authorizing Provider  cephALEXin  (KEFLEX ) 500 MG capsule Take 1 capsule (500 mg total) by mouth 2 (two) times daily. 08/27/23  Yes Stuart Vernell Norris, PA-C  ondansetron  (ZOFRAN -ODT) 4 MG disintegrating tablet Take 1 tablet (4 mg total) by mouth every 8 (eight) hours as needed for nausea or vomiting. 08/27/23  Yes Stuart Vernell Norris, PA-C  phenazopyridine  (PYRIDIUM ) 200 MG tablet Take 1 tablet (200 mg total) by mouth 3 (three) times daily as needed for pain. 08/27/23  Yes Stuart Vernell Norris, PA-C  acetaminophen  (TYLENOL ) 500 MG tablet Take 500 mg by mouth every 6 (six) hours as needed.    [provider]  albuterol  (VENTOLIN  HFA) 108 (90 Base) MCG/ACT inhaler Inhale 1-2 puffs into the lungs every 4 (four) hours as needed for wheezing or shortness of breath. 05/20/23   Pender, Julie F, FNP  ALPRAZolam  (XANAX ) 0.5 MG tablet Take 1 tablet (0.5 mg total) by mouth 3 (three) times daily as needed for anxiety. 11/17/22   Gareth Mliss FALCON, FNP  budesonide  (ENTOCORT EC ) 3 MG 24 hr capsule Take 2 capsules (6 mg total) by mouth daily. 06/18/22   Unk Corinn Skiff, MD  budesonide -formoterol  (SYMBICORT ) 160-4.5 MCG/ACT inhaler Inhale 2 puffs into the lungs 2 (two) times daily. 05/20/23  Gareth Mliss FALCON, FNP  cholecalciferol (VITAMIN D3) 25 MCG (1000 UNIT) tablet Take 1,000 Units by mouth daily.    [provider]  citalopram  (CELEXA ) 40 MG tablet Take 1 tablet (40 mg total) by mouth daily. 05/20/23   Pender, Julie F, FNP  dicyclomine  (BENTYL ) 20 MG tablet Take 1 tablet (20 mg total) by mouth 3 (three) times daily as needed for spasms (abd cramping). 05/20/23   Pender, Julie F, FNP  hydrOXYzine  (VISTARIL ) 25 MG capsule TAKE 1  TO 2 CAPSULES BY MOUTH TWICE DAILY AS NEEDED FOR INSOMNIA FOR ANXIETY 05/20/23   Pender, Julie F, FNP  lisinopril  (ZESTRIL ) 40 MG tablet Take 1 tablet (40 mg total) by mouth daily. 05/20/23   Gareth Mliss FALCON, FNP  meloxicam  (MOBIC ) 7.5 MG tablet Take 1 tablet (7.5 mg total) by mouth daily. 05/20/23   Pender, Julie F, FNP  metoCLOPramide  (REGLAN ) 10 MG tablet Take 1 tablet (10 mg total) by mouth every 6 (six) hours as needed for nausea or vomiting. 05/20/23   Gareth Mliss FALCON, FNP  ondansetron  (ZOFRAN -ODT) 4 MG disintegrating tablet DISSOLVE 1 TO 2 TABLETS IN MOUTH EVERY 8 HOURS AS NEEDED FOR NAUSEA AND VOMITING 03/04/23   Unk Corinn Skiff, MD  Pancrelipase , Lip-Prot-Amyl, (ZENPEP ) 40000-126000 units CPEP Take 2 capsules with the first bite of each meal and 1 capsule with the first bite of each snack 08/06/23   Jinny Carmine, MD  pantoprazole  (PROTONIX ) 40 MG tablet Take 1 tablet (40 mg total) by mouth daily. 05/20/23   Pender, Julie F, FNP  rosuvastatin  (CRESTOR ) 5 MG tablet Take 1 tablet (5 mg total) by mouth daily. 05/20/23   Pender, Julie F, FNP  tiZANidine  (ZANAFLEX ) 4 MG tablet Take 0.5-1 tablets (2-4 mg total) by mouth every 8 (eight) hours as needed for muscle spasms (muscle tightness). 11/17/22   Gareth Mliss FALCON, FNP    Family History Family History  Problem Relation Age of Onset   Cancer Mother    Diabetes Mother    Heart disease Mother    Cancer Father    Drug abuse Brother    Diabetes Maternal Grandmother    COPD Maternal Grandfather    Cancer Brother     Social History Social History   Tobacco Use   Smoking status: Every Day    Current packs/day: 0.50    Types: Cigarettes   Smokeless tobacco: Never  Vaping Use   Vaping status: Former   Substances: CBD  Substance Use Topics   Alcohol use: Yes    Alcohol/week: 0.0 standard drinks of alcohol    Comment: occ   Drug use: No     Allergies   Lamisil  [terbinafine ], Pregabalin, Tetracycline, Gabapentin, and  Ibuprofen   Review of Systems Review of Systems Per HPI  Physical Exam Triage Vital Signs ED Triage Vitals  Encounter Vitals Group     BP 08/27/23 1229 (!) 146/81     Girls Systolic BP Percentile --      Girls Diastolic BP Percentile --      Boys Systolic BP Percentile --      Boys Diastolic BP Percentile --      Pulse Rate 08/27/23 1229 96     Resp 08/27/23 1229 18     Temp 08/27/23 1229 98.8 F (37.1 C)     Temp Source 08/27/23 1229 Oral     SpO2 08/27/23 1229 94 %     Weight --      Height --  Head Circumference --      Peak Flow --      Pain Score 08/27/23 1231 5     Pain Loc --      Pain Education --      Exclude from Growth Chart --    No data found.  Updated Vital Signs BP (!) 146/81 (BP Location: Left Arm)   Pulse 96   Temp 98.8 F (37.1 C) (Oral)   Resp 18   SpO2 94%   Visual Acuity Right Eye Distance:   Left Eye Distance:   Bilateral Distance:    Right Eye Near:   Left Eye Near:    Bilateral Near:     Physical Exam Vitals and nursing note reviewed.  Constitutional:      Appearance: Normal appearance. She is not ill-appearing.  HENT:     Head: Atraumatic.     Mouth/Throat:     Mouth: Mucous membranes are moist.  Eyes:     Extraocular Movements: Extraocular movements intact.     Conjunctiva/sclera: Conjunctivae normal.  Cardiovascular:     Rate and Rhythm: Normal rate.  Pulmonary:     Effort: Pulmonary effort is normal.     Breath sounds: Normal breath sounds.  Musculoskeletal:        General: Normal range of motion.     Cervical back: Normal range of motion and neck supple.  Skin:    General: Skin is warm and dry.  Neurological:     Mental Status: She is alert and oriented to person, place, and time.  Psychiatric:        Mood and Affect: Mood normal.        Thought Content: Thought content normal.        Judgment: Judgment normal.      UC Treatments / Results  Labs (all labs ordered are listed, but only abnormal results  are displayed) Labs Reviewed  POCT URINE DIPSTICK - Abnormal; Notable for the following components:      Result Value   Color, UA straw (*)    Clarity, UA cloudy (*)    Bilirubin, UA small (*)    Blood, UA moderate (*)    Protein Ur, POC >=300 (*)    Urobilinogen, UA 2.0 (*)    Nitrite, UA Positive (*)    Leukocytes, UA Large (3+) (*)    All other components within normal limits  URINE CULTURE    EKG   Radiology No results found.  Procedures Procedures (including critical care time)  Medications Ordered in UC Medications - No data to display  Initial Impression / Assessment and Plan / UC Course  I have reviewed the triage vital signs and the nursing notes.  Pertinent labs & imaging results that were available during my care of the patient were reviewed by me and considered in my medical decision making (see chart for details).     Vitals and exam reassuring today, urinalysis today with evidence of urinary tract infection.  Will treat with Keflex , Zofran , Pyridium  as needed and urine culture pending.  Adjust if needed.  Final Clinical Impressions(s) / UC Diagnoses   Final diagnoses:  Acute lower UTI   Discharge Instructions   None    ED Prescriptions     Medication Sig Dispense Auth. Provider   cephALEXin  (KEFLEX ) 500 MG capsule Take 1 capsule (500 mg total) by mouth 2 (two) times daily. 10 capsule Stuart Vernell Norris, PA-C   ondansetron  (ZOFRAN -ODT) 4 MG disintegrating tablet Take 1  tablet (4 mg total) by mouth every 8 (eight) hours as needed for nausea or vomiting. 20 tablet Stuart Vernell Norris, PA-C   phenazopyridine  (PYRIDIUM ) 200 MG tablet Take 1 tablet (200 mg total) by mouth 3 (three) times daily as needed for pain. 6 tablet Stuart Vernell Norris, [place]      PDMP not reviewed this encounter.   Stuart Vernell Norris, [place] 08/27/23 1311

## 2023-08-29 LAB — URINE CULTURE: Culture: >=100000 — AB

## 2023-08-31 ENCOUNTER — Ambulatory Visit (HOSPITAL_COMMUNITY): Payer: Self-pay

## 2023-09-10 ENCOUNTER — Ambulatory Visit: Payer: Medicare Other

## 2023-09-10 DIAGNOSIS — Z0000 Encounter for general adult medical examination without abnormal findings: Secondary | ICD-10-CM | POA: Diagnosis not present

## 2023-09-10 NOTE — Progress Notes (Signed)
 Subjective:   Leslie Evans is a 68 y.o. who presents for a Medicare Wellness preventive visit.  As a reminder, Annual Wellness Visits don't include a physical exam, and some assessments may be limited, especially if this visit is performed virtually. We may recommend an in-person follow-up visit with your provider if needed.  Visit Complete: Virtual I connected with  Leslie Evans on [birth year] by a audio enabled telemedicine application and verified that I am speaking with the correct person using two identifiers.  Patient Location: Home  Provider Location: Home Office  I discussed the limitations of evaluation and management by telemedicine. The patient expressed understanding and agreed to proceed.  Vital Signs: Because this visit was a virtual/telehealth visit, some criteria may be missing or patient reported. Any vitals not documented were not able to be obtained and vitals that have been documented are patient reported.  VideoDeclined- This patient declined Librarian, academic. Therefore the visit was completed with audio only.  Persons Participating in Visit: Patient.  AWV Questionnaire: No: Patient Medicare AWV questionnaire was not completed prior to this visit.  Cardiac Risk Factors include: advanced age (>53men, >30 women);dyslipidemia;hypertension;sedentary lifestyle;smoking/ tobacco exposure     Objective:    Today's Vitals   09/10/23 1055  PainSc: 0-No pain   There is no height or weight on file to calculate BMI.     09/10/2023   11:11 AM 08/28/2022   12:15 PM 05/02/2022    1:33 PM 05/02/2022    9:29 AM 01/31/2020    9:57 AM 04/08/2016   10:48 AM 10/09/2015    2:42 PM  Advanced Directives  Does Patient Have a Medical Advance Directive? No No No No No No  No   Would patient like information on creating a medical advance directive? No - Patient declined  No - Patient declined No - Patient declined   No - patient declined information       Data saved with a previous flowsheet row definition    Current Medications (verified) Outpatient Encounter Medications as of 09/10/2023  Medication Sig   acetaminophen  (TYLENOL ) 500 MG tablet Take 500 mg by mouth every 6 (six) hours as needed.   albuterol  (VENTOLIN  HFA) 108 (90 Base) MCG/ACT inhaler Inhale 1-2 puffs into the lungs every 4 (four) hours as needed for wheezing or shortness of breath.   ALPRAZolam  (XANAX ) 0.5 MG tablet Take 1 tablet (0.5 mg total) by mouth 3 (three) times daily as needed for anxiety.   budesonide  (ENTOCORT EC ) 3 MG 24 hr capsule Take 2 capsules (6 mg total) by mouth daily.   budesonide -formoterol  (SYMBICORT ) 160-4.5 MCG/ACT inhaler Inhale 2 puffs into the lungs 2 (two) times daily.   cholecalciferol (VITAMIN D3) 25 MCG (1000 UNIT) tablet Take 1,000 Units by mouth daily.   citalopram  (CELEXA ) 40 MG tablet Take 1 tablet (40 mg total) by mouth daily.   dicyclomine  (BENTYL ) 20 MG tablet Take 1 tablet (20 mg total) by mouth 3 (three) times daily as needed for spasms (abd cramping).   hydrOXYzine  (VISTARIL ) 25 MG capsule TAKE 1 TO 2 CAPSULES BY MOUTH TWICE DAILY AS NEEDED FOR INSOMNIA FOR ANXIETY   lisinopril  (ZESTRIL ) 40 MG tablet Take 1 tablet (40 mg total) by mouth daily.   meloxicam  (MOBIC ) 7.5 MG tablet Take 1 tablet (7.5 mg total) by mouth daily.   metoCLOPramide  (REGLAN ) 10 MG tablet Take 1 tablet (10 mg total) by mouth every 6 (six) hours as needed for nausea or vomiting.  ondansetron  (ZOFRAN -ODT) 4 MG disintegrating tablet DISSOLVE 1 TO 2 TABLETS IN MOUTH EVERY 8 HOURS AS NEEDED FOR NAUSEA AND VOMITING   ondansetron  (ZOFRAN -ODT) 4 MG disintegrating tablet Take 1 tablet (4 mg total) by mouth every 8 (eight) hours as needed for nausea or vomiting.   Pancrelipase , Lip-Prot-Amyl, (ZENPEP ) 40000-126000 units CPEP Take 2 capsules with the first bite of each meal and 1 capsule with the first bite of each snack   pantoprazole  (PROTONIX ) 40 MG tablet Take 1 tablet (40 mg  total) by mouth daily.   phenazopyridine  (PYRIDIUM ) 200 MG tablet Take 1 tablet (200 mg total) by mouth 3 (three) times daily as needed for pain.   rosuvastatin  (CRESTOR ) 5 MG tablet Take 1 tablet (5 mg total) by mouth daily.   tiZANidine  (ZANAFLEX ) 4 MG tablet Take 0.5-1 tablets (2-4 mg total) by mouth every 8 (eight) hours as needed for muscle spasms (muscle tightness).   cephALEXin  (KEFLEX ) 500 MG capsule Take 1 capsule (500 mg total) by mouth 2 (two) times daily. (Patient not taking: Reported on 09/10/2023)   No facility-administered encounter medications on file as of 09/10/2023.    Allergies (verified) Lamisil  [terbinafine ], Pregabalin, Tetracycline, Gabapentin, and Ibuprofen   History: Past Medical History:  Diagnosis Date   Anxiety    Asthma    Cervicalgia    COPD (chronic obstructive pulmonary disease) (HCC)    COVID-19    H/O: hysterectomy 2004   Hyperlipidemia    Hypertension    Past Surgical History:  Procedure Laterality Date   ABDOMINAL HYSTERECTOMY     COLONOSCOPY WITH PROPOFOL  N/A 01/31/2020   Procedure: COLONOSCOPY WITH PROPOFOL ;  Surgeon: Unk Corinn Skiff, MD;  Location: [facility] ENDOSCOPY;  Service: Gastroenterology;  Laterality: N/A;   ESOPHAGOGASTRODUODENOSCOPY N/A 01/31/2020   Procedure: ESOPHAGOGASTRODUODENOSCOPY (EGD);  Surgeon: Unk Corinn Skiff, MD;  Location[facility] ENDOSCOPY;  Service: Gastroenterology;  Laterality: N/A;   LAPAROTOMY  05/02/2022   Procedure: EXPLORATORY LAPAROTOMY;  Surgeon: Rodolph Romano, MD;  Location: [facility] ORS;  Service: General;;  with reduction of internal hernia   LYSIS OF ADHESION N/A 05/02/2022   Procedure: LYSIS OF ADHESION;  Surgeon: Rodolph Romano, MD;  Location: [facility] ORS;  Service: General;  Laterality: N/A;   TUBAL LIGATION     Family History  Problem Relation Age of Onset   Cancer Mother    Diabetes Mother    Heart disease Mother    Cancer Father    Drug abuse Brother    Diabetes Maternal Grandmother    COPD  Maternal Grandfather    Cancer Brother    Social History   Socioeconomic History   Marital status: Divorced    Spouse name: Not on file   Number of children: 3   Years of education: Not on file   Highest education level: Not on file  Occupational History   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 [place]   Tobacco Use   Smoking status: Every Day    Current packs/day: 0.50    Types: Cigarettes   Smokeless tobacco: Never  Vaping Use   Vaping status: Former   Substances: CBD  Substance and Sexual Activity   Alcohol use: Yes    Alcohol/week: 0.0 standard drinks of alcohol    Comment: occ   Drug use: No   Sexual activity: Not Currently  Other Topics Concern   Not on file  Social History Narrative   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[place],  Patient stated no insurance   Social  Drivers of Health   Financial Resource Strain: Medium Risk (09/10/2023)   Overall Financial Resource Strain (CARDIA)    Difficulty of Paying Living Expenses: Somewhat hard  Food Insecurity: No Food Insecurity (09/10/2023)   Hunger Vital Sign    Worried About Running Out of Food in the Last Year: Never true    Ran Out of Food in the Last Year: Never true  Transportation Needs: No Transportation Needs (09/10/2023)   PRAPARE - Administrator, Civil Service (Medical): No    Lack of Transportation (Non-Medical): No  Physical Activity: Sufficiently Active (09/10/2023)   Exercise Vital Sign    Days of Exercise per Week: 7 days    Minutes of Exercise per Session: 30 min  Stress: No Stress Concern Present (09/10/2023)   [employer] of Occupational Health - Occupational Stress Questionnaire    Feeling of Stress: Only a little  Social Connections: Socially Isolated (09/10/2023)   Social Connection and Isolation Panel    Frequency of Communication with Friends and Family: More than three times a week    Frequency of Social Gatherings with Friends and Family: Twice a week    Attends Religious Services:  Never    Database administrator or Organizations: No    Attends Banker Meetings: Never    Marital Status: Widowed    Tobacco Counseling Ready to quit: Not Answered Counseling given: Not Answered    Clinical Intake:  Pre-visit preparation completed: Yes  Pain : No/denies pain Pain Score: 0-No pain     BMI - recorded: 18.1 Nutritional Status: BMI <19  Underweight Nutritional Risks: None Diabetes: No  Lab Results  Component Value Date   HGBA1C 5.4 05/20/2023   HGBA1C 5.9 (H) 11/17/2022   HGBA1C 5.2 05/16/2022     How often do you need to have someone help you when you read instructions, pamphlets, or other written materials from your doctor or pharmacy?: 1 - Never  Interpreter Needed?: No  Information entered by :: JHONNIE DAS, LPN   Activities of Daily Living    09/10/2023   11:17 AM 11/17/2022    9:53 AM  In your present state of health, do you have any difficulty performing the following activities:  Hearing? 0 1  Vision? 0 0  Difficulty concentrating or making decisions? 0 0  Walking or climbing stairs? 0 0  Dressing or bathing? 0 0  Doing errands, shopping? 0 0  Preparing Food and eating ? N   Using the Toilet? N   In the past six months, have you accidently leaked urine? N   Do you have problems with loss of bowel control? N   Managing your Medications? N   Managing your Finances? N   Housekeeping or managing your Housekeeping? N     Patient Care Team: Gareth Mliss FALCON, FNP as PCP - General (Nurse Practitioner) Valora Mliss BRAVO, [facility] (Inactive) (Pharmacist) Delene Thersia PARAS as Social Worker Estelle Fallow (Optometry) I have updated your Care Teams any recent Medical Services you may have received from other providers in the past year.     Assessment:   This is a routine wellness examination for [place].  Hearing/Vision screen Hearing Screening - Comments:: No aids Vision Screening - Comments:: Wears glasses all day-  DR.RICHARDSON   Goals Addressed             This Visit's Progress    DIET - EAT MORE FRUITS AND VEGETABLES  Depression Screen     09/10/2023   11:02 AM 05/20/2023    2:29 PM 11/17/2022    9:53 AM 09/02/2022    9:30 AM 08/28/2022   12:08 PM 05/16/2022    2:49 PM 09/04/2021   11:05 AM  PHQ 2/9 Scores  PHQ - 2 Score 2 1 1  0 4 0 2  PHQ- 9 Score 2 5   7 1 3     Fall Risk     09/10/2023   11:16 AM 05/20/2023    2:12 PM 11/17/2022    9:53 AM 11/17/2022    9:52 AM 09/02/2022    9:29 AM  Fall Risk   Falls in the past year? 1 1 1  0 0  Number falls in past yr: 1 1 1  0 0  Injury with Fall? 0 0 0 0 0  Risk for fall due to : No Fall Risks  History of fall(s) No Fall Risks   Follow up Falls evaluation completed;Falls prevention discussed Falls evaluation completed  Falls prevention discussed     MEDICARE RISK AT HOME:  Medicare Risk at Home Any stairs in or around the home?: Yes If so, are there any without handrails?: Yes Home free of loose throw rugs in walkways, pet beds, electrical cords, etc?: Yes Adequate lighting in your home to reduce risk of falls?: Yes Life alert?: No Use of a cane, walker or w/c?: No Grab bars in the bathroom?: No Shower chair or bench in shower?: No Elevated toilet seat or a handicapped toilet?: No  TIMED UP AND GO:  Was the test performed?  No  Cognitive Function: 6CIT completed        09/10/2023   11:19 AM 08/28/2022   12:20 PM  6CIT Screen  What Year? 0 points 0 points  What month? 0 points 0 points  What time? 0 points 0 points  Count back from 20 0 points 0 points  Months in reverse 0 points 0 points  Repeat phrase 0 points 0 points  Total Score 0 points 0 points    Immunizations Immunization History  Administered Date(s) Administered   Fluad Trivalent(High Dose 65+) 11/17/2022   Influenza,inj,Quad PF,6+ Mos 08/21/2014, 10/09/2015, 03/10/2017, 09/07/2018   PFIZER(Purple Top)SARS-COV-2 Vaccination 12/07/2019, 02/03/2020    PNEUMOCOCCAL CONJUGATE-20 11/17/2022   Td 04/06/2020    Screening Tests Health Maintenance  Topic Date Due   Zoster Vaccines- Shingrix (1 of 2) Never done   MAMMOGRAM  09/07/2014   COVID-19 Vaccine (3 - Pfizer risk series) 03/02/2020   DEXA SCAN  11/20/2022   INFLUENZA VACCINE  08/07/2023   Medicare Annual Wellness (AWV)  09/09/2024   Colonoscopy  01/30/2030   DTaP/Tdap/Td (2 - Tdap) 04/07/2030   Pneumococcal Vaccine: 50+ Years  Completed   Hepatitis C Screening  Completed   HPV VACCINES  Aged Out   Meningococcal B Vaccine  Aged Out    Health Maintenance  Health Maintenance Due  Topic Date Due   Zoster Vaccines- Shingrix (1 of 2) Never done   MAMMOGRAM  09/07/2014   COVID-19 Vaccine (3 - Pfizer risk series) 03/02/2020   DEXA SCAN  11/20/2022   INFLUENZA VACCINE  08/07/2023   Health Maintenance Items Addressed: UP TO DATE ON TDAP, PNA, SHINGRIX; HAS ORDER FOR MAMMOGRAM & BDS; UP TO DATE ON COLONOSCOPY  Additional Screening:  Vision Screening: Recommended annual ophthalmology exams for early detection of glaucoma and other disorders of the eye. Would you like a referral to an eye doctor? No  Dental Screening: Recommended annual dental exams for proper oral hygiene  Community Resource Referral / Chronic Care Management: CRR required this visit?  No   CCM required this visit?  No   Plan:    I have personally reviewed and noted the following in the patient's chart:   Medical and social history Use of alcohol, tobacco or illicit drugs  Current medications and supplements including opioid prescriptions. Patient is not currently taking opioid prescriptions. Functional ability and status Nutritional status Physical activity Advanced directives List of other physicians Hospitalizations, surgeries, and ER visits in previous 12 months Vitals Screenings to include cognitive, depression, and falls Referrals and appointments  In addition, I have reviewed and  discussed with patient certain preventive protocols, quality metrics, and best practice recommendations. A written personalized care plan for preventive services as well as general preventive health recommendations were provided to patient.   Jhonnie GORMAN Das, LPN   [id number]   After Visit Summary: (MyChart) Due to this being a telephonic visit, the after visit summary with patients personalized plan was offered to patient via MyChart   Notes: Nothing significant to report at this time.

## 2023-09-10 NOTE — Patient Instructions (Signed)
 Ms. Leslie Evans , Thank you for taking time out of your busy schedule to complete your Annual Wellness Visit with me. I enjoyed our conversation and look forward to speaking with you again next year. I, as well as your care team,  appreciate your ongoing commitment to your health goals. Please review the following plan we discussed and let me know if I can assist you in the future.  [facility]- [phone]  Follow up Visits: 09/15/24 @ 10:50 AM BY PHONE We will see or speak with you next year for your Next Medicare AWV with our clinical staff Have you seen your provider in the last 6 months (3 months if uncontrolled diabetes)? Yes  Clinician Recommendations:  Aim for 30 minutes of exercise or brisk walking, 6-8 glasses of water, and 5 servings of fruits and vegetables each day. TAKE CARE!      This is a list of the screenings recommended for you:  Health Maintenance  Topic Date Due   Zoster (Shingles) Vaccine (1 of 2) Never done   Mammogram  09/07/2014   COVID-19 Vaccine (3 - Pfizer risk series) 03/02/2020   DEXA scan (bone density measurement)  11/20/2022   Flu Shot  08/07/2023   Medicare Annual Wellness Visit  09/09/2024   Colon Cancer Screening  01/30/2030   DTaP/Tdap/Td vaccine (2 - Tdap) 04/07/2030   Pneumococcal Vaccine for age over 63  Completed   Hepatitis C Screening  Completed   HPV Vaccine  Aged Out   Meningitis B Vaccine  Aged Out    Advanced directives: (ACP Link)Information on Advanced Care Planning can be found at [employer] Advance Health Care Directives Advance Health Care Directives. [email or web address]  Advance Care Planning is important because it:  [x]  Makes sure you receive the medical care that is consistent with your values, goals, and preferences  [x]  It provides guidance to your family and loved ones and reduces their decisional burden about whether or not they are making the right decisions based on your wishes.  Follow the link  provided in your after visit summary or read over the paperwork we have mailed to you to help you started getting your Advance Directives in place. If you need assistance in completing these, please reach out to us  so that we can help you!

## 2023-10-08 ENCOUNTER — Ambulatory Visit
Admission: RE | Admit: 2023-10-08 | Discharge: 2023-10-08 | Disposition: A | Discharge status: Home / Self Care | Source: Ambulatory Visit | Attending: Nurse Practitioner | Admitting: Nurse Practitioner

## 2023-10-08 DIAGNOSIS — Z780 Asymptomatic menopausal state: Secondary | ICD-10-CM | POA: Insufficient documentation

## 2023-10-08 DIAGNOSIS — Z1231 Encounter for screening mammogram for malignant neoplasm of breast: Secondary | ICD-10-CM | POA: Diagnosis not present

## 2023-10-08 DIAGNOSIS — M810 Age-related osteoporosis without current pathological fracture: Secondary | ICD-10-CM | POA: Diagnosis not present

## 2023-10-08 DIAGNOSIS — Z13820 Encounter for screening for osteoporosis: Secondary | ICD-10-CM | POA: Insufficient documentation

## 2023-10-08 NOTE — Addendum Note (Signed)
 Addended by: Luigi Stuckey F on: 10/08/2023 11:56 AM   Modules accepted: Orders

## 2023-11-20 ENCOUNTER — Ambulatory Visit: Admitting: Nurse Practitioner

## 2023-11-20 ENCOUNTER — Other Ambulatory Visit: Payer: Self-pay | Admitting: Nurse Practitioner

## 2023-11-20 DIAGNOSIS — M25552 Pain in left hip: Secondary | ICD-10-CM

## 2023-11-20 DIAGNOSIS — M25531 Pain in right wrist: Secondary | ICD-10-CM

## 2023-11-23 ENCOUNTER — Ambulatory Visit: Admitting: Nurse Practitioner

## 2023-11-23 NOTE — Telephone Encounter (Signed)
 Requested Prescriptions  Pending Prescriptions Disp Refills   meloxicam  (MOBIC ) 7.5 MG tablet [Pharmacy Med Name: Meloxicam  7.5 MG Oral Tablet] 90 tablet 0    Sig: Take 1 tablet by mouth once daily     Analgesics:  COX2 Inhibitors Failed - 11/23/2023  9:27 AM      Failed - Manual Review: Labs are only required if the patient has taken medication for more than 8 weeks.      Passed - HGB in normal range and within 360 days    Hemoglobin  Date Value Ref Range Status  05/20/2023 15.5 11.7 - 15.5 g/dL Final  93/92/7977 83.1 (H) 11.1 - 15.9 g/dL Final         Passed - Cr in normal range and within 360 days    Creat  Date Value Ref Range Status  05/20/2023 0.68 0.50 - 1.05 mg/dL Final         Passed - HCT in normal range and within 360 days    HCT  Date Value Ref Range Status  05/20/2023 44.8 35.0 - 45.0 % Final   Hematocrit  Date Value Ref Range Status  06/12/2020 48.3 (H) 34.0 - 46.6 % Final         Passed - AST in normal range and within 360 days    AST  Date Value Ref Range Status  05/20/2023 14 10 - 35 U/L Final         Passed - ALT in normal range and within 360 days    ALT  Date Value Ref Range Status  05/20/2023 20 6 - 29 U/L Final         Passed - eGFR is 30 or above and within 360 days    GFR, Est African American  Date Value Ref Range Status  01/17/2020 96 > OR = 60 mL/min/1.19m2 Final   GFR, Est Non African American  Date Value Ref Range Status  01/17/2020 83 > OR = 60 mL/min/1.40m2 Final   GFR, Estimated  Date Value Ref Range Status  05/06/2022 >60 >60 mL/min Final    Comment:    (NOTE) Calculated using the CKD-EPI Creatinine Equation (2021)    eGFR  Date Value Ref Range Status  05/20/2023 95 > OR = 60 mL/min/1.16m2 Final  06/12/2020 98 >59 mL/min/1.73 Final         Passed - Patient is not pregnant      Passed - Valid encounter within last 12 months    Recent Outpatient Visits           6 months ago Essential hypertension   [facility]  [facility] Gareth Mliss [place], [place]

## 2023-11-27 NOTE — Progress Notes (Signed)
 Pharmacy Quality Measure Review  This patient is appearing on a report for being at risk of failing the adherence measure for cholesterol (statin) and hypertension (ACEi/ARB) medications this calendar year.   Medication: rosuvastatin  Last fill date: 11/20/23 for 90 day supply  Medication: lisinopril  Last fill date: 11/12/23 for 90 day supply  Insurance report was not up to date. No action needed at this time.   Jerrit Horen E. Marsh, PharmD, CPP Clinical Pharmacist [facility] [phone]

## 2023-12-09 ENCOUNTER — Telehealth: Payer: Self-pay

## 2023-12-09 ENCOUNTER — Ambulatory Visit (INDEPENDENT_AMBULATORY_CARE_PROVIDER_SITE_OTHER): Admitting: Nurse Practitioner

## 2023-12-09 ENCOUNTER — Encounter: Payer: Self-pay | Admitting: Nurse Practitioner

## 2023-12-09 VITALS — BP 136/88 | HR 45 | Temp 97.8°F | Ht 66.0 in | Wt 108.0 lb

## 2023-12-09 DIAGNOSIS — Z23 Encounter for immunization: Secondary | ICD-10-CM | POA: Diagnosis not present

## 2023-12-09 DIAGNOSIS — F419 Anxiety disorder, unspecified: Secondary | ICD-10-CM | POA: Diagnosis not present

## 2023-12-09 DIAGNOSIS — E7800 Pure hypercholesterolemia, unspecified: Secondary | ICD-10-CM | POA: Diagnosis not present

## 2023-12-09 DIAGNOSIS — J4489 Other specified chronic obstructive pulmonary disease: Secondary | ICD-10-CM

## 2023-12-09 DIAGNOSIS — I10 Essential (primary) hypertension: Secondary | ICD-10-CM | POA: Diagnosis not present

## 2023-12-09 DIAGNOSIS — Z720 Tobacco use: Secondary | ICD-10-CM

## 2023-12-09 DIAGNOSIS — M5116 Intervertebral disc disorders with radiculopathy, lumbar region: Secondary | ICD-10-CM | POA: Diagnosis not present

## 2023-12-09 DIAGNOSIS — K219 Gastro-esophageal reflux disease without esophagitis: Secondary | ICD-10-CM

## 2023-12-09 DIAGNOSIS — K5180 Other ulcerative colitis without complications: Secondary | ICD-10-CM

## 2023-12-09 DIAGNOSIS — K861 Other chronic pancreatitis: Secondary | ICD-10-CM

## 2023-12-09 DIAGNOSIS — R55 Syncope and collapse: Secondary | ICD-10-CM

## 2023-12-09 DIAGNOSIS — M810 Age-related osteoporosis without current pathological fracture: Secondary | ICD-10-CM | POA: Diagnosis not present

## 2023-12-09 DIAGNOSIS — J449 Chronic obstructive pulmonary disease, unspecified: Secondary | ICD-10-CM

## 2023-12-09 DIAGNOSIS — M1990 Unspecified osteoarthritis, unspecified site: Secondary | ICD-10-CM | POA: Diagnosis not present

## 2023-12-09 DIAGNOSIS — Z131 Encounter for screening for diabetes mellitus: Secondary | ICD-10-CM

## 2023-12-09 LAB — COMPREHENSIVE METABOLIC PANEL WITH GFR
AG Ratio: 2.1 (calc) (ref 1.0–2.5)
ALT: 15 U/L (ref 6–29)
AST: 14 U/L (ref 10–35)
Albumin: 4.6 g/dL (ref 3.6–5.1)
Alkaline phosphatase (APISO): 62 U/L (ref 37–153)
BUN: 11 mg/dL (ref 7–25)
CO2: 26 mmol/L (ref 20–32)
Calcium: 10.6 mg/dL — ABNORMAL HIGH (ref 8.6–10.4)
Chloride: 104 mmol/L (ref 98–110)
Creat: 0.7 mg/dL (ref 0.50–1.05)
Globulin: 2.2 g/dL (ref 1.9–3.7)
Glucose, Bld: 91 mg/dL (ref 65–99)
Potassium: 4.8 mmol/L (ref 3.5–5.3)
Sodium: 139 mmol/L (ref 135–146)
Total Bilirubin: 0.3 mg/dL (ref 0.2–1.2)
Total Protein: 6.8 g/dL (ref 6.1–8.1)
eGFR: 94 mL/min/1.73m2 (ref >=60)

## 2023-12-09 LAB — CBC WITH DIFFERENTIAL/PLATELET
Absolute Lymphocytes: 2709 {cells}/uL (ref 850–3900)
Absolute Monocytes: 824 {cells}/uL (ref 200–950)
Basophils Absolute: 113 {cells}/uL (ref 0–200)
Basophils Relative: 1.1 %
Eosinophils Absolute: 361 {cells}/uL (ref 15–500)
Eosinophils Relative: 3.5 %
HCT: 50.1 % — ABNORMAL HIGH (ref 35.9–46.0)
Hemoglobin: 17 g/dL — ABNORMAL HIGH (ref 11.7–15.5)
MCH: 31.1 pg (ref 27.0–33.0)
MCHC: 33.9 g/dL (ref 31.6–35.4)
MCV: 91.6 fL (ref 81.4–101.7)
MPV: 11.4 fL (ref 7.5–12.5)
Monocytes Relative: 8 %
Neutro Abs: 6293 {cells}/uL (ref 1500–7800)
Neutrophils Relative %: 61.1 %
Platelets: 317 Thousand/uL (ref 140–400)
RBC: 5.47 Million/uL — ABNORMAL HIGH (ref 3.80–5.10)
RDW: 13.1 % (ref 11.0–15.0)
Total Lymphocyte: 26.3 %
WBC: 10.3 Thousand/uL (ref 3.8–10.8)

## 2023-12-09 LAB — HEMOGLOBIN A1C
Hgb A1c MFr Bld: 5.2 % (ref <5.7)
Mean Plasma Glucose: 103 mg/dL
eAG (mmol/L): 5.7 mmol/L

## 2023-12-09 LAB — LIPID PANEL
Cholesterol: 127 mg/dL (ref <200)
HDL: 58 mg/dL (ref >=50)
LDL Cholesterol (Calc): 55 mg/dL
Non-HDL Cholesterol (Calc): 69 mg/dL (ref <130)
Total CHOL/HDL Ratio: 2.2 (calc) (ref <5.0)
Triglycerides: 61 mg/dL (ref <150)

## 2023-12-09 LAB — TSH: TSH: 1.81 m[IU]/L (ref 0.40–4.50)

## 2023-12-09 MED ORDER — MELOXICAM 15 MG PO TABS: 15.0000 mg | ORAL_TABLET | Freq: Every day | ORAL | 1 refills | Status: DC

## 2023-12-09 MED ORDER — NICOTINE 21 MG/24HR TD PT24: 21.0000 mg | MEDICATED_PATCH | Freq: Every day | TRANSDERMAL | 1 refills | Status: DC

## 2023-12-09 MED ORDER — ALBUTEROL SULFATE HFA 108 (90 BASE) MCG/ACT IN AERS: 1.0000 | INHALATION_SPRAY | RESPIRATORY_TRACT | 5 refills | Status: AC | PRN

## 2023-12-09 NOTE — Progress Notes (Signed)
 BP 136/88   Pulse (!) 45   Temp 97.8 F (36.6 C)   Ht 5' 6 (1.676 m)   Wt 108 lb (49 kg)   SpO2 93%   BMI 17.43 kg/m    Subjective:    Patient ID: Leslie Evans, female    DOB: [birth year], 68 y.o.   MRN: [id number]  HPI: Leslie Evans is a 68 y.o. female  Chief Complaint  Patient presents with   Medical Management of Chronic Issues    Pt states she passed out and fell last month. Would like medication for tobacco cessation.    Discussed the use of AI scribe software for clinical note transcription with the patient, who gave verbal consent to proceed.  History of Present Illness Leslie Evans is a 68 year old female who presents for a routine follow-up.  Syncope and associated trauma - On the night of November 14th, experienced sudden onset of feeling hot, sweaty, and a panic attack prior to losing consciousness - Awoke on the bathroom floor with a gash on the head, a cut on the chin, and injuries to the left knee and side - No recurrence of syncope since the initial episode - Currently sets an alarm every two hours at night to monitor herself - Family was unaware of the fall  Osteoporosis and bone health concerns - History of osteoporosis - Expresses concern about the severity of bone demineralization, feeling that her bones are 'completely porous' - taking vitamin d  and calcium  supplement -referral to endocrinology was placed   Anxiety and panic attacks - History of anxiety and panic attacks - Recent severe panic attack associated with syncope and fall - Manages symptoms with Xanax  0.5 mg three times daily as needed and hydroxyzine  25 mg one to two capsules twice daily as needed Celexa  40 mg daily  Fatigue  - Significant fatigue and lack of endurance - Difficulty maintaining weight  - Expresses frustration with persistent symptoms, stating she is 'sick and tired of being sick and tired' - Financial struggles impacting ability to afford necessities such as  heating oil  Respiratory symptoms and mucus production/COPD - Ongoing issues with mucus production - Initiated use of RespiClear, a natural supplement, which she feels is helping clear her lungs - Confirmed with pharmacist that RespiClear does not interfere with current medications  albuterol  inhaler as needed, Symbicort  twice daily,  Tobacco and marijuana use - Smokes approximately one pack of cigarettes per day - Desires to quit smoking and has made previous attempts, including use of nicotine gum - Occasionally smokes marijuana - Interested in tobacco cessation medication  Ulcerative colitis symptoms and management - History of ulcerative colitis - No gastroenterology appointment since last year - Perceives lack of improvement in symptoms despite medication - Frustrated with lack of follow-up from gastroenterology provider -taking Zenpep , pantoprazole  40 mg daily,   lumbar disc disease with radiculopathy/arthritis -taking meloxicam  7.5 mg daily,tizanidine  2-4 mg as needed for muscle spasms related to lumbar disc disease with radiculopathy  HTN Continue taking  lisinopril  40 mg daily, BP Readings from Last 3 Encounters:  12/09/23 136/88  08/27/23 (!) 146/81  05/20/23 118/70    Hld- continue rosuvastatin  5 mg daily Lipid Panel     Component Value Date/Time   CHOL 148 05/20/2023 1434   TRIG 202 (H) 05/20/2023 1434   HDL 56 05/20/2023 1434   CHOLHDL 2.6 05/20/2023 1434   VLDL 14 09/25/2015 1149   LDLCALC 65 05/20/2023 1434  12/09/2023   12:33 PM 09/10/2023   11:02 AM 05/20/2023    2:29 PM  Depression screen PHQ 2/9  Decreased Interest 1 1 0  Down, Depressed, Hopeless 1 1 1   PHQ - 2 Score 2 2 1   Altered sleeping 0 0 1  Tired, decreased energy 0 0 1  Change in appetite 0 0 2  Feeling bad or failure about yourself  0 0 0  Trouble concentrating 0 0 0  Moving slowly or fidgety/restless 0 0 0  Suicidal thoughts 0 0 0  PHQ-9 Score 2 2  5    Difficult doing  work/chores Not difficult at all Not difficult at all Not difficult at all     Data saved with a previous flowsheet row definition       12/09/2023   12:33 PM 05/20/2023    2:30 PM 11/17/2022    9:53 AM 05/16/2022    2:50 PM  GAD 7 : Generalized Anxiety Score  Nervous, Anxious, on Edge 1 2 1 1   Control/stop worrying 1 1 1  0  Worry too much - different things 1 1 1 1   Trouble relaxing 1 2 1 1   Restless 0 0 1 0  Easily annoyed or irritable 0 0 1 1  Afraid - awful might happen 1 1 1 1   Total GAD 7 Score 5 7 7 5   Anxiety Difficulty Not difficult at all  Somewhat difficult Not difficult at all     Relevant past medical, surgical, family and social history reviewed and updated as indicated. Interim medical history since our last visit reviewed. Allergies and medications reviewed and updated.  Review of Systems  Constitutional: Negative for fever or weight change.  Respiratory: Negative for cough and shortness of breath.   Cardiovascular: Negative for chest pain or palpitations.  Gastrointestinal: Negative for abdominal pain, no bowel changes.  Musculoskeletal: Negative for gait problem or joint swelling.  Skin: Negative for rash.  Neurological: Negative for dizziness or headache.  No other specific complaints in a complete review of systems (except as listed in HPI above).      Objective:      BP 136/88   Pulse (!) 45   Temp 97.8 F (36.6 C)   Ht 5' 6 (1.676 m)   Wt 108 lb (49 kg)   SpO2 93%   BMI 17.43 kg/m    Wt Readings from Last 3 Encounters:  12/09/23 108 lb (49 kg)  05/20/23 112 lb 11.2 oz (51.1 kg)  12/19/22 109 lb (49.4 kg)    Physical Exam GENERAL: Alert, cooperative, well developed, no acute distress HEENT: Normocephalic, normal oropharynx, moist mucous membranes CHEST: Clear to auscultation bilaterally, No wheezes, rhonchi, or crackles CARDIOVASCULAR: Normal heart rate and rhythm, S1 and S2 normal without murmurs ABDOMEN: Soft, non-tender, non-distended,  without organomegaly, Normal bowel sounds EXTREMITIES: No cyanosis or edema NEUROLOGICAL: Cranial nerves grossly intact, Moves all extremities without gross motor or sensory deficit  Results for orders placed or performed during the hospital encounter of 08/27/23  POCT URINE DIPSTICK   Collection Time: 08/27/23 12:46 PM  Result Value Ref Range   Color, UA straw (A) yellow   Clarity, UA cloudy (A) clear   Glucose, UA negative negative mg/dL   Bilirubin, UA small (A) negative   Ketones, POC UA negative negative mg/dL   Spec Grav, UA 8.979 8.989 - 1.025   Blood, UA moderate (A) negative   pH, UA 6.0 5.0 - 8.0   Protein Ur, POC >=300 (A)  negative mg/dL   Urobilinogen, UA 2.0 (A) 0.2 or 1.0 E.U./dL   Nitrite, UA Positive (A) Negative   Leukocytes, UA Large (3+) (A) Negative  Urine Culture   Collection Time: 08/27/23  1:06 PM   Specimen: Urine, Clean Catch  Result Value Ref Range   Specimen Description      URINE, CLEAN CATCH Performed at [facility], [address], [place], [place] [address]    Special Requests      NONE Performed at [facility], [address] [place], [place] [address]    Culture >=100,000 COLONIES/mL ESCHERICHIA COLI (A)    Report Status 08/29/2023 FINAL    Organism ID, Bacteria ESCHERICHIA COLI (A)       Susceptibility   Escherichia coli - MIC*    AMPICILLIN >=32 RESISTANT Resistant     CEFAZOLIN  (URINE) Value in next row Sensitive      4 SENSITIVEThis is a modified FDA-approved test that has been validated and its performance characteristics determined by the reporting laboratory.  This laboratory is certified under the Clinical Laboratory Improvement Amendments CLIA as qualified to perform high complexity clinical laboratory testing.    CEFEPIME Value in next row Sensitive      4 SENSITIVEThis is a modified FDA-approved test that has been validated and its performance characteristics determined by the reporting laboratory.  This laboratory is certified  under the Clinical Laboratory Improvement Amendments CLIA as qualified to perform high complexity clinical laboratory testing.    ERTAPENEM Value in next row Sensitive      4 SENSITIVEThis is a modified FDA-approved test that has been validated and its performance characteristics determined by the reporting laboratory.  This laboratory is certified under the Clinical Laboratory Improvement Amendments CLIA as qualified to perform high complexity clinical laboratory testing.    CEFTRIAXONE Value in next row Sensitive      4 SENSITIVEThis is a modified FDA-approved test that has been validated and its performance characteristics determined by the reporting laboratory.  This laboratory is certified under the Clinical Laboratory Improvement Amendments CLIA as qualified to perform high complexity clinical laboratory testing.    CIPROFLOXACIN Value in next row Sensitive      4 SENSITIVEThis is a modified FDA-approved test that has been validated and its performance characteristics determined by the reporting laboratory.  This laboratory is certified under the Clinical Laboratory Improvement Amendments CLIA as qualified to perform high complexity clinical laboratory testing.    GENTAMICIN Value in next row Sensitive      4 SENSITIVEThis is a modified FDA-approved test that has been validated and its performance characteristics determined by the reporting laboratory.  This laboratory is certified under the Clinical Laboratory Improvement Amendments CLIA as qualified to perform high complexity clinical laboratory testing.    NITROFURANTOIN Value in next row Sensitive      4 SENSITIVEThis is a modified FDA-approved test that has been validated and its performance characteristics determined by the reporting laboratory.  This laboratory is certified under the Clinical Laboratory Improvement Amendments CLIA as qualified to perform high complexity clinical laboratory testing.    TRIMETH/SULFA Value in next row Sensitive       4 SENSITIVEThis is a modified FDA-approved test that has been validated and its performance characteristics determined by the reporting laboratory.  This laboratory is certified under the Clinical Laboratory Improvement Amendments CLIA as qualified to perform high complexity clinical laboratory testing.    AMPICILLIN/SULBACTAM Value in next row Intermediate      4 SENSITIVEThis is  a modified FDA-approved test that has been validated and its performance characteristics determined by the reporting laboratory.  This laboratory is certified under the Clinical Laboratory Improvement Amendments CLIA as qualified to perform high complexity clinical laboratory testing.    PIP/TAZO Value in next row Sensitive ug/mL     <=4 SENSITIVEThis is a modified FDA-approved test that has been validated and its performance characteristics determined by the reporting laboratory.  This laboratory is certified under the Clinical Laboratory Improvement Amendments CLIA as qualified to perform high complexity clinical laboratory testing.    MEROPENEM Value in next row Sensitive      <=4 SENSITIVEThis is a modified FDA-approved test that has been validated and its performance characteristics determined by the reporting laboratory.  This laboratory is certified under the Clinical Laboratory Improvement Amendments CLIA as qualified to perform high complexity clinical laboratory testing.    * >=100,000 COLONIES/mL ESCHERICHIA COLI          Assessment & Plan:   Problem List Items Addressed This Visit       Cardiovascular and Mediastinum   Essential hypertension - Primary   Relevant Orders   CBC with Differential/Platelet   Comprehensive metabolic panel with GFR     Respiratory   Chronic obstructive pulmonary disease (HCC)   Relevant Medications   albuterol  (VENTOLIN  HFA) 108 (90 Base) MCG/ACT inhaler   nicotine (NICODERM CQ - DOSED IN MG/24 HOURS) 21 mg/24hr patch     Digestive   Gastroesophageal reflux  disease   Other ulcerative colitis without complication (HCC)   Chronic pancreatitis (HCC)     Nervous and Auditory   Lumbar disc disease with radiculopathy   Relevant Medications   meloxicam  (MOBIC ) 15 MG tablet   nicotine (NICODERM CQ - DOSED IN MG/24 HOURS) 21 mg/24hr patch     Musculoskeletal and Integument   Age-related osteoporosis without current pathological fracture   Arthritis   Relevant Medications   meloxicam  (MOBIC ) 15 MG tablet     Other   Chronic anxiety   Pure hypercholesterolemia   Relevant Orders   Lipid panel   Other Visit Diagnoses       COPD with asthma (HCC)       Relevant Medications   albuterol  (VENTOLIN  HFA) 108 (90 Base) MCG/ACT inhaler   nicotine (NICODERM CQ - DOSED IN MG/24 HOURS) 21 mg/24hr patch     Immunization due       Relevant Orders   Flu vaccine HIGH DOSE PF(Fluzone Trivalent) (Completed)     Screening for diabetes mellitus       Relevant Orders   Comprehensive metabolic panel with GFR   Hemoglobin A1c     Syncope, unspecified syncope type       Relevant Orders   CBC with Differential/Platelet   Comprehensive metabolic panel with GFR   TSH     Tobacco abuse       Relevant Medications   nicotine (NICODERM CQ - DOSED IN MG/24 HOURS) 21 mg/24hr patch        Assessment and Plan Assessment & Plan Syncope and fall- happened last month Recent syncope episode with fall resulting in multiple injuries, including a gash, cut, and bruising on the chin, and left knee pain. No recurrence of syncope since the event. Possible association with anxiety and panic attack. - Ordered lab work to investigate syncope episode  Osteoporosis Severe osteoporosis with significant bone density loss. Recent fall exacerbated concerns about bone fragility. Waiting for endocrinology appoitnment  Chronic obstructive pulmonary disease (  COPD) COPD managed with albuterol  inhaler as needed and Symbicort  twice daily. Reports improvement in mucus clearance with  RespiClear, a natural supplement. - Continue albuterol  inhaler as needed - Continue Symbicort  twice daily  Tobacco use disorder Current smoker with a desire to quit. Smokes a pack a day, sometimes more. Acknowledges the need to quit due to health concerns, including chronic pancreatitis. - Prescribed nicotine patches for smoking cessation  Lumbar radiculopathy due to intervertebral disc disorder Chronic lumbar radiculopathy with persistent pain. Current management includes tizanidine  as needed for muscle spasms. - Continue tizanidine  as needed for muscle spasms increase meloxicam   Osteoarthritis Chronic osteoarthritis with significant pain in hips and tailbone. Current management includes meloxicam , but she reports inadequate relief. - Increased meloxicam  dosage  Anxiety disorder Anxiety managed with Xanax  and hydroxyzine  as needed. Recent panic attack associated with syncope episode. She reports difficulty managing anxiety during acute episodes.  Essential hypertension Hypertension managed with lisinopril  40 mg daily. Blood pressure appears to be under control. - Continue lisinopril  40 mg daily  Gastroesophageal reflux disease (GERD) GERD managed with pantoprazole  40 mg daily. - Continue pantoprazole  40 mg daily  Chronic pancreatitis/UC Chronic pancreatitis with ongoing symptoms. She reports difficulty managing symptoms and expresses frustration with current management. - Continue follow-up with GI specialist        Follow up plan: Return in about 6 months (around 06/08/2024) for follow up.

## 2023-12-09 NOTE — Telephone Encounter (Signed)
 Copied from CRM #[id number]. Topic: General - Other >> Dec 09, 2023 10:51 AM Travis F wrote: Reason for CRM: Patient is calling in because she has an appointment at 55 and says she is on her way. She will be there in a few minutes.

## 2023-12-10 ENCOUNTER — Ambulatory Visit: Payer: Self-pay | Admitting: Nurse Practitioner

## 2023-12-11 NOTE — Progress Notes (Unsigned)
 12/15/2023 [name] [id number] [birth year]  Gastroenterology Office Note     Primary Care Physician:  Gareth Mliss FALCON, FNP  Primary GI Provider: Jinny Carmine, MD    Chief Complaint   Chief Complaint  Patient presents with   New Patient (Initial Visit)    Pancreatic insuff- malnourished- constant diarrhea- when she doesn't take budesimide- has to bouts of constipation-nausea/vomiting     History of Present Illness   Leslie Evans is a 68 y.o. female with PMHX of pancreatic insufficiency, GERD, and colitis presenting today for follow-up.  Patient last seen by Ellouise Console, PA on 12/19/2022 for 18-month follow-up of EPI and collagenous colitis. She last saw Dr. Unk 06/2022.  Has been treated with budesonide  taper and Zenpep  40,000 lipase units.  History of GERD and takes Protonix  40 Mg daily.  Had hemorrhoid banding in 2022.   Today, patient reports she continues to have nausea and vomiting.  She states this has been a chronic issue and no improvement since she was last seen 12/2022.  She reports nausea is every day, she will feel nauseous if she feels like she needs to eat something or will have nausea after she does have a meal.  He denies eating spicy foods.  She also has intermittent vomiting.  At times she can eat fine and then other times the smell of food will make her sick.  She has Reglan  and Zofran  to help with nausea.  States that she has not been able to gain weight, she feels weaker, and just feels like she is very malnourished.  He drinks boost several times a day. She is taking Zen Pap 2 capsules with meals and only with 1 other snack daily, not with every snack.  She initially stated having constant diarrhea but states this was historically for her and overall her diarrhea is controlled.  She continues to take budesonide , if she stops the budesonide  then she may have some diarrhea.  States she had 1 incidence of seeing bright red blood in the toilet when she was  straining during a bowel movement but this has not reoccurred. She reports rare NSAID and alcohol use.  She drinks a lot of water daily and about 4 cups of coffee. She continues to smoke a pack of cigarettes a day.   01/2020 EGD: Erosive gastropathy, small hiatal hernia, otherwise normal.  Biopsies negative for celiac and H. pylori.  No dysplasia.   01/2020 Colonoscopy: Hemorrhoids, 3 mm hyperplastic polyp removed from descending colon, granularity and ascending colon and cecum.  Biopsies positive for collagenous colitis.  No dysplasia.   - 10-year repeat.   Past Medical History:  Diagnosis Date   Anxiety    Asthma    Cervicalgia    COPD (chronic obstructive pulmonary disease) (HCC)    COVID-19    H/O: hysterectomy 2004   Hyperlipidemia    Hypertension     Past Surgical History:  Procedure Laterality Date   ABDOMINAL HYSTERECTOMY     COLONOSCOPY WITH PROPOFOL  N/A 01/31/2020   Procedure: COLONOSCOPY WITH PROPOFOL ;  Surgeon: Unk Corinn Skiff, MD;  Location: [facility] ENDOSCOPY;  Service: Gastroenterology;  Laterality: N/A;   ESOPHAGOGASTRODUODENOSCOPY N/A 01/31/2020   Procedure: ESOPHAGOGASTRODUODENOSCOPY (EGD);  Surgeon: Unk Corinn Skiff, MD;  Location: [facility] ENDOSCOPY;  Service: Gastroenterology;  Laterality: N/A;   LAPAROTOMY  05/02/2022   Procedure: EXPLORATORY LAPAROTOMY;  Surgeon: Rodolph Romano, MD;  Location: [facility] ORS;  Service: General;;  with reduction of internal hernia   LYSIS  OF ADHESION N/A 05/02/2022   Procedure: LYSIS OF ADHESION;  Surgeon: Rodolph Romano, MD;  Location: [facility] ORS;  Service: General;  Laterality: N/A;   TUBAL LIGATION      Current Outpatient Medications  Medication Sig Dispense Refill   acetaminophen  (TYLENOL ) 500 MG tablet Take 500 mg by mouth every 6 (six) hours as needed.     albuterol  (VENTOLIN  HFA) 108 (90 Base) MCG/ACT inhaler Inhale 1-2 puffs into the lungs every 4 (four) hours as needed for wheezing or shortness of breath. 18 g 5    ALPRAZolam  (XANAX ) 0.5 MG tablet Take 1 tablet (0.5 mg total) by mouth 3 (three) times daily as needed for anxiety. 90 tablet 1   budesonide  (ENTOCORT EC ) 3 MG 24 hr capsule Take 2 capsules (6 mg total) by mouth daily. 180 capsule 1   budesonide -formoterol  (SYMBICORT ) 160-4.5 MCG/ACT inhaler Inhale 2 puffs into the lungs 2 (two) times daily. 10.2 g 3   cholecalciferol (VITAMIN D3) 25 MCG (1000 UNIT) tablet Take 1,000 Units by mouth daily.     citalopram  (CELEXA ) 40 MG tablet Take 1 tablet (40 mg total) by mouth daily. 90 tablet 3   hydrOXYzine  (VISTARIL ) 25 MG capsule TAKE 1 TO 2 CAPSULES BY MOUTH TWICE DAILY AS NEEDED FOR INSOMNIA FOR ANXIETY 120 capsule 1   lisinopril  (ZESTRIL ) 40 MG tablet Take 1 tablet (40 mg total) by mouth daily. 90 tablet 3   meloxicam  (MOBIC ) 15 MG tablet Take 1 tablet (15 mg total) by mouth daily. 90 tablet 1   metoCLOPramide  (REGLAN ) 10 MG tablet Take 1 tablet (10 mg total) by mouth every 6 (six) hours as needed for nausea or vomiting. 30 tablet 0   nicotine  (NICODERM CQ  - DOSED IN MG/24 HOURS) 21 mg/24hr patch Place 1 patch (21 mg total) onto the skin daily. 28 patch 1   ondansetron  (ZOFRAN -ODT) 4 MG disintegrating tablet Take 1 tablet (4 mg total) by mouth every 8 (eight) hours as needed for nausea or vomiting. 20 tablet 0   Pancrelipase , Lip-Prot-Amyl, (ZENPEP ) 40000-126000 units CPEP Take 2 capsules with the first bite of each meal and 1 capsule with the first bite of each snack 240 capsule 5   pantoprazole  (PROTONIX ) 40 MG tablet Take 1 tablet (40 mg total) by mouth daily. 90 tablet 3   rosuvastatin  (CRESTOR ) 5 MG tablet Take 1 tablet (5 mg total) by mouth daily. 90 tablet 1   tiZANidine  (ZANAFLEX ) 4 MG tablet Take 0.5-1 tablets (2-4 mg total) by mouth every 8 (eight) hours as needed for muscle spasms (muscle tightness). 90 tablet 1   No current facility-administered medications for this visit.    Allergies as of 12/14/2023 - Review Complete 12/14/2023  Allergen  Reaction Noted   Lamisil  [terbinafine ] Anaphylaxis 11/27/2014   Pregabalin Other (See Comments) 08/21/2014   Tetracycline Other (See Comments) 08/21/2014   Gabapentin Dermatitis 08/21/2014   Ibuprofen Nausea And Vomiting 10/09/2015    Family History  Problem Relation Age of Onset   Breast cancer Mother    Cancer Mother    Diabetes Mother    Heart disease Mother    Cancer Father    Diabetes Maternal Grandmother    COPD Maternal Grandfather    Drug abuse Brother    Cancer Brother     Social History   Socioeconomic History   Marital status: Divorced    Spouse name: Not on file   Number of children: 3   Years of education: Not on file   Highest  education level: Not on file  Occupational History   Occupation: [employer]    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 [place]   Tobacco Use   Smoking status: Every Day    Current packs/day: 0.50    Types: Cigarettes   Smokeless tobacco: Never  Vaping Use   Vaping status: Former   Substances: CBD  Substance and Sexual Activity   Alcohol use: Yes    Alcohol/week: 0.0 standard drinks of alcohol    Comment: occ   Drug use: No   Sexual activity: Not Currently  Other Topics Concern   Not on file  Social History Narrative   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[place],  Patient stated no insurance   Social Drivers of Health   Financial Resource Strain: Medium Risk (09/10/2023)   Overall Financial Resource Strain (CARDIA)    Difficulty of Paying Living Expenses: Somewhat hard  Food Insecurity: No Food Insecurity (09/10/2023)   Hunger Vital Sign    Worried About Running Out of Food in the Last Year: Never true    Ran Out of Food in the Last Year: Never true  Transportation Needs: No Transportation Needs (09/10/2023)   PRAPARE - Administrator, Civil Service (Medical): No    Lack of Transportation (Non-Medical): No  Physical Activity: Sufficiently Active (09/10/2023)   Exercise Vital Sign    Days of Exercise per Week: 7 days    Minutes of Exercise  per Session: 30 min  Stress: No Stress Concern Present (09/10/2023)   [employer] of Occupational Health - Occupational Stress Questionnaire    Feeling of Stress: Only a little  Social Connections: Socially Isolated (09/10/2023)   Social Connection and Isolation Panel    Frequency of Communication with Friends and Family: More than three times a week    Frequency of Social Gatherings with Friends and Family: Twice a week    Attends Religious Services: Never    Database Administrator or Organizations: No    Attends Banker Meetings: Never    Marital Status: Widowed  Intimate Partner Violence: Not At Risk (09/10/2023)   Humiliation, Afraid, Rape, and Kick questionnaire    Fear of Current or Ex-Partner: No    Emotionally Abused: No    Physically Abused: No    Sexually Abused: No     RELEVANT GI HISTORY, IMAGING AND LABS: CBC    Component Value Date/Time   WBC 10.3 12/09/2023 1215   RBC 5.47 (H) 12/09/2023 1215   HGB 17.0 (H) 12/09/2023 1215   HGB 16.8 (H) 06/12/2020 1106   HCT 50.1 (H) 12/09/2023 1215   HCT 48.3 (H) 06/12/2020 1106   PLT 317 12/09/2023 1215   PLT 386 06/12/2020 1106   MCV 91.6 12/09/2023 1215   MCV 90 06/12/2020 1106   MCH 31.1 12/09/2023 1215   MCHC 33.9 12/09/2023 1215   RDW 13.1 12/09/2023 1215   RDW 13.9 06/12/2020 1106   LYMPHSABS 4,190 (H) 05/16/2022 1513   EOSABS 361 12/09/2023 1215   BASOSABS 113 12/09/2023 1215   Recent Labs    05/20/23 1434 12/09/23 1215  HGB 15.5 17.0*    CMP     Component Value Date/Time   NA 139 12/09/2023 1215   NA 135 06/12/2020 1106   K 4.8 12/09/2023 1215   CL 104 12/09/2023 1215   CO2 26 12/09/2023 1215   GLUCOSE 91 12/09/2023 1215   BUN 11 12/09/2023 1215   BUN 7 (L) 06/12/2020 1106   CREATININE 0.70 12/09/2023  1215   CALCIUM  10.6 (H) 12/09/2023 1215   PROT 6.8 12/09/2023 1215   PROT 7.2 06/12/2020 1106   ALBUMIN 3.8 05/02/2022 0955   ALBUMIN 4.9 (H) 06/12/2020 1106   AST 14 12/09/2023  1215   ALT 15 12/09/2023 1215   ALKPHOS 61 05/02/2022 0955   BILITOT 0.3 12/09/2023 1215   BILITOT 0.5 06/12/2020 1106   GFRNONAA >60 05/06/2022 0450   GFRNONAA 83 01/17/2020 1522   GFRAA 96 01/17/2020 1522      Latest Ref Rng & Units 12/09/2023   12:15 PM 05/20/2023    2:34 PM 11/17/2022   10:30 AM  Hepatic Function  Total Protein 6.1 - 8.1 g/dL 6.8  6.0  6.6   AST 10 - 35 U/L 14  14  12    ALT 6 - 29 U/L 15  20  14    Total Bilirubin 0.2 - 1.2 mg/dL 0.3  0.5  0.5       Review of Systems   All systems reviewed and negative except where noted in HPI.    Physical Exam  BP (!) 150/107   Pulse 60   Temp 98.6 F (37 C)   Ht 5' 6 (1.676 m)   Wt 107 lb (48.5 kg)   SpO2 97%   BMI 17.27 kg/m  No LMP recorded. Patient has had a hysterectomy. General:   Alert and oriented. Pleasant and cooperative.  Thin appearing.  No acute distress Head:  Normocephalic and atraumatic. Eyes:  Without icterus Ears:  Normal auditory acuity. Neck:  Supple; no masses or thyromegaly. Lungs:  Respirations even and unlabored.  Clear throughout to auscultation.   No wheezes, crackles, or rhonchi. No acute distress. Heart:  Regular rate and rhythm; no murmurs, clicks, rubs, or gallops. Abdomen:  Normal bowel sounds.  No bruits.  Soft, non-tender and non-distended without masses, hepatosplenomegaly or hernias noted.  No guarding or rebound tenderness.   Rectal:  Deferred. Msk:  Symmetrical without gross deformities. Normal posture. Extremities:  Without edema. Neurologic:  Alert and  oriented x4;  grossly normal neurologically. Skin:  Intact without significant lesions or rashes. Psych:  Alert and cooperative. Normal mood and affect.   Assessment & Plan   Tobey Lippard Olejniczak is a 68 y.o. female presenting today with chronic nausea and vomiting and pancreatic insufficiency.  Nausea and vomiting - Continue pantoprazole  40 mg once daily 30 minutes prior to meals - limit caffeine. Discussed smoking  cessation. - Refilled Zofran . - Schedule EGD in the near future. I discussed risks of EGD with patient today, including risk of sedation, bleeding or perforation. Patient provides understanding and gave verbal consent to proceed.  Pancreatic insufficiency - Will check vitamin levels and fecal elastase today - Make sure to take Zenpep  also with every snack.  May need to increase to 3 with meals.   Patient will follow-up next visit with Dr. Melany in [facility] following EGD.  Grayce Bohr, DNP, AGNP-C [facility] Gastroenterology

## 2023-12-14 ENCOUNTER — Ambulatory Visit (INDEPENDENT_AMBULATORY_CARE_PROVIDER_SITE_OTHER): Admitting: Family Medicine

## 2023-12-14 ENCOUNTER — Encounter: Payer: Self-pay | Admitting: Family Medicine

## 2023-12-14 VITALS — BP 150/107 | HR 60 | Temp 98.6°F | Ht 66.0 in | Wt 107.0 lb

## 2023-12-14 DIAGNOSIS — R112 Nausea with vomiting, unspecified: Secondary | ICD-10-CM | POA: Diagnosis not present

## 2023-12-14 DIAGNOSIS — K8689 Other specified diseases of pancreas: Secondary | ICD-10-CM | POA: Diagnosis not present

## 2024-01-25 ENCOUNTER — Encounter: Payer: Self-pay | Admitting: Gastroenterology

## 2024-01-25 ENCOUNTER — Telehealth: Payer: Self-pay

## 2024-01-25 ENCOUNTER — Other Ambulatory Visit: Payer: Self-pay

## 2024-01-25 DIAGNOSIS — E7800 Pure hypercholesterolemia, unspecified: Secondary | ICD-10-CM

## 2024-01-25 DIAGNOSIS — F419 Anxiety disorder, unspecified: Secondary | ICD-10-CM

## 2024-01-25 DIAGNOSIS — F410 Panic disorder [episodic paroxysmal anxiety] without agoraphobia: Secondary | ICD-10-CM

## 2024-01-25 DIAGNOSIS — R112 Nausea with vomiting, unspecified: Secondary | ICD-10-CM

## 2024-01-25 DIAGNOSIS — M5416 Radiculopathy, lumbar region: Secondary | ICD-10-CM

## 2024-01-25 MED ORDER — TIZANIDINE HCL 4 MG PO TABS: 2.0000 mg | ORAL_TABLET | Freq: Three times a day (TID) | ORAL | 1 refills | Status: AC | PRN

## 2024-01-25 MED ORDER — ONDANSETRON 4 MG PO TBDP: 4.0000 mg | ORAL_TABLET | Freq: Three times a day (TID) | ORAL | 0 refills | Status: AC | PRN

## 2024-01-25 MED ORDER — HYDROXYZINE PAMOATE 25 MG PO CAPS: ORAL_CAPSULE | ORAL | 1 refills | Status: AC

## 2024-01-25 MED ORDER — ROSUVASTATIN CALCIUM 5 MG PO TABS: 5.0000 mg | ORAL_TABLET | Freq: Every day | ORAL | 1 refills | Status: AC

## 2024-01-25 MED ORDER — METOCLOPRAMIDE HCL 10 MG PO TABS: 10.0000 mg | ORAL_TABLET | Freq: Four times a day (QID) | ORAL | 0 refills | Status: AC | PRN

## 2024-01-27 ENCOUNTER — Ambulatory Visit: Payer: Self-pay

## 2024-01-27 ENCOUNTER — Other Ambulatory Visit: Payer: Self-pay

## 2024-01-27 ENCOUNTER — Ambulatory Visit
Admission: RE | Admit: 2024-01-27 | Discharge: 2024-01-27 | Disposition: A | Discharge status: Home / Self Care | Attending: Gastroenterology | Admitting: Gastroenterology

## 2024-01-27 ENCOUNTER — Encounter: Payer: Self-pay | Admitting: Gastroenterology

## 2024-01-27 ENCOUNTER — Encounter
Admission: RE | Disposition: A | Discharge status: Home / Self Care | Payer: Self-pay | Source: Home / Self Care | Attending: Gastroenterology

## 2024-01-27 DIAGNOSIS — F17210 Nicotine dependence, cigarettes, uncomplicated: Secondary | ICD-10-CM | POA: Insufficient documentation

## 2024-01-27 DIAGNOSIS — F17290 Nicotine dependence, other tobacco product, uncomplicated: Secondary | ICD-10-CM | POA: Insufficient documentation

## 2024-01-27 DIAGNOSIS — Z79899 Other long term (current) drug therapy: Secondary | ICD-10-CM | POA: Insufficient documentation

## 2024-01-27 DIAGNOSIS — I10 Essential (primary) hypertension: Secondary | ICD-10-CM | POA: Insufficient documentation

## 2024-01-27 DIAGNOSIS — K8689 Other specified diseases of pancreas: Secondary | ICD-10-CM

## 2024-01-27 DIAGNOSIS — Z59868 Other specified financial insecurity: Secondary | ICD-10-CM | POA: Insufficient documentation

## 2024-01-27 DIAGNOSIS — Z604 Social exclusion and rejection: Secondary | ICD-10-CM | POA: Insufficient documentation

## 2024-01-27 DIAGNOSIS — R112 Nausea with vomiting, unspecified: Secondary | ICD-10-CM | POA: Diagnosis not present

## 2024-01-27 DIAGNOSIS — Z8719 Personal history of other diseases of the digestive system: Secondary | ICD-10-CM | POA: Diagnosis not present

## 2024-01-27 DIAGNOSIS — R1114 Bilious vomiting: Secondary | ICD-10-CM

## 2024-01-27 DIAGNOSIS — R1115 Cyclical vomiting syndrome unrelated to migraine: Secondary | ICD-10-CM | POA: Diagnosis present

## 2024-01-27 DIAGNOSIS — J4489 Other specified chronic obstructive pulmonary disease: Secondary | ICD-10-CM | POA: Insufficient documentation

## 2024-01-27 HISTORY — DX: Acute pancreatitis without necrosis or infection, unspecified: K85.90

## 2024-01-27 HISTORY — PX: ESOPHAGOGASTRODUODENOSCOPY: SHX5428

## 2024-01-27 HISTORY — DX: Gastro-esophageal reflux disease without esophagitis: K21.9

## 2024-01-27 HISTORY — DX: Myoneural disorder, unspecified: G70.9

## 2024-01-27 HISTORY — DX: Panic disorder (episodic paroxysmal anxiety): F41.0

## 2024-01-27 HISTORY — DX: Family history of other specified conditions: Z84.89

## 2024-01-27 HISTORY — DX: Unspecified osteoarthritis, unspecified site: M19.90

## 2024-01-27 HISTORY — DX: Depression, unspecified: F32.A

## 2024-01-27 MED ORDER — PROPOFOL 10 MG/ML IV BOLUS
INTRAVENOUS | Status: DC | PRN
  Administered 2024-01-27: 70 mg via INTRAVENOUS
  Administered 2024-01-27 (×2): 40 mg via INTRAVENOUS

## 2024-01-27 MED ORDER — LACTATED RINGERS IV SOLN: INTRAVENOUS | Status: DC

## 2024-01-27 MED ORDER — STERILE WATER FOR IRRIGATION IR SOLN
Status: DC | PRN
  Administered 2024-01-27: 1

## 2024-01-27 MED ORDER — SODIUM CHLORIDE 0.9 % IV SOLN: INTRAVENOUS | Status: DC

## 2024-01-27 MED ORDER — LIDOCAINE HCL (CARDIAC) PF 100 MG/5ML IV SOSY
PREFILLED_SYRINGE | INTRAVENOUS | Status: DC | PRN
  Administered 2024-01-27: 50 mg via INTRAVENOUS

## 2024-01-27 NOTE — Op Note (Signed)
 [facility] Gastroenterology Patient Name: Leslie Evans Procedure Date: 8/78/7973 11:02 AM MRN: [id number] Account #: [id number] Date of Birth: [birth year] Admit Type: Outpatient Age: 69 Room: [facility] OR ROOM 01 Gender: Female Note Status: Finalized Instrument Name: Endoscope [id number] Procedure:             Upper GI endoscopy Indications:           Persistent vomiting of unknown cause Providers:             [name], MD Referring MD:          Mliss FALCON. Pender (Referring MD) Medicines:             Propofol  per Anesthesia Complications:         No immediate complications. Procedure:             Pre-Anesthesia Assessment:                        - Prior to the procedure, a History and Physical was                         performed, and patient medications and allergies were                         reviewed. The patient's tolerance of previous                         anesthesia was also reviewed. The risks and benefits                         of the procedure and the sedation options and risks                         were discussed with the patient. All questions were                         answered, and informed consent was obtained. Prior                         Anticoagulants: The patient has taken no anticoagulant                         or antiplatelet agents. ASA Grade Assessment: III - A                         patient with severe systemic disease. After reviewing                         the risks and benefits, the patient was deemed in                         satisfactory condition to undergo the procedure.                        After obtaining informed consent, the endoscope was                         passed under direct vision. Throughout the procedure,  the patient's blood pressure, pulse, and oxygen                         saturations were monitored continuously. The Endoscope                         was introduced through the  mouth, and advanced to the                         third part of duodenum. The upper GI endoscopy was                         accomplished without difficulty. The patient tolerated                         the procedure well. Findings:      The examined esophagus was normal. Biopsies were taken with a cold       forceps for histology. Biopsies were taken with a cold forceps for       histology.      The entire examined stomach was normal. Biopsies were taken with a cold       forceps for histology.      The examined duodenum was normal. Biopsies for histology were taken with       a cold forceps for evaluation of celiac disease. Estimated blood loss:       none. Impression:            - Normal esophagus. Biopsied.                        - Normal stomach. Biopsied.                        - Normal examined duodenum. Biopsied. Recommendation:        - Patient has a contact number available for                         emergencies. The signs and symptoms of potential                         delayed complications were discussed with the patient.                         Return to normal activities tomorrow. Written                         discharge instructions were provided to the patient.                        - GERD prevention diet.                        - Continue present medications.                        - Await pathology results.                        - If negative, consider increasing protonix  to BID  dosing, and consider KUB to eval for fecal loading as                         a cause of nausea.                        - The findings and recommendations were discussed with                         the designated responsible adult. 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 All rights reserved. The codes documented in this report are preliminary and upon coder review may  be revised to meet current compliance requirements. [name], MD 01/27/2024 11:28:55 AM Number of Addenda: 0 Note Initiated On: 01/27/2024 11:02 AM Total Procedure Duration: 0 hours 4 minutes 46 seconds  Estimated Blood Loss:  Estimated blood loss: none.      [facility]

## 2024-01-27 NOTE — Transfer of Care (Signed)
 Immediate Anesthesia Transfer of Care Note  Patient: Leslie Evans  Procedure(s) Performed: EGD (ESOPHAGOGASTRODUODENOSCOPY) WITH BIOPSY (Mouth)  Patient Location: PACU  Anesthesia Type: General  Level of Consciousness: awake, alert  and patient cooperative  Airway and Oxygen Therapy: Patient Spontanous Breathing and Patient connected to supplemental oxygen  Post-op Assessment: Post-op Vital signs reviewed, Patient's Cardiovascular Status Stable, Respiratory Function Stable, Patent Airway and No signs of Nausea or vomiting  Post-op Vital Signs: Reviewed and stable  Complications: No notable events documented.

## 2024-01-27 NOTE — H&P (Signed)
 "  Leslie Schaffer, MD [facility] [address] [place], [place] [address] Phone:[phone] Fax : [phone]  Primary Care Physician:  Gareth Mliss FALCON, FNP Primary Gastroenterologist:  Dr. Schaffer  Pre-Procedure History & Physical: HPI:  Leslie Evans is a 69 y.o. female is here for an endoscopy as evaluation of nausea/vomiting persistent despite PPI. H/o Microscopic Colitis, treated with budesonide . Also takes pancreatic enzymes and zofran .  Prior procedure? 2022, negative for celiac or H. Pylori.  Blood thinners? 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   Pancreatitis    Panic attacks     Past Surgical History:  Procedure Laterality Date   ABDOMINAL HYSTERECTOMY     COLONOSCOPY WITH PROPOFOL  N/A 01/31/2020   Procedure: COLONOSCOPY WITH PROPOFOL ;  Surgeon: Unk Corinn Skiff, MD;  Location: [facility] ENDOSCOPY;  Service: Gastroenterology;  Laterality: N/A;   ESOPHAGOGASTRODUODENOSCOPY N/A 01/31/2020   Procedure: ESOPHAGOGASTRODUODENOSCOPY (EGD);  Surgeon: Unk Corinn Skiff, MD;  Location: [facility] ENDOSCOPY;  Service: Gastroenterology;  Laterality: N/A;   LAPAROTOMY  05/02/2022   Procedure: EXPLORATORY LAPAROTOMY;  Surgeon: Rodolph Romano, MD;  Location: [facility] ORS;  Service: General;;  with reduction of internal hernia   LYSIS OF ADHESION N/A 05/02/2022   Procedure: LYSIS OF ADHESION;  Surgeon: Rodolph Romano, MD;  Location: [facility] ORS;  Service: General;  Laterality: N/A;   TUBAL LIGATION      Prior to Admission medications  Medication Sig Start Date End Date Taking? Authorizing Provider  acetaminophen   (TYLENOL ) 500 MG tablet Take 500 mg by mouth every 6 (six) hours as needed.   Yes [provider]  albuterol  (VENTOLIN  HFA) 108 (90 Base) MCG/ACT inhaler Inhale 1-2 puffs into the lungs every 4 (four) hours as needed for wheezing or shortness of breath. 12/09/23  Yes Pender, Julie F, FNP  ALPRAZolam  (XANAX ) 0.5 MG tablet Take 1 tablet (0.5 mg total) by mouth 3 (three) times daily as needed for anxiety. 11/17/22  Yes Gareth Mliss FALCON, FNP  budesonide  (ENTOCORT EC ) 3 MG 24 hr capsule Take 2 capsules (6 mg total) by mouth daily. 06/18/22  Yes Vanga, Corinn Skiff, MD  budesonide -formoterol  (SYMBICORT ) 160-4.5 MCG/ACT inhaler Inhale 2 puffs into the lungs 2 (two) times daily. 05/20/23  Yes Gareth Mliss FALCON, FNP  Calcium -Magnesium-Vitamin D  (CALCIUM  1200+D3 PO) Take 1 tablet by mouth daily. 1200 MG AND 1000 iu   Yes [provider]  citalopram  (CELEXA ) 40 MG tablet Take 1 tablet (40 mg total) by mouth daily. 05/20/23  Yes Gareth Mliss FALCON, FNP  hydrOXYzine  (VISTARIL ) 25 MG capsule TAKE 1 TO 2 CAPSULES BY MOUTH TWICE DAILY AS NEEDED FOR INSOMNIA FOR ANXIETY 01/25/24  Yes Pender, Julie F, FNP  lisinopril  (ZESTRIL ) 40 MG tablet Take 1 tablet (40 mg total) by mouth daily. 05/20/23  Yes Pender, Julie F, FNP  metoCLOPramide  (REGLAN ) 10 MG tablet Take 1 tablet (10 mg total) by mouth every 6 (six) hours as needed for nausea or vomiting. 01/25/24  Yes Gareth Mliss FALCON, FNP  ondansetron  (ZOFRAN -ODT) 4 MG disintegrating tablet Take  1 tablet (4 mg total) by mouth every 8 (eight) hours as needed for nausea or vomiting. 01/25/24  Yes Gareth Mliss FALCON, FNP  Pancrelipase , Lip-Prot-Amyl, (ZENPEP ) 40000-126000 units CPEP Take 2 capsules with the first bite of each meal and 1 capsule with the first bite of each snack 08/06/23  Yes Jinny Carmine, MD  rosuvastatin  (CRESTOR ) 5 MG tablet Take 1 tablet (5 mg total) by mouth daily. 01/25/24  Yes Pender, Julie F, FNP  tiZANidine  (ZANAFLEX ) 4 MG tablet Take 0.5-1 tablets (2-4 mg  total) by mouth every 8 (eight) hours as needed for muscle spasms (muscle tightness). 01/25/24  Yes Pender, Julie F, FNP  vitamin E 1000 UNIT capsule Take 1,000 Units by mouth once a week.   Yes [provider]  meloxicam  (MOBIC ) 15 MG tablet Take 1 tablet (15 mg total) by mouth daily. Patient not taking: Reported on 01/25/2024 12/09/23   Gareth Mliss FALCON, FNP  nicotine  (NICODERM CQ  - DOSED IN MG/24 HOURS) 21 mg/24hr patch Place 1 patch (21 mg total) onto the skin daily. Patient not taking: Reported on 01/25/2024 12/09/23   Gareth Mliss FALCON, FNP  pantoprazole  (PROTONIX ) 40 MG tablet Take 1 tablet (40 mg total) by mouth daily. 05/20/23   Gareth Mliss FALCON, FNP    Allergies as of 12/14/2023 - Review Complete 12/14/2023  Allergen Reaction Noted   Lamisil  [terbinafine ] Anaphylaxis 11/27/2014   Pregabalin Other (See Comments) 08/21/2014   Tetracycline Other (See Comments) 08/21/2014   Gabapentin Dermatitis 08/21/2014   Ibuprofen Nausea And Vomiting 10/09/2015    Family History  Problem Relation Age of Onset   Breast cancer Mother    Cancer Mother    Diabetes Mother    Heart disease Mother    Cancer Father    Diabetes Maternal Grandmother    COPD Maternal Grandfather    Drug abuse Brother    Cancer Brother     Social History   Socioeconomic History   Marital status: Divorced    Spouse name: Not on file   Number of children: 3   Years of education: Not on file   Highest education level: Not on file  Occupational History   Occupation: [employer]    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 [place]   Tobacco Use   Smoking status: Every Day    Current packs/day: 0.50    Types: Cigarettes   Smokeless tobacco: Never  Vaping Use   Vaping status: Some Days   Substances: THC, Flavoring  Substance and Sexual Activity   Alcohol use: Yes    Comment: rarely   Drug use: Yes    Types: Marijuana    Comment: currently   01/23/2024   Sexual activity: Not Currently  Other Topics Concern   Not on file   Social History Narrative   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[place],  Patient stated no insurance   Social Drivers of Health   Tobacco Use: High Risk (01/27/2024)   Patient History    Smoking Tobacco Use: Every Day    Smokeless Tobacco Use: Never    Passive Exposure: Not on file  Financial Resource Strain: Medium Risk (09/10/2023)   Overall Financial Resource Strain (CARDIA)    Difficulty of Paying Living Expenses: Somewhat hard  Food Insecurity: No Food Insecurity (09/10/2023)   Epic    Worried About Radiation Protection Practitioner of Food in the Last Year: Never true    Ran Out of Food in the Last Year: Never true  Transportation Needs: No Transportation Needs (09/10/2023)  Epic    Lack of Transportation (Medical): No    Lack of Transportation (Non-Medical): No  Physical Activity: Sufficiently Active (09/10/2023)   Exercise Vital Sign    Days of Exercise per Week: 7 days    Minutes of Exercise per Session: 30 min  Stress: No Stress Concern Present (09/10/2023)   [employer] of Occupational Health - Occupational Stress Questionnaire    Feeling of Stress: Only a little  Social Connections: Socially Isolated (09/10/2023)   Social Connection and Isolation Panel    Frequency of Communication with Friends and Family: More than three times a week    Frequency of Social Gatherings with Friends and Family: Twice a week    Attends Religious Services: Never    Database Administrator or Organizations: No    Attends Banker Meetings: Never    Marital Status: Widowed  Intimate Partner Violence: Not At Risk (09/10/2023)   Epic    Fear of Current or Ex-Partner: No    Emotionally Abused: No    Physically Abused: No    Sexually Abused: No  Depression (PHQ2-9): Low Risk (12/09/2023)   Depression (PHQ2-9)    PHQ-2 Score: 2  Alcohol Screen: Low Risk (09/10/2023)   Alcohol Screen    Last Alcohol Screening Score (AUDIT): 1  Housing: Unknown (09/10/2023)   Epic    Unable to Pay for Housing in the Last Year: No     Number of Times Moved in the Last Year: Not on file    Homeless in the Last Year: No  Utilities: Not At Risk (09/10/2023)   Epic    Threatened with loss of utilities: No  Health Literacy: Adequate Health Literacy (09/10/2023)   B1300 Health Literacy    Frequency of need for help with medical instructions: Never    Review of Systems: See HPI, otherwise negative ROS  Physical Exam: BP (!) 147/93   Pulse 64   Temp 98.4 F (36.9 C) (Temporal)   Resp 16   Ht 5' 6 (1.676 m)   Wt 47.2 kg   SpO2 94%   BMI 16.79 kg/m  CONSTITUTIONAL: Well-appearing in no acute distress.  HEENT: Pupils equal, round, Extraocular movements intact. Conjunctivae clear NECK: Neck supple CARDIOVASCULAR: Regular rate, no LE edema  RESPIRATORY: No labored breathing  ABDOMEN: Abdomen soft, nontender, not distended, no guarding, no rigidity SKIN: No apparent skin rashes or lesions. NEUROLOGIC: Normal speech, no focal findings. Mental status alert and oriented x4. PSYCHIATRIC: Mood and affect normal.   Impression/Plan: Inocente DEL Vanwagner is here for an endoscopy to be performed for as evaluation of nausea/vomiting persistent despite PPI. H/o Microscopic Colitis, treated with budesonide . Also takes pancreatic enzymes and zofran .  Risks, benefits, limitations, and alternatives regarding  endoscopy have been reviewed with the patient.  Questions have been answered.  All parties agreeable.   MELANY Leslie HERO, MD  01/27/2024, 10:47 AM  "

## 2024-01-27 NOTE — Anesthesia Postprocedure Evaluation (Signed)
"   Anesthesia Post Note  Patient: Leslie Evans  Procedure(s) Performed: EGD (ESOPHAGOGASTRODUODENOSCOPY) WITH BIOPSY (Mouth)  Patient location during evaluation: PACU Anesthesia Type: General Level of consciousness: awake and alert Pain management: pain level controlled Vital Signs Assessment: post-procedure vital signs reviewed and stable Respiratory status: spontaneous breathing, nonlabored ventilation, respiratory function stable and patient connected to nasal cannula oxygen Cardiovascular status: blood pressure returned to baseline and stable Postop Assessment: no apparent nausea or vomiting Anesthetic complications: no   No notable events documented.   Last Vitals:  Vitals:   01/27/24 1027 01/27/24 1127  BP: (!) 147/93 101/69  Pulse: 64 (!) 56  Resp: 16 (!) 22  Temp: 36.9 C 37 C  SpO2: 94% 96%    Last Pain:  Vitals:   01/27/24 1127  TempSrc:   PainSc: Asleep                 Fairy A Westlyn Glaza      "

## 2024-01-27 NOTE — Anesthesia Preprocedure Evaluation (Signed)
"                                    Anesthesia Evaluation  Patient identified by MRN, date of birth, ID band Patient awake    Reviewed: Allergy & Precautions, H&P , NPO status , Patient's Chart, lab work & pertinent test results  Airway Mallampati: II  TM Distance: >3 FB Neck ROM: Full    Dental no notable dental hx. (+) Teeth Intact   Pulmonary COPD, Current Smoker   Pulmonary exam normal breath sounds clear to auscultation       Cardiovascular hypertension, negative cardio ROS Normal cardiovascular exam Rhythm:Regular Rate:Normal     Neuro/Psych negative neurological ROS  negative psych ROS   GI/Hepatic negative GI ROS, Neg liver ROS,,,  Endo/Other  negative endocrine ROS    Renal/GU negative Renal ROS  negative genitourinary   Musculoskeletal negative musculoskeletal ROS (+)    Abdominal   Peds negative pediatric ROS (+)  Hematology negative hematology ROS (+)   Anesthesia Other Findings   Reproductive/Obstetrics negative OB ROS                              Anesthesia Physical Anesthesia Plan  ASA: 3  Anesthesia Plan: General   Post-op Pain Management:    Induction: Intravenous  PONV Risk Score and Plan:   Airway Management Planned:   Additional Equipment:   Intra-op Plan:   Post-operative Plan: Extubation in OR  Informed Consent: I have reviewed the patients History and Physical, chart, labs and discussed the procedure including the risks, benefits and alternatives for the proposed anesthesia with the patient or authorized representative who has indicated his/her understanding and acceptance.     Dental advisory given  Plan Discussed with: CRNA  Anesthesia Plan Comments:         Anesthesia Quick Evaluation  "

## 2024-01-28 ENCOUNTER — Ambulatory Visit
Admission: RE | Admit: 2024-01-28 | Discharge: 2024-01-28 | Disposition: A | Discharge status: Home / Self Care | Attending: Gastroenterology | Admitting: Gastroenterology

## 2024-01-28 ENCOUNTER — Other Ambulatory Visit: Payer: Self-pay

## 2024-01-28 ENCOUNTER — Ambulatory Visit (INDEPENDENT_AMBULATORY_CARE_PROVIDER_SITE_OTHER): Admitting: Gastroenterology

## 2024-01-28 ENCOUNTER — Ambulatory Visit
Admission: RE | Admit: 2024-01-28 | Discharge: 2024-01-28 | Disposition: A | Discharge status: Home / Self Care | Source: Ambulatory Visit | Attending: Gastroenterology | Admitting: Gastroenterology

## 2024-01-28 VITALS — BP 154/77 | HR 62 | Temp 98.0°F | Ht 66.0 in | Wt 101.8 lb

## 2024-01-28 DIAGNOSIS — K52831 Collagenous colitis: Secondary | ICD-10-CM | POA: Diagnosis not present

## 2024-01-28 DIAGNOSIS — R112 Nausea with vomiting, unspecified: Secondary | ICD-10-CM

## 2024-01-28 DIAGNOSIS — E46 Unspecified protein-calorie malnutrition: Secondary | ICD-10-CM | POA: Diagnosis not present

## 2024-01-28 DIAGNOSIS — I10 Essential (primary) hypertension: Secondary | ICD-10-CM

## 2024-01-28 DIAGNOSIS — Z681 Body mass index (BMI) 19 or less, adult: Secondary | ICD-10-CM

## 2024-01-28 DIAGNOSIS — R197 Diarrhea, unspecified: Secondary | ICD-10-CM

## 2024-01-28 DIAGNOSIS — K219 Gastro-esophageal reflux disease without esophagitis: Secondary | ICD-10-CM

## 2024-01-28 DIAGNOSIS — Z720 Tobacco use: Secondary | ICD-10-CM

## 2024-01-28 DIAGNOSIS — F17210 Nicotine dependence, cigarettes, uncomplicated: Secondary | ICD-10-CM | POA: Diagnosis not present

## 2024-01-28 DIAGNOSIS — R634 Abnormal weight loss: Secondary | ICD-10-CM

## 2024-01-28 MED ORDER — LISINOPRIL 40 MG PO TABS: 40.0000 mg | ORAL_TABLET | Freq: Every day | ORAL | 3 refills | Status: AC

## 2024-01-28 MED ORDER — PANTOPRAZOLE SODIUM 40 MG PO TBEC: 40.0000 mg | DELAYED_RELEASE_TABLET | Freq: Every day | ORAL | 3 refills | Status: AC

## 2024-01-28 NOTE — Progress Notes (Signed)
 "   Primary Care Physician: Gareth Mliss FALCON, FNP  Primary Gastroenterologist:  Dr. [name]  Chief Complaint  Patient presents with   Follow-up    Patient reports after 1 yr of being on Zenpep , she still has not gained weight and little to no improvement with Sx     HPI: Leslie Evans is a 69 y.o. female here to discuss chronic nausea and vomiting.   Leslie Evans is a smoker, has COPD, is malnourished with a BMI of 16.4, and was diagnosed with collagenous colitis in 2022.  She was treated with budesonide  and is unable to completely wean, taking 3 mg daily.  At around that same time, while having watery diarrhea, she was tested for pancreatic insufficiency and had a fecal elastase of 50 (normal being 200).  She was given a diagnosis of EPI and started on Zenpep .  (She also carries a diagnosis of pancreatitis in epic, but this appears to be coming out of the diagnosis of EPI.  She does not have a history of medical encounters for episodes of acute pancreatitis or diabetes.).  She frequently takes Zofran  for nausea, which works well.  She has a lot of mucus in her throat and gags a lot.  She takes pantoprazole , though she has been dosing at bedtime.  She has lost weight for unclear reasons, total of 30 to 40 pounds.  She is currently at 102 pounds.  She was supplementing with Ensure, but has lost 2 jobs because she has diarrhea, is exhausted, and cannot stand long enough to complete her jobs or even to the [employer] for herself.  She has a history of chronic anxiety and panic attacks.  She has also lost Medicaid.  Her food stores are nonexistent.  She snacks on peanut butter sandwiches.  A recent CT scan of the abdomen and pelvis was negative.  The pancreas had normal morphology.  I met her for the first time in the endoscopy unit yesterday, where she was scheduled for EGD.  Biopsies for celiac, H. pylori and EOE have not yet been resulted.   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     albuterol  (VENTOLIN  HFA) 108 (90 Base) MCG/ACT inhaler Inhale 1-2 puffs into the lungs every 4 (four) hours as needed for wheezing or shortness of breath. 18 g 5   ALPRAZolam  (XANAX ) 0.5 MG tablet Take 1 tablet (0.5 mg total) by mouth 3 (three) times daily as needed for anxiety. 90 tablet 1   budesonide  (ENTOCORT EC ) 3 MG 24 hr capsule Take 2 capsules (6 mg total) by mouth daily. 180 capsule 1   budesonide -formoterol  (SYMBICORT ) 160-4.5 MCG/ACT inhaler Inhale 2 puffs into the lungs 2 (two) times daily. 10.2 g 3   Calcium -Magnesium-Vitamin D  (CALCIUM  1200+D3 PO) Take 1 tablet by mouth daily. 1200 MG AND 1000  iu     citalopram  (CELEXA ) 40 MG tablet Take 1 tablet (40 mg total) by mouth daily. 90 tablet 3   hydrOXYzine  (VISTARIL ) 25 MG capsule TAKE 1 TO 2 CAPSULES BY MOUTH TWICE DAILY AS NEEDED FOR INSOMNIA FOR ANXIETY 120 capsule 1   lisinopril  (ZESTRIL ) 40 MG tablet Take 1 tablet (40 mg total) by mouth daily. 90 tablet 3   metoCLOPramide  (REGLAN ) 10 MG tablet Take 1 tablet (10 mg total) by mouth every 6 (six) hours as needed for nausea or vomiting. 30 tablet 0   ondansetron  (ZOFRAN -ODT) 4 MG disintegrating tablet Take 1 tablet (4 mg total) by mouth every 8 (eight) hours as needed for nausea or vomiting. 20 tablet 0   Pancrelipase , Lip-Prot-Amyl, (ZENPEP ) 40000-126000 units CPEP Take 2 capsules with the first bite of each meal and 1 capsule with the first bite of  each snack 240 capsule 5   pantoprazole  (PROTONIX ) 40 MG tablet Take 1 tablet (40 mg total) by mouth daily. 90 tablet 3   rosuvastatin  (CRESTOR ) 5 MG tablet Take 1 tablet (5 mg total) by mouth daily. 90 tablet 1   tiZANidine  (ZANAFLEX ) 4 MG tablet Take 0.5-1 tablets (2-4 mg total) by mouth every 8 (eight) hours as needed for muscle spasms (muscle tightness). 90 tablet 1   vitamin E 1000 UNIT capsule Take 1,000 Units by mouth once a week.     No current facility-administered medications for this visit.    Allergies as of 01/28/2024 - Review Complete 01/28/2024  Allergen Reaction Noted   Lamisil  [terbinafine ] Anaphylaxis 11/27/2014   Pregabalin Other (See Comments) 08/21/2014   Tetracycline Other (See Comments) 08/21/2014   Gabapentin Dermatitis 08/21/2014   Ibuprofen Nausea And Vomiting 10/09/2015    ROS:  General: Negative for anorexia, weight loss, fever, chills, fatigue, weakness. ENT: Negative for hoarseness, difficulty swallowing , nasal congestion. CV: Negative for chest pain, angina, palpitations, dyspnea on exertion, peripheral edema.  Respiratory: Negative for dyspnea at rest, dyspnea on exertion, cough, sputum, wheezing.  GI: See history of present illness. GU:  Negative for dysuria, hematuria, urinary incontinence, urinary frequency, nocturnal urination.  Endo: Negative for unusual weight change.    Physical Examination:   BP (!) 154/77 (BP Location: Left Arm, Patient Position: Sitting, Cuff Size: Normal)   Pulse 62   Temp 98 F (36.7 C) (Oral)   Ht 5' 6 (1.676 m)   Wt 101 lb 12.8 oz (46.2 kg)   BMI 16.43 kg/m   General: Well-nourished, well-developed in no acute distress.  Eyes: No icterus. Conjunctivae pink. Lungs: Clear to auscultation bilaterally. Non-labored. Heart: Regular rate and rhythm, no murmurs rubs or gallops.  Abdomen: Bowel sounds are normal, nontender, nondistended, no hepatosplenomegaly or masses, no abdominal bruits or hernia , no rebound or  guarding.   Extremities: No lower extremity edema. No clubbing or deformities. Neuro: Alert and oriented x 3.  Grossly intact. Skin: Warm and dry, no jaundice.   Psych: Alert and cooperative, normal mood and affect.  Labs:  Reviewed and notable for elevated hemoglobin, presumed secondary to cigarette smoking, because iron studies in 7/22 were normal in the setting of hemoglobin 16.8.  Imaging Studies: CT of the abdomen and pelvis dated 05/08/2022 reviewed and negative.  Assessment and Plan:   Leslie Evans is a 69 y.o. y/o female with malnutrition, severe underweight, tobacco abuse, COPD, collagenous colitis, chronic anxiety, and nausea and vomiting.  We just did EGD yesterday with pan biopsies.  The pathology has not returned yet.  I think her diagnosis of EPI is incorrect because she does not have any history of pancreatitis or diabetes.  I suspect it is based solely on the presence of diarrhea and a fecal elastase of 50, though this was likely due to the delusional effect of watery diarrhea.  I have asked her to stop her Zenpep .  Plan KUB to see if she has constipation with overflow diarrhea.  She is currently taking her pantoprazole  at night.  She needs to switch it to morning, 30 to 60 minutes before breakfast to receive any benefit.  She will do that now.  She must stop smoking, as this contributes to anxiety, gastritis, collagenous colitis, and increases her risk of lung and pancreatic cancer, as well as exacerbates her COPD.  Follow-up in 2 to 3 months.     [name], MD    Note: This dictation was prepared with Dragon dictation along with smaller phrase technology. Any transcriptional errors that result from this process are unintentional.   "

## 2024-01-30 LAB — VITAMIN E
Vitamin E (Alpha Tocopherol): 5.8 mg/L — ABNORMAL LOW (ref 9.0–29.0)
Vitamin E(Gamma Tocopherol): 0.6 mg/L (ref 0.5–4.9)

## 2024-02-01 NOTE — Telephone Encounter (Signed)
 Erroneous encounter

## 2024-02-02 ENCOUNTER — Ambulatory Visit: Payer: Self-pay | Admitting: Gastroenterology

## 2024-02-02 LAB — SURGICAL PATHOLOGY

## 2024-02-05 ENCOUNTER — Ambulatory Visit: Payer: Self-pay

## 2024-02-06 LAB — VITAMIN A: Vitamin A: 41.3 ug/dL (ref 22.0–69.5)

## 2024-02-06 LAB — VITAMIN D 1,25 DIHYDROXY
Vitamin D 1, 25 (OH)2 Total: 48 pg/mL
Vitamin D2 1, 25 (OH)2: <10 pg/mL
Vitamin D3 1, 25 (OH)2: 48 pg/mL

## 2024-02-06 LAB — VITAMIN K1, SERUM: VITAMIN K1: 0.24 ng/mL (ref 0.10–2.20)

## 2024-02-07 ENCOUNTER — Ambulatory Visit: Payer: Self-pay | Admitting: Family Medicine

## 2024-04-28 ENCOUNTER — Ambulatory Visit: Admitting: Gastroenterology

## 2024-06-08 ENCOUNTER — Ambulatory Visit: Admitting: Nurse Practitioner

## 2024-09-15 ENCOUNTER — Ambulatory Visit
